# Patient Record
Sex: Male | Born: 2000
Health system: Southern US, Community
[De-identification: ages and names within clinical notes are randomized; demographics above are authoritative.]

## PROBLEM LIST (undated history)

## (undated) DIAGNOSIS — J4550 Severe persistent asthma, uncomplicated: Secondary | ICD-10-CM

## (undated) DIAGNOSIS — F259 Schizoaffective disorder, unspecified: Secondary | ICD-10-CM

## (undated) DIAGNOSIS — E669 Obesity, unspecified: Secondary | ICD-10-CM

## (undated) DIAGNOSIS — J309 Allergic rhinitis, unspecified: Secondary | ICD-10-CM

## (undated) DIAGNOSIS — J4540 Moderate persistent asthma, uncomplicated: Secondary | ICD-10-CM

## (undated) DIAGNOSIS — J351 Hypertrophy of tonsils: Secondary | ICD-10-CM

## (undated) DIAGNOSIS — R030 Elevated blood-pressure reading, without diagnosis of hypertension: Secondary | ICD-10-CM

## (undated) DIAGNOSIS — G4733 Obstructive sleep apnea (adult) (pediatric): Secondary | ICD-10-CM

## (undated) DIAGNOSIS — R7301 Impaired fasting glucose: Secondary | ICD-10-CM

## (undated) DIAGNOSIS — E66812 Obesity, class 2: Secondary | ICD-10-CM

## (undated) HISTORY — DX: Elevated blood-pressure reading, without diagnosis of hypertension: R03.0

## (undated) HISTORY — DX: Moderate persistent asthma, uncomplicated: J45.40

## (undated) HISTORY — DX: Allergic rhinitis, unspecified: J30.9

## (undated) HISTORY — DX: Obesity, class 2: E66.812

## (undated) HISTORY — DX: Impaired fasting glucose: R73.01

## (undated) HISTORY — DX: Obstructive sleep apnea (adult) (pediatric): G47.33

## (undated) HISTORY — DX: Severe persistent asthma, uncomplicated: J45.50

## (undated) HISTORY — DX: Hypertrophy of tonsils: J35.1

## (undated) HISTORY — DX: Obesity, unspecified: E66.9

## (undated) HISTORY — DX: Schizoaffective disorder, unspecified: F25.9

## (undated) NOTE — *Deleted (*Deleted)
Date:  03/17/2020 Time:  0900-1045 Group Topic/Focus: PROGRESSIVE RELAXATION. A group where deep breathing is taught and tensing and relaxation muscle groups is used. Imagery is used as well.  Pts are asked to imagine 3 pillars that hold them up when they are not able to hold themselves up.  Participation Level:  Active  Participation Quality:  Appropriate  Affect:  Appropriate  Cognitive:  Oriented  Insight: Improving  Engagement in Group:  Engaged  Modes of Intervention:  Activity, Discussion, Education, and Support  Additional Comments:  ***  Judge, Christine A 03/17/2020`  

---
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
## 2018-01-11 ENCOUNTER — Ambulatory Visit: Payer: 59 | Admitting: Allergy and Immunology

## 2018-01-11 DIAGNOSIS — J309 Allergic rhinitis, unspecified: Secondary | ICD-10-CM

## 2018-02-01 ENCOUNTER — Ambulatory Visit: Payer: 59 | Admitting: Neurology

## 2018-02-01 ENCOUNTER — Encounter: Payer: Self-pay | Admitting: Neurology

## 2018-02-01 VITALS — BP 142/87 | HR 95 | Ht 72.0 in | Wt 272.0 lb

## 2018-02-01 DIAGNOSIS — R51 Headache: Secondary | ICD-10-CM

## 2018-02-01 DIAGNOSIS — R519 Headache, unspecified: Secondary | ICD-10-CM

## 2018-02-01 DIAGNOSIS — R0683 Snoring: Secondary | ICD-10-CM | POA: Diagnosis not present

## 2018-02-01 DIAGNOSIS — G4733 Obstructive sleep apnea (adult) (pediatric): Secondary | ICD-10-CM

## 2018-02-01 DIAGNOSIS — E669 Obesity, unspecified: Secondary | ICD-10-CM

## 2018-02-01 DIAGNOSIS — J4520 Mild intermittent asthma, uncomplicated: Secondary | ICD-10-CM | POA: Diagnosis not present

## 2018-02-01 HISTORY — DX: Obstructive sleep apnea (adult) (pediatric): G47.33

## 2018-02-01 NOTE — Progress Notes (Addendum)
Subjective:    Patient ID: Nathaniel [name] is a 17 y.o. male.  HPI     Huston Foley, MD, PhD [facility] [address] [address] [place], [place] [address]  Dear Dr. Milinda Cave,   I saw your patient, Nathaniel [name], upon your kind request, in my sleep clinic for initial consultation of his sleep disorder, in particular, concern for underlying obstructive sleep apnea. The patient is unaccompanied today. As you know, [name] is a 17 year old right handed male with an underlying medical history of asthma, allergic rhinitis and obesity, who reports snoring and Difficulty with sleep initiation, some daytime tiredness reported. I reviewed your office note from 12/07/2017. His Epworth sleepiness score is 2 out of 24, fatigue score is 27/63. He is single, lives with his mother, works at [employer] as an Engineer, petroleum and has college classes Mondays, Wednesdays, and Fridays, 9-12 typically. He is a nonsmoker and does not utilize alcohol, drinks caffeine in the form of coffee, one cup per day on average, occasional tea, rare sodas. He has gained weight in the past couple of years. He has had very occasional mild her morning headaches, no history of migraines. He was diagnosed with asthma as a child. He has a family history of snoring but is not aware of any family history of OSA. He has had difficulty falling asleep for the past few years, maybe as long as 5 or 6 years even. Once he's asleep he is typically able to maintain sleep. He denies night to night nocturia or restless leg symptoms. He has tried melatonin and while it helps him go to sleep he has some daytime grogginess from it. He has a TV in his bedroom and typically falls asleep with his TV on. There are no pets in the household. He has no siblings. Bedtime may be around 11 or midnight, rise time varies depending on his work and class schedule. When he has to get up early for an appointment he feels  tired.  His Past Medical History Is Significant For: Past Medical History:  Diagnosis Date  . Allergic rhinitis   . Elevated blood pressure reading without diagnosis of hypertension   . IFG (impaired fasting glucose) 12/2017   Gluc 107, HbA1c 5.5%  . Moderate persistent asthma   . Obesity, Class II, BMI 35-39.9     His Past Surgical History Is Significant For: No past surgical history on file.  His Family History Is Significant For: Family History  Problem Relation Age of Onset  . Heart attack Other   . Prostate cancer Other   . Hypertension Father   . Hypertension Maternal Grandfather   . Cancer Maternal Grandfather   . Asthma Neg Hx     His Social History Is Significant For: Social History   Socioeconomic History  . Marital status: Single    Spouse name: Not on file  . Number of children: Not on file  . Years of education: Not on file  . Highest education level: Not on file  Occupational History  . Not on file  Social Needs  . Financial resource strain: Not on file  . Food insecurity:    Worry: Not on file    Inability: Not on file  . Transportation needs:    Medical: Not on file    Non-medical: Not on file  Tobacco Use  . Smoking status: Never Smoker  . Smokeless tobacco: Never Used  Substance and Sexual Activity  .  Alcohol use: No  . Drug use: No  . Sexual activity: Not on file  Lifestyle  . Physical activity:    Days per week: Not on file    Minutes per session: Not on file  . Stress: Not on file  Relationships  . Social connections:    Talks on phone: Not on file    Gets together: Not on file    Attends religious service: Not on file    Active member of club or organization: Not on file    Attends meetings of clubs or organizations: Not on file    Relationship status: Not on file  Other Topics Concern  . Not on file  Social History Narrative   Single, lives with mom in [place].   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   No T/A/Ds.    His Allergies Are:  Allergies  Allergen Reactions  . Amoxicillin Shortness Of Breath and Swelling  :   His Current Medications Are:  Outpatient Encounter Medications as of 02/01/2018  Medication Sig  . albuterol (PROVENTIL HFA;VENTOLIN HFA) 108 (90 BASE) MCG/ACT inhaler Inhale 2 puffs into the lungs every 6 (six) hours as needed. For wheezing or shortness of breath  . cetirizine (ZYRTEC) 10 MG tablet Take 10 mg by mouth daily.   . Cholecalciferol (VITAMIN D PO) Take 2,000 Units by mouth daily.  . Fluticasone Furoate (VERAMYST NA) Place 2 puffs into the nose daily as needed. For congestion  . Fluticasone-Salmeterol (ADVAIR DISKUS) 250-50 MCG/DOSE AEPB Inhale 1 puff into the lungs 2 (two) times daily.  . [DISCONTINUED] budesonide-formoterol (SYMBICORT) 160-4.5 MCG/ACT inhaler Inhale two puffs twice daily to prevent cough or wheeze. Rinse mouth after use.  . [DISCONTINUED] fluticasone furoate-vilanterol (BREO ELLIPTA) 100-25 MCG/INH AEPB Inhale 1 puff into the lungs daily.  . [DISCONTINUED] mometasone-formoterol (DULERA) 200-5 MCG/ACT AERO Inhale 2 puffs into the lungs 2 (two) times daily.   No facility-administered encounter medications on file as of 02/01/2018.   :  Review of Systems:  Out of a complete 14 point review of systems, all are reviewed and negative with the exception of these symptoms as listed below: Review of Systems  Neurological:       Pt presents today to discuss his sleep. Pt has never had a sleep study but does endorse snoring.  Epworth Sleepiness Scale 0= would never doze 1= slight chance of dozing 2= moderate chance of dozing 3= high chance of dozing  Sitting and reading: 0 Watching TV: 0 Sitting inactive in a public place (ex. Theater or meeting): 0 As a passenger in a car for an hour without a break: 1 Lying down to rest in the afternoon: 1 Sitting and talking to someone: 0 Sitting quietly after lunch (no alcohol): 0 In a  car, while stopped in traffic: 0 Total: 2     Objective:  Neurological Exam  Physical Exam Physical Examination:   Vitals:   02/01/18 1052  BP: (!) 142/87  Pulse: 95    General Examination: The patient is a very pleasant 17 y.o. male in no acute distress. He appears well-developed and well-nourished and well groomed.   HEENT: Normocephalic, atraumatic, pupils are equal, round and reactive to light and accommodation. Extraocular tracking is good without limitation to gaze excursion or nystagmus noted. Normal smooth pursuit is noted. Hearing is grossly intact. Face is symmetric with normal facial animation and normal facial sensation. Speech is clear with no dysarthria noted. There  is no hypophonia, in fact, speaks quite loudly. There is no lip, neck/head, jaw or voice tremor. Neck is supple with full range of passive and active motion. There are no carotid bruits on auscultation. Oropharynx exam reveals: no mouth dryness, adequate dental hygiene and moderate airway crowding, due to longer uvula, tonsillar size of 3+ on the right and 2+ on the left. Mallampati is class II. Neck circumference is 18-1/8 inches. Tongue protrudes centrally and palate elevates symmetrically.   Chest: Clear to auscultation without wheezing, rhonchi or crackles noted.  Heart: S1+S2+0, regular and normal without murmurs, rubs or gallops noted.   Abdomen: Soft, non-tender and non-distended with normal bowel sounds appreciated on auscultation.  Extremities: There is no pitting edema in the distal lower extremities bilaterally. Pedal pulses are intact.  Skin: Warm and dry without trophic changes noted. There are no varicose veins.  Musculoskeletal: exam reveals no obvious joint deformities, tenderness or joint swelling or erythema.   Neurologically:  Mental status: The patient is awake, alert and oriented in all 4 spheres. His immediate and remote memory, attention, language skills and fund of knowledge are  appropriate. There is no evidence of aphasia, agnosia, apraxia or anomia. Speech is clear with normal prosody and enunciation. Thought process is linear. Mood is normal and affect is normal.  Cranial nerves II - XII are as described above under HEENT exam. In addition: shoulder shrug is normal with equal shoulder height noted. Motor exam: Normal bulk, strength and tone is noted. There is no drift, tremor or rebound. Romberg is negative. Reflexes are 1-2+ throughout. Fine motor skills and coordination: intact with normal finger taps, normal hand movements, normal rapid alternating patting, normal foot taps and normal foot agility.  Cerebellar testing: No dysmetria or intention tremor on finger to nose testing. Heel to shin is unremarkable bilaterally. There is no truncal or gait ataxia.  Sensory exam: intact to light touch in the upper and lower extremities.  Gait, station and balance: He stands easily. No veering to one side is noted. No leaning to one side is noted. Posture is age-appropriate and stance is narrow based. Gait shows normal stride length and normal pace. No problems turning are noted. Tandem walk is unremarkable.   Assessment and Plan:  In summary, Jonluke Cobbins is a very pleasant 17 y.o.-year old male with an underlying medical history of asthma, allergic rhinitis and obesity, whose history and physical exam are concerning for obstructive sleep apnea (OSA). I had a long chat with the patient about my findings and the diagnosis of OSA, its prognosis and treatment options. We talked about medical treatments, surgical interventions and non-pharmacological approaches. I explained in particular the risks and ramifications of untreated moderate to severe OSA, especially with respect to developing cardiovascular disease down the Road, including congestive heart failure, difficult to treat hypertension, cardiac arrhythmias, or stroke. Even type 2 diabetes has, in part, been linked to untreated OSA.  Symptoms of untreated OSA include daytime sleepiness, memory problems, mood irritability and mood disorder such as depression and anxiety, lack of energy, as well as recurrent headaches, especially morning headaches. We talked about trying to maintain a healthy lifestyle in general, as well as the importance of weight control. I encouraged the patient to eat healthy, exercise daily and keep well hydrated, to keep a scheduled bedtime and wake time routine, to not skip any meals and eat healthy snacks in between meals. I advised the patient not to drive when feeling sleepy. I recommended the following at  this time: sleep study with potential positive airway pressure titration. (We will score hypopneas at 4%). He would like to think about it and then decide, would like to d/w his mother.  I explained the sleep test procedure to the patient and also outlined possible surgical and non-surgical treatment options of OSA. I answered all his questions today and the patient was in agreement. I plan to see him back after the sleep study is completed and encouraged him to call with any interim questions, concerns, problems or updates.   Thank you very much for allowing me to participate in the care of this nice patient. If I can be of any further assistance to you please do not hesitate to call me at [phone].  Sincerely,   Huston Foley, MD, PhD

## 2018-02-01 NOTE — Patient Instructions (Addendum)
Thank you for choosing [facility] for your sleep related care! It was nice to meet you today! I appreciate that you entrust me with your sleep related healthcare concerns. I hope, I was able to address at least some of your concerns today, and that I can help you feel reassured and also get better.    Here is what we discussed today and what we came up with as our plan for you:    Based on your symptoms and your exam I believe you may be at some risk for obstructive sleep apnea (aka OSA), and I think we should proceed with a sleep study to determine whether you do or do not have OSA and how severe it is. Even, if you have mild OSA, I may want you to consider treatment with CPAP, as treatment of even borderline or mild sleep apnea can result and improvement of symptoms such as sleep disruption, daytime sleepiness, nighttime bathroom breaks, restless leg symptoms, improvement of headache syndromes, even improved mood disorder.   Please remember, the long-term risks and ramifications of untreated moderate to severe obstructive sleep apnea are: increased Cardiovascular disease, including congestive heart failure, stroke, difficult to control hypertension, treatment resistant obesity, arrhythmias, especially irregular heartbeat commonly known as A. Fib. (atrial fibrillation); even type 2 diabetes has been linked to untreated OSA.   Sleep apnea can cause disruption of sleep and sleep deprivation in most cases, which, in turn, can cause recurrent headaches, problems with memory, mood, concentration, focus, and vigilance. Most people with untreated sleep apnea report excessive daytime sleepiness, which can affect their ability to drive. Please do not drive if you feel sleepy. Patients with sleep apnea developed difficulty initiating and maintaining sleep (aka insomnia).   Having sleep apnea may increase your risk for other sleep disorders, including involuntary behaviors sleep such as sleep  terrors, sleep talking, sleepwalking.    Having sleep apnea can also increase your risk for restless leg syndrome and leg movements at night.   Please note that untreated obstructive sleep apnea may carry additional perioperative morbidity. Patients with significant obstructive sleep apnea (typically, in the moderate to severe degree) should receive, if possible, perioperative PAP (positive airway pressure) therapy and the surgeons and particularly the anesthesiologists should be informed of the diagnosis and the severity of the sleep disordered breathing.   I will likely see you back after your sleep study to go over the test results and where to go from there. We will call you after your sleep study to advise about the results (most likely, you will hear from Kristen, my nurse) and to set up an appointment at the time, as necessary.    Our sleep lab administrative assistant will call you to schedule your sleep study and give you further instructions, regarding the check in process for the sleep study, arrival time, what to bring, when you can expect to leave after the study, etc., and to answer any other logistical questions you may have. If you don't hear back from her by about 2 weeks from now, please feel free to call her direct line at [phone] or you can call our general clinic number, or email us through My Chart.   

## 2018-02-02 ENCOUNTER — Telehealth: Payer: Self-pay

## 2018-02-02 DIAGNOSIS — R0683 Snoring: Secondary | ICD-10-CM

## 2018-02-02 NOTE — Telephone Encounter (Signed)
[employer] will deny in lab sleep study, does not meet criteria. Need HST order

## 2018-02-02 NOTE — Telephone Encounter (Signed)
VO for HST from Dr. Athar received. HST order placed.  

## 2018-02-03 ENCOUNTER — Encounter: Payer: Self-pay | Admitting: Family Medicine

## 2018-02-27 ENCOUNTER — Encounter: Payer: Self-pay | Admitting: Family Medicine

## 2018-02-27 DIAGNOSIS — G4733 Obstructive sleep apnea (adult) (pediatric): Secondary | ICD-10-CM

## 2018-02-27 DIAGNOSIS — J351 Hypertrophy of tonsils: Secondary | ICD-10-CM

## 2018-02-27 DIAGNOSIS — R0683 Snoring: Secondary | ICD-10-CM

## 2018-02-28 NOTE — Telephone Encounter (Signed)
Please advise. Thanks.  

## 2018-03-01 ENCOUNTER — Encounter: Payer: Self-pay | Admitting: Family Medicine

## 2018-03-01 NOTE — Telephone Encounter (Signed)
I've ordered a referral to ENT for this problem.-thx

## 2018-03-22 DIAGNOSIS — R0981 Nasal congestion: Secondary | ICD-10-CM | POA: Diagnosis not present

## 2018-03-22 DIAGNOSIS — J343 Hypertrophy of nasal turbinates: Secondary | ICD-10-CM | POA: Insufficient documentation

## 2018-03-22 DIAGNOSIS — G4733 Obstructive sleep apnea (adult) (pediatric): Secondary | ICD-10-CM | POA: Insufficient documentation

## 2018-03-22 DIAGNOSIS — J351 Hypertrophy of tonsils: Secondary | ICD-10-CM | POA: Diagnosis not present

## 2018-04-12 ENCOUNTER — Telehealth: Payer: Self-pay

## 2018-04-12 NOTE — Telephone Encounter (Signed)
We have attempted to call the patient two times to schedule sleep study.  Patient has been unavailable at the phone numbers we have on file and has not returned our calls.  At this point we will send a letter asking patient to please contact the sleep lab to schedule their sleep study.  If patient calls back we will schedule them for their sleep study. 

## 2018-04-14 ENCOUNTER — Other Ambulatory Visit: Payer: Self-pay | Admitting: Allergy and Immunology

## 2018-04-17 ENCOUNTER — Encounter: Payer: Self-pay | Admitting: Family Medicine

## 2018-04-20 ENCOUNTER — Other Ambulatory Visit (HOSPITAL_COMMUNITY)
Admission: RE | Admit: 2018-04-20 | Discharge: 2018-04-20 | Disposition: A | Discharge status: Home / Self Care | Payer: 59 | Source: Ambulatory Visit | Attending: Family Medicine | Admitting: Family Medicine

## 2018-04-20 ENCOUNTER — Encounter: Payer: Self-pay | Admitting: Family Medicine

## 2018-04-20 ENCOUNTER — Ambulatory Visit: Payer: 59 | Admitting: Family Medicine

## 2018-04-20 VITALS — BP 121/76 | HR 101 | Temp 98.3°F | Resp 16 | Ht 72.0 in | Wt 279.0 lb

## 2018-04-20 DIAGNOSIS — G4733 Obstructive sleep apnea (adult) (pediatric): Secondary | ICD-10-CM | POA: Diagnosis not present

## 2018-04-20 DIAGNOSIS — Z7251 High risk heterosexual behavior: Secondary | ICD-10-CM

## 2018-04-20 NOTE — Progress Notes (Signed)
OFFICE VISIT  04/20/2018   CC:  Chief Complaint  Patient presents with  . Testing for STDs    HPI:    Patient is a 17 y.o.  male who presents for STD testing. Has had only one unprotected sexual encounter and wants to be sure. Male partner.  She had no s/s of STD and denied hx of STD. He has had some intermittent pain in tip of penis when urinating but not consistently. No discharge.  No GU lesions or LAD.  Past Medical History:  Diagnosis Date  . Allergic rhinitis   . Chronic tonsillar hypertrophy    Referred to ENT 03/01/18  . Elevated blood pressure reading without diagnosis of hypertension   . IFG (impaired fasting glucose) 12/2017   Gluc 107, HbA1c 5.5%  . Moderate persistent asthma   . Obesity, Class II, BMI 35-39.9   . OSA (obstructive sleep apnea) 02/2018   Sleep study recommended by Dr. Ivory BroadAthar;pt considering it.    History reviewed. No pertinent surgical history.  Outpatient Medications Prior to Visit  Medication Sig Dispense Refill  . ADVAIR DISKUS 250-50 MCG/DOSE AEPB TAKE 1 PUFF BY MOUTH TWICE A DAY 60 each 0  . albuterol (PROVENTIL HFA;VENTOLIN HFA) 108 (90 BASE) MCG/ACT inhaler Inhale 2 puffs into the lungs every 6 (six) hours as needed. For wheezing or shortness of breath    . cetirizine (ZYRTEC) 10 MG tablet Take 10 mg by mouth daily.     . Cholecalciferol (VITAMIN D PO) Take 2,000 Units by mouth daily.    . mometasone (NASONEX) 50 MCG/ACT nasal spray Place 1 spray into the nose daily.    . Fluticasone Furoate (VERAMYST NA) Place 2 puffs into the nose daily as needed. For congestion     No facility-administered medications prior to visit.     Allergies  Allergen Reactions  . Amoxicillin Shortness Of Breath and Swelling    ROS As per HPI  PE: Blood pressure 121/76, pulse (!) 101, temperature 98.3 F (36.8 C), temperature source Oral, resp. rate 16, height 6' (1.829 m), weight 279 lb (126.6 kg), SpO2 97 %. Gen: Alert, well appearing.  Patient  is oriented to person, place, time, and situation. AFFECT: pleasant, lucid thought and speech. No further exam today.  LABS:    Chemistry      Component Value Date/Time   NA 142 12/07/2017 1030   K 4.4 12/07/2017 1030   CL 109 12/07/2017 1030   CO2 26 12/07/2017 1030   BUN 14 12/07/2017 1030   CREATININE 0.80 12/07/2017 1030   CREATININE 0.71 08/27/2014 1624      Component Value Date/Time   CALCIUM 9.9 12/07/2017 1030   ALKPHOS 76 12/07/2017 1030   AST 21 12/07/2017 1030   ALT 39 12/07/2017 1030   BILITOT 0.4 12/07/2017 1030     Lab Results  Component Value Date   HGBA1C 5.5 12/08/2017   Lab Results  Component Value Date   WBC 11.6 12/07/2017   HGB 14.5 12/07/2017   HCT 42.5 12/07/2017   MCV 88.4 12/07/2017   PLT 276.0 12/07/2017    IMPRESSION AND PLAN:  Unprotected sex. Pt states he was a virgin prior to this incident. He desires STD testing so I ordered urine GC/Chlamydia, RPR, and HIV. Recommended he use condom for protection at every sexual encounter.  An After Visit Summary was printed and given to the patient.  FOLLOW UP: Return for as needed.  Signed:  Santiago BumpersPhil Abbi Mancini, MD  04/20/2018     

## 2018-04-21 LAB — URINE CYTOLOGY ANCILLARY ONLY
Chlamydia: NEGATIVE
Neisseria Gonorrhea: NEGATIVE

## 2018-04-21 LAB — HIV ANTIBODY (ROUTINE TESTING W REFLEX): HIV 1&2 Ab, 4th Generation: NONREACTIVE

## 2018-04-21 LAB — RPR: RPR Ser Ql: NONREACTIVE

## 2018-04-22 ENCOUNTER — Encounter: Payer: Self-pay | Admitting: *Deleted

## 2018-05-12 ENCOUNTER — Encounter: Payer: Self-pay | Admitting: Family Medicine

## 2018-05-12 ENCOUNTER — Encounter: Payer: Self-pay | Admitting: *Deleted

## 2018-05-12 ENCOUNTER — Ambulatory Visit: Payer: 59 | Admitting: Family Medicine

## 2018-05-12 VITALS — BP 131/85 | HR 105 | Temp 99.7°F | Resp 16 | Ht 72.0 in | Wt 284.0 lb

## 2018-05-12 DIAGNOSIS — J209 Acute bronchitis, unspecified: Secondary | ICD-10-CM | POA: Diagnosis not present

## 2018-05-12 DIAGNOSIS — J111 Influenza due to unidentified influenza virus with other respiratory manifestations: Secondary | ICD-10-CM

## 2018-05-12 DIAGNOSIS — R6889 Other general symptoms and signs: Secondary | ICD-10-CM

## 2018-05-12 DIAGNOSIS — R69 Illness, unspecified: Principal | ICD-10-CM

## 2018-05-12 LAB — POC INFLUENZA A&B (BINAX/QUICKVUE)
INFLUENZA A, POC: NEGATIVE
INFLUENZA B, POC: NEGATIVE

## 2018-05-12 LAB — POCT RAPID STREP A (OFFICE): Rapid Strep A Screen: NEGATIVE

## 2018-05-12 MED ORDER — PREDNISONE 20 MG PO TABS: ORAL_TABLET | ORAL | 0 refills | Status: DC

## 2018-05-12 NOTE — Patient Instructions (Signed)
Get otc generic robitussin DM OR Mucinex DM and use as directed on the packaging for cough and congestion. Use otc generic saline nasal spray 2-3 times per day to irrigate/moisturize your nasal passages.  Drink lots of clear fluids!

## 2018-05-12 NOTE — Progress Notes (Signed)
OFFICE VISIT  05/12/2018   CC:  Chief Complaint  Patient presents with  . URI    cough, sore throat, congestion, fever, chills   HPI:    Patient is a 18 y.o.  male with moderate persistent asthma who presents for respiratory complaints. Onset f/c/malaise/fatigue 3 d/a.  This all subsided the next day, but he started getting nasal congestion, ST, runny nose, HA.  He then began getting a lot of coughing.  Some nausea but no vomiting.  No diarrhea.  No rash. Drinking fluids well.  No known sick contacts. Has taken "cold and flu" med otc, + ibup.  He has a tonsillectomy scheduled for tomorrow that he'll have to cancel.   Past Medical History:  Diagnosis Date  . Allergic rhinitis   . Chronic tonsillar hypertrophy    Referred to ENT 03/01/18  . Elevated blood pressure reading without diagnosis of hypertension   . IFG (impaired fasting glucose) 12/2017   Gluc 107, HbA1c 5.5%  . Moderate persistent asthma   . Obesity, Class II, BMI 35-39.9   . OSA (obstructive sleep apnea) 02/2018   Sleep study recommended by Dr. Ivory Broad considering it.    History reviewed. No pertinent surgical history.  Outpatient Medications Prior to Visit  Medication Sig Dispense Refill  . ADVAIR DISKUS 250-50 MCG/DOSE AEPB TAKE 1 PUFF BY MOUTH TWICE A DAY 60 each 0  . albuterol (PROVENTIL HFA;VENTOLIN HFA) 108 (90 BASE) MCG/ACT inhaler Inhale 2 puffs into the lungs every 6 (six) hours as needed. For wheezing or shortness of breath    . cetirizine (ZYRTEC) 10 MG tablet Take 10 mg by mouth daily.     . Cholecalciferol (VITAMIN D PO) Take 2,000 Units by mouth daily.    . mometasone (NASONEX) 50 MCG/ACT nasal spray Place 1 spray into the nose daily.     No facility-administered medications prior to visit.     Allergies  Allergen Reactions  . Amoxicillin Shortness Of Breath and Swelling    ROS As per HPI  PE: Blood pressure 131/85, pulse (!) 105, temperature 99.7 F (37.6 C), temperature source Oral,  resp. rate 16, height 6' (1.829 m), weight 284 lb (128.8 kg), SpO2 98 %. Body mass index is 38.52 kg/m.  VS: noted--normal. Gen: alert, NAD, NONTOXIC APPEARING. HEENT: eyes without injection, drainage, or swelling.  Ears: EACs clear, TMs with normal light reflex and landmarks.  Nose: Clear rhinorrhea, with some dried, crusty exudate adherent to mildly injected mucosa.  No purulent d/c.  No paranasal sinus TTP.  No facial swelling.  Throat and mouth without focal lesion.  No pharyngial swelling, erythema, or exudate.   Neck: supple, no LAD.   LUNGS: CTA bilat, nonlabored resps.  With forced exp maneuver he has coarse rhonchi.  Aeration is good. CV: RRR, no m/r/g. EXT: no c/c/e SKIN: no rash   LABS:    Chemistry      Component Value Date/Time   NA 142 12/07/2017 1030   K 4.4 12/07/2017 1030   CL 109 12/07/2017 1030   CO2 26 12/07/2017 1030   BUN 14 12/07/2017 1030   CREATININE 0.80 12/07/2017 1030   CREATININE 0.71 08/27/2014 1624      Component Value Date/Time   CALCIUM 9.9 12/07/2017 1030   ALKPHOS 76 12/07/2017 1030   AST 21 12/07/2017 1030   ALT 39 12/07/2017 1030   BILITOT 0.4 12/07/2017 1030     Strep today: neg Flu A/B today: neg  IMPRESSION AND PLAN:  Influenza-like  illness, flu and strep neg. He has some RAD-->prednisone 40mg  qd x 5d rx'd today. Albut hfa 2p q4h. Get otc generic robitussin DM OR Mucinex DM and use as directed on the packaging for cough and congestion. Use otc generic saline nasal spray 2-3 times per day to irrigate/moisturize your nasal passages. Push fluids, rest. Signs/symptoms to call or return for were reviewed and pt expressed understanding.  An After Visit Summary was printed and given to the patient.  FOLLOW UP: Return if symptoms worsen or fail to improve.  Signed:  Santiago Bumpers, MD           05/12/2018

## 2018-06-03 ENCOUNTER — Other Ambulatory Visit: Payer: Self-pay | Admitting: Otolaryngology

## 2018-06-03 DIAGNOSIS — G4733 Obstructive sleep apnea (adult) (pediatric): Secondary | ICD-10-CM | POA: Diagnosis not present

## 2018-06-03 DIAGNOSIS — J3501 Chronic tonsillitis: Secondary | ICD-10-CM | POA: Diagnosis not present

## 2018-06-03 HISTORY — PX: TONSILLECTOMY: SUR1361

## 2018-06-23 DIAGNOSIS — J3089 Other allergic rhinitis: Secondary | ICD-10-CM | POA: Diagnosis not present

## 2018-07-12 ENCOUNTER — Telehealth: Payer: Self-pay

## 2018-07-12 ENCOUNTER — Other Ambulatory Visit: Payer: Self-pay | Admitting: *Deleted

## 2018-07-12 MED ORDER — FLUTICASONE-SALMETEROL 250-50 MCG/DOSE IN AEPB: INHALATION_SPRAY | RESPIRATORY_TRACT | 0 refills | Status: DC

## 2018-07-12 NOTE — Telephone Encounter (Signed)
A courtesy refill has been sent in for Advair to requested pharmacy. Called and informed mother, mother verbalized understanding and will ensure that Kenet makes it to his appointment.

## 2018-07-12 NOTE — Telephone Encounter (Signed)
Patients mom is calling to see if she can get a refill on her sons Advair. She made a follow up for 08/02/18 with Dr Lucie Leather.   [facility] [address]

## 2018-07-19 ENCOUNTER — Other Ambulatory Visit: Payer: Self-pay

## 2018-07-19 ENCOUNTER — Encounter: Payer: Self-pay | Admitting: Family Medicine

## 2018-07-19 ENCOUNTER — Ambulatory Visit: Payer: 59 | Admitting: Family Medicine

## 2018-07-19 VITALS — BP 143/64 | HR 99 | Temp 98.3°F | Resp 19 | Ht 72.0 in | Wt 289.2 lb

## 2018-07-19 DIAGNOSIS — J4531 Mild persistent asthma with (acute) exacerbation: Secondary | ICD-10-CM | POA: Diagnosis not present

## 2018-07-19 DIAGNOSIS — R062 Wheezing: Secondary | ICD-10-CM

## 2018-07-19 MED ORDER — ALBUTEROL SULFATE HFA 108 (90 BASE) MCG/ACT IN AERS: 2.0000 | INHALATION_SPRAY | Freq: Four times a day (QID) | RESPIRATORY_TRACT | 0 refills | Status: DC | PRN

## 2018-07-19 MED ORDER — AZITHROMYCIN 250 MG PO TABS: ORAL_TABLET | ORAL | 0 refills | Status: DC

## 2018-07-19 MED ORDER — IPRATROPIUM-ALBUTEROL 0.5-2.5 (3) MG/3ML IN SOLN
3.0000 mL | Freq: Once | RESPIRATORY_TRACT | Status: AC
  Administered 2018-07-19: 3 mL via RESPIRATORY_TRACT

## 2018-07-19 MED ORDER — PREDNISONE 20 MG PO TABS: ORAL_TABLET | ORAL | 0 refills | Status: DC

## 2018-07-19 NOTE — Patient Instructions (Signed)
Rest, hydrate.  + flonase, mucinex DM if cough Z-pack and prednisone  prescribed, take until completed.  Continue advair- refilled albuterol (use every 6 hr 1-2 puffs as needed during illness) If cough present it can last up to 6-8 weeks.  F/U 2 weeks of not improved.    Asthma, Adult  Asthma is a long-term (chronic) condition in which the airways get tight and narrow. The airways are the breathing passages that lead from the nose and mouth down into the lungs. A person with asthma will have times when symptoms get worse. These are called asthma attacks. They can cause coughing, whistling sounds when you breathe (wheezing), shortness of breath, and chest pain. They can make it hard to breathe. There is no cure for asthma, but medicines and lifestyle changes can help control it. There are many things that can bring on an asthma attack or make asthma symptoms worse (triggers). Common triggers include:  Mold.  Dust.  Cigarette smoke.  Cockroaches.  Things that can cause allergy symptoms (allergens). These include animal skin flakes (dander) and pollen from trees or grass.  Things that pollute the air. These may include household cleaners, wood smoke, smog, or chemical odors.  Cold air, weather changes, and wind.  Crying or laughing hard.  Stress.  Certain medicines or drugs.  Certain foods such as dried fruit, potato chips, and grape juice.  Infections, such as a cold or the flu.  Certain medical conditions or diseases.  Exercise or tiring activities. Asthma may be treated with medicines and by staying away from the things that cause asthma attacks. Types of medicines may include:  Controller medicines. These help prevent asthma symptoms. They are usually taken every day.  Fast-acting reliever or rescue medicines. These quickly relieve asthma symptoms. They are used as needed and provide short-term relief.  Allergy medicines if your attacks are brought on by allergens.   Medicines to help control the body's defense (immune) system. Follow these instructions at home: Avoiding triggers in your home  Change your heating and air conditioning filter often.  Limit your use of fireplaces and wood stoves.  Get rid of pests (such as roaches and mice) and their droppings.  Throw away plants if you see mold on them.  Clean your floors. Dust regularly. Use cleaning products that do not smell.  Have someone vacuum when you are not home. Use a vacuum cleaner with a HEPA filter if possible.  Replace carpet with wood, tile, or vinyl flooring. Carpet can trap animal skin flakes and dust.  Use allergy-proof pillows, mattress covers, and box spring covers.  Wash bed sheets and blankets every week in hot water. Dry them in a dryer.  Keep your bedroom free of any triggers.  Avoid pets and keep windows closed when things that cause allergy symptoms are in the air.  Use blankets that are made of polyester or cotton.  Clean bathrooms and kitchens with bleach. If possible, have someone repaint the walls in these rooms with mold-resistant paint. Keep out of the rooms that are being cleaned and painted.  Wash your hands often with soap and water. If soap and water are not available, use hand sanitizer.  Do not allow anyone to smoke in your home. General instructions  Take over-the-counter and prescription medicines only as told by your doctor. ? Talk with your doctor if you have questions about how or when to take your medicines. ? Make note if you need to use your medicines more often than  usual.  Do not use any products that contain nicotine or tobacco, such as cigarettes and e-cigarettes. If you need help quitting, ask your doctor.  Stay away from secondhand smoke.  Avoid doing things outdoors when allergen counts are high and when air quality is low.  Wear a ski mask when doing outdoor activities in the winter. The mask should cover your nose and mouth.  Exercise indoors on cold days if you can.  Warm up before you exercise. Take time to cool down after exercise.  Use a peak flow meter as told by your doctor. A peak flow meter is a tool that measures how well the lungs are working.  Keep track of the peak flow meter's readings. Write them down.  Follow your asthma action plan. This is a written plan for taking care of your asthma and treating your attacks.  Make sure you get all the shots (vaccines) that your doctor recommends. Ask your doctor about a flu shot and a pneumonia shot.  Keep all follow-up visits as told by your doctor. This is important. Contact a doctor if:  You have wheezing, shortness of breath, or a cough even while taking medicine to prevent attacks.  The mucus you cough up (sputum) is thicker than usual.  The mucus you cough up changes from clear or white to yellow, green, gray, or bloody.  You have problems from the medicine you are taking, such as: ? A rash. ? Itching. ? Swelling. ? Trouble breathing.  You need reliever medicines more than 2-3 times a week.  Your peak flow reading is still at 50-79% of your personal best after following the action plan for 1 hour.  You have a fever. Get help right away if:  You seem to be worse and are not responding to medicine during an asthma attack.  You are short of breath even at rest.  You get short of breath when doing very little activity.  You have trouble eating, drinking, or talking.  You have chest pain or tightness.  You have a fast heartbeat.  Your lips or fingernails start to turn blue.  You are light-headed or dizzy, or you faint.  Your peak flow is less than 50% of your personal best.  You feel too tired to breathe normally. Summary  Asthma is a long-term (chronic) condition in which the airways get tight and narrow. An asthma attack can make it hard to breathe.  Asthma cannot be cured, but medicines and lifestyle changes can help control  it.  Make sure you understand how to avoid triggers and how and when to use your medicines. This information is not intended to replace advice given to you by your health care provider. Make sure you discuss any questions you have with your health care provider. Document Released: 10/07/2007 Document Revised: 05/25/2016 Document Reviewed: 05/25/2016 Elsevier Interactive Patient Education  2019 [employer].

## 2018-07-19 NOTE — Progress Notes (Signed)
Nathaniel [name] , 06/21/2000, 18 y.o., male MRN: [id number] Patient Care Team    Relationship Specialty Notifications Start End  McGowen, Maryjean Morn, MD PCP - General Family Medicine  09/06/17   Jessica Priest, MD Consulting Physician Allergy and Immunology  09/06/17   Alfonse Spruce, MD Consulting Physician Allergy and Immunology  09/30/17   Huston Foley, MD Consulting Physician Neurology  02/03/18     Chief Complaint  Patient presents with  . Cough    x2 days, Denies fever, SOB, travel   . Sore Throat     Subjective: Pt presents for an OV with complaints of sore throat and cough of 2 days duration.  Associated symptoms include runny nose, cough is dry. He has a h/o asthma- he has not used his inhaler. He endorses headache, ear pressure.  Flu shot UTD 12/2017. He denies any travel or exposure to COVID19.  He denies fever, chills, nausea or vomit.  Pt has tried nothing to ease their symptoms.   Depression screen [facility] 2/9 12/07/2017 07/13/2017  Decreased Interest 0 0  Down, Depressed, Hopeless 0 0  PHQ - 2 Score 0 0    Allergies  Allergen Reactions  . Amoxicillin Shortness Of Breath and Swelling   Social History   Social History Narrative   Single, lives with mom in [place].   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   No T/A/Ds.   Past Medical History:  Diagnosis Date  . Allergic rhinitis   . Chronic tonsillar hypertrophy    Referred to ENT 03/01/18  . Elevated blood pressure reading without diagnosis of hypertension   . IFG (impaired fasting glucose) 12/2017   Gluc 107, HbA1c 5.5%  . Moderate persistent asthma   . Obesity, Class II, BMI 35-39.9   . OSA (obstructive sleep apnea) 02/2018   Sleep study recommended by Dr. Ivory Broad considering it.   History reviewed. No pertinent surgical history. Family History  Problem Relation Age of Onset  . Heart attack Other   . Prostate cancer Other   . Hypertension Father   . Hypertension Maternal  Grandfather   . Cancer Maternal Grandfather   . Asthma Neg Hx    Allergies as of 07/19/2018      Reactions   Amoxicillin Shortness Of Breath, Swelling      Medication List       Accurate as of July 19, 2018  5:17 PM. Always use your most recent med list.        albuterol 108 (90 Base) MCG/ACT inhaler Commonly known as:  PROVENTIL HFA;VENTOLIN HFA Inhale 2 puffs into the lungs every 6 (six) hours as needed. For wheezing or shortness of breath   azithromycin 250 MG tablet Commonly known as:  ZITHROMAX 500 mg day 1, then 250 mg QD   cetirizine 10 MG tablet Commonly known as:  ZYRTEC Take 10 mg by mouth daily.   Fluticasone-Salmeterol 250-50 MCG/DOSE Aepb Commonly known as:  Advair Diskus TAKE 1 PUFF BY MOUTH TWICE A DAY   predniSONE 20 MG tablet Commonly known as:  DELTASONE 2 tabs po qd x 5d   VITAMIN D PO Take 2,000 Units by mouth daily.       All past medical history, surgical history, allergies, family history, immunizations andmedications were updated in the EMR today and reviewed under the history and medication portions of their EMR.     ROS: Negative, with the exception of above mentioned in HPI  Objective:  BP (!) 143/64 (BP Location: Right Arm, Patient Position: Sitting, Cuff Size: Large)   Pulse 99   Temp 98.3 F (36.8 C) (Oral)   Resp 19   Ht 6' (1.829 m)   Wt 289 lb 4 oz (131.2 kg)   SpO2 99%   BMI 39.23 kg/m  Body mass index is 39.23 kg/m. Gen: Afebrile. No acute distress. Nontoxic in appearance, well developed, well nourished.  HENT: AT. [place]. Bilateral TM visualized without erythema or fullness. MMM, no oral lesions. Bilateral nares mild erythema, no swelling or drainage. Throat without erythema or exudates.  Cough present. Eyes:Pupils Equal Round Reactive to light, Extraocular movements intact,  Conjunctiva without redness, discharge or icterus. Neck/lymp/endocrine: Supple, no lymphadenopathy CV: RRR  Chest: mild wheeze bilaterally, no  crackles. Good air movement, normal resp effort.  Skin: no rashes, purpura or petechiae.  Neuro:  Normal gait. PERLA. EOMi. Alert. Oriented x3  No exam data present No results found. No results found for this or any previous visit (from the past 24 hour(s)).  Assessment/Plan: Torrin Kalicki is a 18 y.o. male present for OV for  Mild persistent asthma with exacerbation/Wheezing Rest, hydrate.  duoneb tx in office was helpful.  + flonase, mucinex DM if cough Z-pack and prednisone  prescribed, take until completed.  Continue advair- refilled albuterol (use every 6 hr 1-2 puffs as needed during illness) If cough present it can last up to 6-8 weeks.  F/U 2 weeks of not improved.    Reviewed expectations re: course of current medical issues.  Discussed self-management of symptoms.  Outlined signs and symptoms indicating need for more acute intervention.  Patient verbalized understanding and all questions were answered.  Patient received an After-Visit Summary.    No orders of the defined types were placed in this encounter.   > 25 minutes spent with patient, >50% of time spent face to face    Note is dictated utilizing voice recognition software. Although note has been proof read prior to signing, occasional typographical errors still can be missed. If any questions arise, please do not hesitate to call for verification.   electronically signed by:  Felix Pacini, DO   Primary Care - [place]

## 2018-08-02 ENCOUNTER — Encounter: Payer: Self-pay | Admitting: Allergy and Immunology

## 2018-08-02 ENCOUNTER — Other Ambulatory Visit: Payer: Self-pay

## 2018-08-02 ENCOUNTER — Ambulatory Visit: Payer: 59 | Admitting: Allergy and Immunology

## 2018-08-02 VITALS — BP 138/80 | HR 112 | Temp 98.2°F | Resp 18

## 2018-08-02 DIAGNOSIS — J4550 Severe persistent asthma, uncomplicated: Secondary | ICD-10-CM | POA: Diagnosis not present

## 2018-08-02 DIAGNOSIS — B349 Viral infection, unspecified: Secondary | ICD-10-CM | POA: Diagnosis not present

## 2018-08-02 DIAGNOSIS — J3089 Other allergic rhinitis: Secondary | ICD-10-CM

## 2018-08-02 NOTE — Patient Instructions (Addendum)
  1. Continue Advair 250- 1 (not two) - 2 times per day    2. Continue ProAir HFA 2 puffs every 4-6 hours if needed  3. Continue nasal fluticasone 1 spray each nostril 1-2 times a day during periods of upper airway symptoms  4. Continue Zyrtec 10 mg once a day if needed  5.  For this most recent episode utilize the following:   A.  OTC Mucinex DM 1-2 tablets 1-2 times per day (MAX = 4 tabs / day)  B.  Add Sample Spiriva 1.25 Respimat -2 inhalations 1 time per day  C. Add SAMPLE Qvar 80 redihaler - 2 inhalations 2 times per day  5. Return to clinic in 6 months or earlier if problem

## 2018-08-02 NOTE — Progress Notes (Signed)
[place] [facility] [place]   Follow-up Note  Referring Provider: Jeoffrey Massed, MD Primary Provider: Jeoffrey Massed, MD Date of Office Visit: 08/02/2018  Subjective:   Nathaniel [name] (DOB: 2000-11-07) is a 18 y.o. male who [facility] in re-evaluation of the following:  HPI: Nathaniel [name] presents to this clinic in evaluation of his asthma and allergic rhinitis.  I have not seen him in this clinic since 20 July 2017.  According to Nathaniel [name] he believes that his asthma has been under very good control.  Apparently he did develop a flulike illness back in January 2020 but otherwise felt as though his asthma rarely cause him any problem and did not require systemic steroid or an antibiotic to treat an exacerbation and rarely used a short acting bronchodilator.  Likewise, he had very little issues with his upper airways.  However, about 2 weeks ago he developed a sore throat followed by a cough for which he visited his doctor who told him that he had a virus and gave him a Z-Pak and prednisone.  He never completely resolved this issue and in fact he had bad body aches about 3 to 4 days ago which fortunately appears to have resolved today.  His coughing is actually better today.  He still has a little bit of shortness of breath.  He does not have any chest pain or sputum production.  He has very little upper airway symptoms at this point in time.  He does not have a sore throat at this point in time.  He did use a short acting bronchodilator the past several days which has helped him.  He has not really been around people who have been ill.  He uses Advair but there is some confusion about exactly how much he is using.  Apparently he is using a discus but he will increase his dose to 2 inhalations twice a day at times.  He has been on [place] and various other inhalers but because of insurance issues it is been very difficult  to have him use a consistent controller agent.  Allergies as of 08/02/2018      Reactions   Amoxicillin Shortness Of Breath, Swelling      Medication List      albuterol 108 (90 Base) MCG/ACT inhaler Commonly known as:  PROVENTIL HFA;VENTOLIN HFA Inhale 2 puffs into the lungs every 6 (six) hours as needed. For wheezing or shortness of breath   cetirizine 10 MG tablet Commonly known as:  ZYRTEC Take 10 mg by mouth daily.   Fluticasone-Salmeterol 250-50 MCG/DOSE Aepb Commonly known as:  Advair Diskus TAKE 1 PUFF BY MOUTH TWICE A DAY   VITAMIN D PO Take 2,000 Units by mouth daily.       Past Medical History:  Diagnosis Date  . Allergic rhinitis   . Chronic tonsillar hypertrophy    Referred to ENT 03/01/18  . Elevated blood pressure reading without diagnosis of hypertension   . IFG (impaired fasting glucose) 12/2017   Gluc 107, HbA1c 5.5%  . Moderate persistent asthma   . Obesity, Class II, BMI 35-39.9   . OSA (obstructive sleep apnea) 02/2018   Sleep study recommended by Dr. Ivory Broad considering it.    History reviewed. No pertinent surgical history.  Review of systems negative except as noted in HPI / PMHx or noted below:  Review of Systems  Constitutional: Negative.  HENT: Negative.   Eyes: Negative.   Respiratory: Negative.   Cardiovascular: Negative.   Gastrointestinal: Negative.   Genitourinary: Negative.   Musculoskeletal: Negative.   Skin: Negative.   Neurological: Negative.   Endo/Heme/Allergies: Negative.   Psychiatric/Behavioral: Negative.      Objective:   Vitals:   08/02/18 0939  BP: 138/80  Pulse: (!) 112  Resp: 18  Temp: 98.2 F (36.8 C)  SpO2: 96%          Physical Exam Constitutional:      Appearance: He is not diaphoretic.  HENT:     Head: Normocephalic.     Right Ear: Tympanic membrane, ear canal and external ear normal.     Left Ear: Tympanic membrane, ear canal and external ear normal.     Nose: Nose normal. No  mucosal edema or rhinorrhea.     Mouth/Throat:     Pharynx: Uvula midline. No oropharyngeal exudate.  Eyes:     Conjunctiva/sclera: Conjunctivae normal.  Neck:     Thyroid: No thyromegaly.     Trachea: Trachea normal. No tracheal tenderness or tracheal deviation.  Cardiovascular:     Rate and Rhythm: Normal rate and regular rhythm.     Heart sounds: Normal heart sounds, S1 normal and S2 normal. No murmur.  Pulmonary:     Effort: No respiratory distress.     Breath sounds: Normal breath sounds. No stridor. No wheezing or rales.  Lymphadenopathy:     Head:     Right side of head: No tonsillar adenopathy.     Left side of head: No tonsillar adenopathy.     Cervical: No cervical adenopathy.  Skin:    Findings: No erythema or rash.     Nails: There is no clubbing.   Neurological:     Mental Status: He is alert.     Diagnostics:    Spirometry was not performed  Assessment and Plan:   1. Not well controlled severe persistent asthma   2. Other allergic rhinitis   3. Viral infection     1. Continue Advair 250- 1 (not two) - 2 times per day    2. Continue ProAir HFA 2 puffs every 4-6 hours if needed  3. Continue nasal fluticasone 1 spray each nostril 1-2 times a day during periods of upper airway symptoms  4. Continue Zyrtec 10 mg once a day if needed  5.  For this most recent episode utilize the following:   A.  OTC Mucinex DM 1-2 tablets 1-2 times per day (MAX = 4 tabs / day)  B.  Add Sample Spiriva 1.25 Respimat -2 inhalations 1 time per day  C. Add SAMPLE Qvar 80 redihaler - 2 inhalations 2 times per day  5. Return to clinic in 6 months or earlier if problem  Nathaniel [name] appears to have inflammation of his airway most likely from a viral infection but fortunately he is not febrile at this point in time and he has improved significantly over the course of the past 24 to 48 hours.  I will give him additional anti-inflammatory agents for his airway as noted above assuming that  he will resolve this issue over the course of the next several weeks and continue to do well while just using Advair on a consistent basis.  I did emphasize that he should only use Advair at the dosage of 1 inhalation 2 times per day to avert the development of significant side effects by using more medications than indicated.  Assuming  he does well I will see him back in this clinic in 6 months or earlier if there is a problem.  Laurette Schimke, MD Allergy / Immunology [facility]

## 2018-08-03 ENCOUNTER — Encounter: Payer: Self-pay | Admitting: Allergy and Immunology

## 2018-08-04 ENCOUNTER — Other Ambulatory Visit: Payer: Self-pay | Admitting: Allergy & Immunology

## 2018-09-12 ENCOUNTER — Other Ambulatory Visit: Payer: Self-pay | Admitting: Family Medicine

## 2018-09-22 ENCOUNTER — Encounter: Payer: Self-pay | Admitting: Family Medicine

## 2019-01-03 ENCOUNTER — Ambulatory Visit: Payer: 59

## 2019-01-16 ENCOUNTER — Ambulatory Visit (INDEPENDENT_AMBULATORY_CARE_PROVIDER_SITE_OTHER): Payer: 59 | Admitting: Family Medicine

## 2019-01-16 ENCOUNTER — Other Ambulatory Visit: Payer: Self-pay

## 2019-01-16 ENCOUNTER — Encounter: Payer: Self-pay | Admitting: Family Medicine

## 2019-01-16 VITALS — Temp 99.4°F

## 2019-01-16 DIAGNOSIS — Z7189 Other specified counseling: Secondary | ICD-10-CM

## 2019-01-16 DIAGNOSIS — R509 Fever, unspecified: Secondary | ICD-10-CM | POA: Diagnosis not present

## 2019-01-16 DIAGNOSIS — R05 Cough: Secondary | ICD-10-CM | POA: Diagnosis not present

## 2019-01-16 DIAGNOSIS — R059 Cough, unspecified: Secondary | ICD-10-CM

## 2019-01-16 MED ORDER — BENZONATATE 100 MG PO CAPS: 200.0000 mg | ORAL_CAPSULE | Freq: Two times a day (BID) | ORAL | 0 refills | Status: DC | PRN

## 2019-01-16 MED ORDER — ONDANSETRON HCL 4 MG PO TABS: 4.0000 mg | ORAL_TABLET | Freq: Three times a day (TID) | ORAL | 0 refills | Status: DC | PRN

## 2019-01-16 NOTE — Patient Instructions (Signed)

## 2019-01-16 NOTE — Progress Notes (Signed)
VIRTUAL VISIT VIA VIDEO  I connected with Nathaniel [name] on 01/16/19 at  4:20 PM EDT by a video enabled telemedicine application and verified that I am speaking with the correct person using two identifiers. Location patient: Home Location provider: [facility], Office Persons participating in the virtual visit: Patient, Dr. Raoul Pitch and R.Baker, LPN  I discussed the limitations of evaluation and management by telemedicine and the availability of in person appointments. The patient expressed understanding and agreed to proceed.   SUBJECTIVE Chief Complaint  Patient presents with  . Fatigue    feels feverish and has chills. SOB. coughing, dry mostly. vomiting. hasn't eaten much and has some sinus pressure. diarrhea. mother currently has COVID. she tested positive this morning. patient has been sick for 1 week    HPI: Nathaniel [name] is a 18 y.o. male present for acute illness.  Patient reports he has had fevers, chills, mild short of breath, dry cough since last Friday.  Today he also had one episode of vomiting.  He had a fever of 99.4 F today.  He has extremely decreased appetite secondary to nausea.  He denies nasal congestion.  He admits to loose stools over this time period as well.  His mother tested positive for COVID-19, received her results today.  ROS: See pertinent positives and negatives per HPI.  Patient Active Problem List   Diagnosis Date Noted  . Sore throat 07/13/2017  . Hypogonadism male 04/02/2015  . Acquired acanthosis nigricans 11/27/2014  . Pre-diabetes 07/29/2013  . Morbid obesity ([place]) 07/29/2013  . Goiter 07/29/2013  . Dyspepsia 07/29/2013  . Essential hypertension, benign 07/29/2013  . Gynecomastia, male 07/29/2013  . PHARYNGITIS 04/16/2008  . Acute upper respiratory infection 04/16/2008  . INFLUENZA WITH OTHER RESPIRATORY MANIFESTATIONS 02/06/2008  . HERPETIC WHITLOW 12/12/2007  . COLD SORE 12/12/2007  . MIXED RECEPTIVE-EXPRESSIVE LANGUAGE DISORDER  12/12/2007  . CONSTIPATION 12/12/2007  . URTICARIA 12/12/2007  . UNSPECIFIED FETAL AND NEONATAL JAUNDICE 12/12/2007  . CARDIAC FLOW MURMUR 12/12/2007  . ALLERGIC RHINITIS 12/09/2007  . ASTHMA, MILD 12/09/2007    Social History   Tobacco Use  . Smoking status: Never Smoker  . Smokeless tobacco: Never Used  Substance Use Topics  . Alcohol use: No    Current Outpatient Medications:  .  cetirizine (ZYRTEC) 10 MG tablet, Take 10 mg by mouth daily. , Disp: , Rfl:  .  Cholecalciferol (VITAMIN D PO), Take 2,000 Units by mouth daily., Disp: , Rfl:  .  Fluticasone-Salmeterol (ADVAIR DISKUS) 250-50 MCG/DOSE AEPB, INHALE 1 PUFF BY MOUTH TWICE A DAY, Disp: 60 each, Rfl: 5 .  VENTOLIN HFA 108 (90 Base) MCG/ACT inhaler, INHALE 2 PUFFS INTO THE LUNGS EVERY 6 (SIX) HOURS AS NEEDED. FOR WHEEZING OR SHORTNESS OF BREATH, Disp: 18 Inhaler, Rfl: 0 .  azithromycin (ZITHROMAX) 250 MG tablet, 500 mg day 1, then 250 mg QD (Patient not taking: Reported on 08/02/2018), Disp: 6 tablet, Rfl: 0 .  predniSONE (DELTASONE) 20 MG tablet, 2 tabs po qd x 5d (Patient not taking: Reported on 08/02/2018), Disp: 10 tablet, Rfl: 0  Allergies  Allergen Reactions  . Amoxicillin Shortness Of Breath and Swelling    OBJECTIVE: Temp 99.4 F (37.4 C) (Oral)  Gen: No acute distress. Nontoxic in appearance.  Appears pale HENT: AT. [place].  MMM.  Eyes:Pupils Equal Round Reactive to light, Extraocular movements intact,  Conjunctiva without redness, discharge or icterus. CV: No edema Chest: Cough present, no shortness of breath present Skin: No rashes, purpura or  petechiae.  Neuro:  Alert. Oriented x3  Psych: Normal affect, dress and demeanor. Normal speech. Normal thought content and judgment.  ASSESSMENT AND PLAN: Nathaniel [name] is a 18 y.o. male present for  Cough/fever/COVID-19 education Rest, hydrate.  mucinex (DM if cough), nettie pot or nasal saline.  Zofran for nausea and Tessalon Perles for cough prescribed -COVID-19  education was provided along with self-isolation guidelines. - Novel Coronavirus, NAA ([facility]) -Follow-up dependent upon COVID results.  Patient was encouraged to self isolate.  If positive patient will need to remain in isolation for 10 days from onset of symptoms and he must be symptom-free for at least 72 hours without fever or cough before ending isolation. - He reports he does not need an excuse today because he is taking online classes only.  He understands instructions. -If symptoms are worsening with shortness of breath he is to report to emergency room.  Patient reports understanding.  > 15 minutes spent with patient, > 50% of that time face to face   Felix PaciniRenee Humbert Morozov, DO 01/16/2019

## 2019-01-17 ENCOUNTER — Encounter: Payer: 59 | Admitting: Family Medicine

## 2019-01-17 ENCOUNTER — Other Ambulatory Visit: Payer: Self-pay

## 2019-01-17 DIAGNOSIS — Z20822 Contact with and (suspected) exposure to covid-19: Secondary | ICD-10-CM

## 2019-01-19 ENCOUNTER — Telehealth: Payer: Self-pay | Admitting: Family Medicine

## 2019-01-19 LAB — NOVEL CORONAVIRUS, NAA: SARS-CoV-2, NAA: DETECTED — AB

## 2019-01-19 NOTE — Telephone Encounter (Signed)
Pt was called and given all information. He was advised if he experiences any SOB he needs to seek emergency help at hospital. Pt verbalized understanding

## 2019-01-19 NOTE — Telephone Encounter (Signed)
Please inform patient the following information: Patient's coronavirus test came back positive.   -He is to remain isolated for 10 days total from the time of his appointment which would be 9/24.  -  If all of his symptoms are resolved for at least 72 hours on 9/24 he can discontinue his isolation.  If he is having any symptoms remaining Monday or Tuesday of next week he should make an appointment to follow-up with his PCP for further instructions.

## 2019-01-23 ENCOUNTER — Telehealth: Payer: Self-pay | Admitting: Family Medicine

## 2019-01-23 ENCOUNTER — Telehealth: Payer: Self-pay | Admitting: *Deleted

## 2019-01-23 NOTE — Telephone Encounter (Signed)
Pt was advised flu shot can be given during appt next month. Pt needs work note stating clear to resume normal activity. His employer is requiring this.  Okay for note?

## 2019-01-23 NOTE — Telephone Encounter (Signed)
Patient feels better after testing positive for COVID. Patient has been more than 10 days symptom free other than his sense of taste is not as strong. Patient would like to know if he is okay to resume his normal life. Patient would also like to know when it is okay to come in for his flu shot.

## 2019-01-23 NOTE — Telephone Encounter (Signed)
Pt given result; his symptoms have resolved, and he states that his only symptom is lost of smell; instructions given: [name] Instruct the patient to remain in self-quarantine until they meet the "Non-Test Criteria for Ending Self-Isolation". Non-Test Criteria for Ending Self-Isolation All persons with fever and respiratory symptoms should isolate themselves until ALL conditions listed below are met: - at least 10 days since symptoms onset - AND 3 consecutive days fever free without antipyretics (acetaminophen [Tylenol] or ibuprofen [Advil]) - AND improvement in respiratory symptoms . If the patient develops respiratory issues/distress, seek medical care in the Emergency Department, call 911, reports symptoms and report COVID-19 positive test. Patient Instructions . ontinue to utilize over-the-counter medications for fever (ibuprofen and/or Tylenol) and cough (cough medicine and/or sore throat lozenges). .  wear a mask around people and follow good infection prevention techniques. [name]  only leave home to seek medical care. . Instruct patient to send family for food, prescriptions or medicines; or use delivery service.  .  they must wear a mask in public. . Instruct patient to limit contact with immediate family members or caregivers in the home, and use mask, social distancing, and handwashing to decrease risk to patients. o Please continue good preventive care measures, including frequent hand washing, avoid touching your face, cover coughs/sneezes with tissue or into elbow, stay out of crowds and keep a 6-foot distance from others.   . Instruct patient and family to clean hard surfaces touched by patient frequently with household cleaning products.; .  . pt also instructed to notified his PCP; he also was informed the [facility] will be notified, and may contact him with additional recommendations' the pt verbalized understanding.

## 2019-01-23 NOTE — Telephone Encounter (Signed)
Yes, note for return to work is fine.-thx

## 2019-01-24 ENCOUNTER — Telehealth: Payer: Self-pay | Admitting: Allergy and Immunology

## 2019-01-24 NOTE — Telephone Encounter (Signed)
Work note was done and sent via email to patient.

## 2019-01-24 NOTE — Telephone Encounter (Signed)
Patient just tested positive to COVID on the 15th. Nathaniel [name] states we need to push his appoint out at [facility] another week and since he is under the care of his PCP then he should check with them about getting his Flu shot. I attempted to call patient to reschedule his appt but got no answer and was unable to leave a voicemail due to it being full.   I will cancel the appointment for now, can someone call to reschedule? Thanks!

## 2019-01-24 NOTE — Telephone Encounter (Signed)
Pt has a appointment on Tuesday and would like to get his flu shots. [phone].

## 2019-01-25 NOTE — Telephone Encounter (Signed)
I called the patient, per Lonn Georgia, to move appointment out 2 weeks due to the diagnosis of COVID. Patient said to cancel the appointment and he would call back when he got his new work schedule.

## 2019-01-25 NOTE — Telephone Encounter (Signed)
Please note where you spoke with patient and thank you for all that you do.

## 2019-01-27 ENCOUNTER — Other Ambulatory Visit: Payer: Self-pay

## 2019-01-27 ENCOUNTER — Ambulatory Visit (INDEPENDENT_AMBULATORY_CARE_PROVIDER_SITE_OTHER): Payer: 59

## 2019-01-27 DIAGNOSIS — Z23 Encounter for immunization: Secondary | ICD-10-CM

## 2019-01-31 ENCOUNTER — Ambulatory Visit: Payer: 59 | Admitting: Allergy and Immunology

## 2019-02-21 ENCOUNTER — Other Ambulatory Visit: Payer: Self-pay | Admitting: Allergy and Immunology

## 2019-02-28 ENCOUNTER — Ambulatory Visit (INDEPENDENT_AMBULATORY_CARE_PROVIDER_SITE_OTHER): Payer: 59 | Admitting: Family Medicine

## 2019-02-28 ENCOUNTER — Encounter: Payer: Self-pay | Admitting: Family Medicine

## 2019-02-28 ENCOUNTER — Other Ambulatory Visit: Payer: Self-pay

## 2019-02-28 VITALS — BP 136/79 | HR 95 | Temp 97.8°F | Resp 16 | Ht 72.0 in | Wt 305.8 lb

## 2019-02-28 DIAGNOSIS — E669 Obesity, unspecified: Secondary | ICD-10-CM | POA: Diagnosis not present

## 2019-02-28 DIAGNOSIS — R030 Elevated blood-pressure reading, without diagnosis of hypertension: Secondary | ICD-10-CM | POA: Diagnosis not present

## 2019-02-28 DIAGNOSIS — Z0000 Encounter for general adult medical examination without abnormal findings: Secondary | ICD-10-CM

## 2019-02-28 DIAGNOSIS — M549 Dorsalgia, unspecified: Secondary | ICD-10-CM

## 2019-02-28 DIAGNOSIS — G8929 Other chronic pain: Secondary | ICD-10-CM

## 2019-02-28 DIAGNOSIS — G4733 Obstructive sleep apnea (adult) (pediatric): Secondary | ICD-10-CM | POA: Diagnosis not present

## 2019-02-28 DIAGNOSIS — M419 Scoliosis, unspecified: Secondary | ICD-10-CM

## 2019-02-28 DIAGNOSIS — R7301 Impaired fasting glucose: Secondary | ICD-10-CM | POA: Diagnosis not present

## 2019-02-28 LAB — CBC WITH DIFFERENTIAL/PLATELET
Basophils Absolute: 0.2 10*3/uL — ABNORMAL HIGH (ref 0.0–0.1)
Basophils Relative: 1.6 % (ref 0.0–3.0)
Eosinophils Absolute: 0.4 10*3/uL (ref 0.0–0.7)
Eosinophils Relative: 3.8 % (ref 0.0–5.0)
HCT: 43.7 % (ref 39.0–52.0)
Hemoglobin: 14.8 g/dL (ref 13.0–17.0)
Lymphocytes Relative: 44.8 % (ref 12.0–46.0)
Lymphs Abs: 4.2 10*3/uL — ABNORMAL HIGH (ref 0.7–4.0)
MCHC: 33.9 g/dL (ref 30.0–36.0)
MCV: 89 fl (ref 78.0–100.0)
Monocytes Absolute: 0.6 10*3/uL (ref 0.1–1.0)
Monocytes Relative: 6.2 % (ref 3.0–12.0)
Neutro Abs: 4.1 10*3/uL (ref 1.4–7.7)
Neutrophils Relative %: 43.6 % (ref 43.0–77.0)
Platelets: 304 10*3/uL (ref 150.0–400.0)
RBC: 4.91 Mil/uL (ref 4.22–5.81)
RDW: 14 % (ref 11.5–14.6)
WBC: 9.3 10*3/uL (ref 4.5–10.5)

## 2019-02-28 LAB — COMPREHENSIVE METABOLIC PANEL
ALT: 47 U/L (ref 0–53)
AST: 27 U/L (ref 0–37)
Albumin: 4.8 g/dL (ref 3.5–5.2)
Alkaline Phosphatase: 74 U/L (ref 39–117)
BUN: 11 mg/dL (ref 6–23)
CO2: 29 mEq/L (ref 19–32)
Calcium: 10.3 mg/dL (ref 8.4–10.5)
Chloride: 106 mEq/L (ref 96–112)
Creatinine, Ser: 0.66 mg/dL (ref 0.40–1.50)
GFR: 153.14 mL/min (ref >60.00)
Glucose, Bld: 101 mg/dL — ABNORMAL HIGH (ref 70–99)
Potassium: 5.1 mEq/L (ref 3.5–5.1)
Sodium: 141 mEq/L (ref 135–145)
Total Bilirubin: 0.5 mg/dL (ref 0.2–1.2)
Total Protein: 7.2 g/dL (ref 6.0–8.3)

## 2019-02-28 LAB — LIPID PANEL
Cholesterol: 154 mg/dL (ref 0–200)
HDL: 39.9 mg/dL (ref >39.00)
LDL Cholesterol: 92 mg/dL (ref 0–99)
NonHDL: 114.46
Total CHOL/HDL Ratio: 4
Triglycerides: 110 mg/dL (ref 0.0–149.0)
VLDL: 22 mg/dL (ref 0.0–40.0)

## 2019-02-28 LAB — HEMOGLOBIN A1C: Hgb A1c MFr Bld: 5.7 % (ref 4.6–6.5)

## 2019-02-28 NOTE — Progress Notes (Signed)
Office Note 02/28/2019  CC:  Chief Complaint  Patient presents with  . Annual Exam    pt is fasting    HPI:  Nathaniel [name] is a 18 y.o. male who is here for annual health maintenance exam.  Sleeping better since tonsillectomy.  Feels better after this regarding daytime energy level. Last couple months sleeping shorter amount of time, some waking up in mid sleep. He did not pursue the sleep study recommended by Dr. Frances Furbish.  He does no bp monitoring at home or elsewhere. He has gained a lot of wt since I last saw him, BMI over 40 now. He has ongoing mild chronic mid back pain, x-ray has shown mild scoliosis.  He is s/p a course of PT and still does home stretching but finds that the only relief from the pain is getting it "popped" by his mom. He asks if chiropractor would be ok.  He had covid 19 illness about 6 wks ago, +covid test 01/17/19, had uncomplicated illness and no asthma exacerbation/no steroids.  He has been back to feeling well for at least 1 mo.  Past Medical History:  Diagnosis Date  . Allergic rhinitis   . Chronic tonsillar hypertrophy    ENT did tonsillectomy 2020  . Elevated blood pressure reading without diagnosis of hypertension   . IFG (impaired fasting glucose) 12/2017   Gluc 107, HbA1c 5.5%  . Obesity, Class II, BMI 35-39.9   . OSA (obstructive sleep apnea) 02/2018   Sleep study recommended by Dr. Ivory Broad considering it.  . Severe persistent asthma    Dr. Sharyn Lull    Past Surgical History:  Procedure Laterality Date  . TONSILLECTOMY  06/03/2018    Family History  Problem Relation Age of Onset  . Heart attack Other   . Prostate cancer Other   . Hypertension Father   . Hypertension Maternal Grandfather   . Cancer Maternal Grandfather   . Asthma Neg Hx     Social History   Socioeconomic History  . Marital status: Single    Spouse name: Not on file  . Number of children: Not on file  . Years of education: Not on file  . Highest education  level: Not on file  Occupational History  . Not on file  Social Needs  . Financial resource strain: Not on file  . Food insecurity    Worry: Not on file    Inability: Not on file  . Transportation needs    Medical: Not on file    Non-medical: Not on file  Tobacco Use  . Smoking status: Never Smoker  . Smokeless tobacco: Never Used  Substance and Sexual Activity  . Alcohol use: No  . Drug use: No  . Sexual activity: Not on file  Lifestyle  . Physical activity    Days per week: Not on file    Minutes per session: Not on file  . Stress: Not on file  Relationships  . Social Musician on phone: Not on file    Gets together: Not on file    Attends religious service: Not on file    Active member of club or organization: Not on file    Attends meetings of clubs or organizations: Not on file    Relationship status: Not on file  . Intimate partner violence    Fear of current or ex partner: Not on file    Emotionally abused: Not on file    Physically abused: Not on file  Forced sexual activity: Not on file  Other Topics Concern  . Not on file  Social History Narrative   Single, lives with mom in [place].   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   No T/A/Ds.    Outpatient Medications Prior to Visit  Medication Sig Dispense Refill  . cetirizine (ZYRTEC) 10 MG tablet Take 10 mg by mouth daily.     . Cholecalciferol (VITAMIN D PO) Take 2,000 Units by mouth daily.    . Fluticasone-Salmeterol (ADVAIR DISKUS) 250-50 MCG/DOSE AEPB TAKE 1 PUFF BY MOUTH TWICE A DAY 60 each 0  . VENTOLIN HFA 108 (90 Base) MCG/ACT inhaler INHALE 2 PUFFS INTO THE LUNGS EVERY 6 (SIX) HOURS AS NEEDED. FOR WHEEZING OR SHORTNESS OF BREATH 18 Inhaler 0  . benzonatate (TESSALON) 100 MG capsule Take 2 capsules (200 mg total) by mouth 2 (two) times daily as needed for cough. (Patient not taking: Reported on 02/28/2019) 20 capsule 0  . ondansetron (ZOFRAN) 4 MG tablet  Take 1 tablet (4 mg total) by mouth every 8 (eight) hours as needed for nausea or vomiting. (Patient not taking: Reported on 02/28/2019) 20 tablet 0   No facility-administered medications prior to visit.     Allergies  Allergen Reactions  . Amoxicillin Shortness Of Breath and Swelling    ROS Review of Systems  Constitutional: Negative for appetite change, chills, fatigue and fever.  HENT: Negative for congestion, dental problem, ear pain and sore throat.   Eyes: Negative for discharge, redness and visual disturbance.  Respiratory: Negative for cough, chest tightness, shortness of breath and wheezing.   Cardiovascular: Negative for chest pain, palpitations and leg swelling.  Gastrointestinal: Negative for abdominal pain, blood in stool, diarrhea, nausea and vomiting.  Genitourinary: Negative for difficulty urinating, dysuria, flank pain, frequency, hematuria and urgency.  Musculoskeletal: Positive for back pain (see hpi). Negative for arthralgias, joint swelling, myalgias and neck stiffness.  Skin: Negative for pallor and rash.  Neurological: Negative for dizziness, speech difficulty, weakness and headaches.  Hematological: Negative for adenopathy. Does not bruise/bleed easily.  Psychiatric/Behavioral: Negative for confusion and sleep disturbance. The patient is not nervous/anxious.     PE; Blood pressure 136/79, pulse 95, temperature 97.8 F (36.6 C), temperature source Temporal, resp. rate 16, height 6' (1.829 m), weight (!) 305 lb 12.8 oz (138.7 kg), SpO2 98 %. Body mass index is 41.47 kg/m. Repeat bp today was 130/80 (initial was 136/79)  Gen: Alert, well appearing, obese-appearing.  Patient is oriented to person, place, time, and situation. AFFECT: pleasant, lucid thought and speech. ENT: Ears: EACs clear, normal epithelium.  TMs with good light reflex and landmarks bilaterally.  Eyes: no injection, icteris, swelling, or exudate.  EOMI, PERRLA. Nose: no drainage or turbinate  edema/swelling.  No injection or focal lesion.  Mouth: lips without lesion/swelling.  Oral mucosa pink and moist.  Dentition intact and without obvious caries or gingival swelling.  Oropharynx without erythema, exudate, or swelling.  Neck: supple/nontender.  No LAD, mass, or TM.  Carotid pulses 2+ bilaterally, without bruits. CV: RRR, no m/r/g.   LUNGS: CTA bilat, nonlabored resps, good aeration in all lung fields. ABD: soft, NT, ND, BS normal.  No hepatospenomegaly or mass.  No bruits. EXT: no clubbing, cyanosis, or edema.  Musculoskeletal: no joint swelling, erythema, warmth, or tenderness.  ROM of all joints intact. Skin - no sores or suspicious lesions or rashes or color changes   Pertinent labs:  Lab Results  Component Value Date   TSH 3.16 12/07/2017   Lab Results  Component Value Date   WBC 11.6 12/07/2017   HGB 14.5 12/07/2017   HCT 42.5 12/07/2017   MCV 88.4 12/07/2017   PLT 276.0 12/07/2017   Lab Results  Component Value Date   CREATININE 0.80 12/07/2017   BUN 14 12/07/2017   NA 142 12/07/2017   K 4.4 12/07/2017   CL 109 12/07/2017   CO2 26 12/07/2017   Lab Results  Component Value Date   ALT 39 12/07/2017   AST 21 12/07/2017   ALKPHOS 76 12/07/2017   BILITOT 0.4 12/07/2017   Lab Results  Component Value Date   CHOL 153 12/07/2017   Lab Results  Component Value Date   HDL 36.30 (L) 12/07/2017   Lab Results  Component Value Date   LDLCALC 94 12/07/2017   Lab Results  Component Value Date   TRIG 114.0 12/07/2017   Lab Results  Component Value Date   CHOLHDL 4 12/07/2017   Lab Results  Component Value Date   HGBA1C 5.5 12/08/2017   ASSESSMENT AND PLAN:   1) Morbid obesity: discussed need for TLC to lose wt: needs to get more aggressive with diet and START cardio exercise.  He is too busy as full time student and working full time so I'll hold off on non-surg bariatric clinic referral at this time.  2) Chronic mid back pain, hx of mild  scoliosis: PT and home stretching not very helpful. Manipulation of spine at home has seemed to help. REfer to chiropractor today. He had plain radiographs of thoracic and lumbar spine 09/07/2017.  3) OSA: question of.  Sleep study recommended by Dr. Frances FurbishAthar. He did get tonsillectomy since that time and says he feels better, but given his RFs of elev bp w/out dx of HTN and worsening obesity I recommended he contact Dr. Teofilo PodAthar's office to arrange the sleep study.  4) Covid 19 illness: 6 weeks ago.  No complications. Back to feeling well for 1 mo now.  5) Health maintenance exam: Reviewed age and gender appropriate health maintenance issues (prudent diet, regular exercise, health risks of tobacco and excessive alcohol, use of seatbelts, fire alarms in home, use of sunscreen).  Also reviewed age and gender appropriate health screening as well as vaccine recommendations. Vaccines: all UTD, including seasonal flu. Labs: fasting HP + A1c (IFG).  An After Visit Summary was printed and given to the patient.  FOLLOW UP:  Return in about 6 months (around 08/29/2019) for f/u obesity, bp, prediabetes.  Signed:  Santiago BumpersPhil Dabney Dever, MD           02/28/2019

## 2019-03-02 LAB — TSH: TSH: 2.74 u[IU]/mL (ref 0.35–5.50)

## 2019-05-22 ENCOUNTER — Encounter: Payer: Self-pay | Admitting: Family Medicine

## 2019-05-22 ENCOUNTER — Ambulatory Visit (INDEPENDENT_AMBULATORY_CARE_PROVIDER_SITE_OTHER): Payer: 59 | Admitting: Family Medicine

## 2019-05-22 ENCOUNTER — Other Ambulatory Visit: Payer: Self-pay

## 2019-05-22 DIAGNOSIS — B349 Viral infection, unspecified: Secondary | ICD-10-CM

## 2019-05-22 DIAGNOSIS — J4551 Severe persistent asthma with (acute) exacerbation: Secondary | ICD-10-CM | POA: Diagnosis not present

## 2019-05-22 DIAGNOSIS — J019 Acute sinusitis, unspecified: Secondary | ICD-10-CM

## 2019-05-22 MED ORDER — AZITHROMYCIN 250 MG PO TABS: ORAL_TABLET | ORAL | 0 refills | Status: DC

## 2019-05-22 MED ORDER — PREDNISONE 20 MG PO TABS: ORAL_TABLET | ORAL | 0 refills | Status: DC

## 2019-05-22 NOTE — Progress Notes (Signed)
Virtual Visit via Video Note  I connected with pt on 05/22/19 at 11:00 AM EST by a video enabled telemedicine application and verified that I am speaking with the correct person using two identifiers.  Location patient: home Location provider:work or home office Persons participating in the virtual visit: patient, provider  I discussed the limitations of evaluation and management by telemedicine and the availability of in person appointments. The patient expressed understanding and agreed to proceed.  Telemedicine visit is a necessity given the COVID-19 restrictions in place at the current time.  HPI: 19 y/o male being seen today for multiple symptoms->"sinus problems and generalized body aches". Onset about 6-7 days ago, ST initially.  Fatigued and more winded, some SOB would come and go. Albut helped sometimes but not others.  Felt hot and winded with moving around at work. No fevers.  HA in frontal sinus regions bilt, says some swelling in this region.  +cough, +nasal congestion, some mucous when he blows his nose-"greenish".  Taste and smell intact. 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 He has continued to go to work during this. Has not gotten covid test[facility]". Reports hx of covid illness 01/2019, + test 01/17/19. Has tried otc fluticasone qd lately and this has helped some.  ROS: See pertinent positives and negatives per HPI.  Past Medical History:  Diagnosis Date  . Allergic rhinitis   . Chronic tonsillar hypertrophy    ENT did tonsillectomy 2020  . Elevated blood pressure reading without diagnosis of hypertension   . IFG (impaired fasting glucose) 12/2017   Gluc 107, HbA1c 5.5%  . Obesity, Class II, BMI 35-39.9   . OSA (obstructive sleep apnea) 02/2018   Sleep study recommended by Dr. Ivory Broad considering it.  . Severe persistent asthma    Dr. Sharyn Lull    Past Surgical History:  Procedure Laterality  Date  . TONSILLECTOMY  06/03/2018    Family History  Problem Relation Age of Onset  . Heart attack Other   . Prostate cancer Other   . Hypertension Father   . Hypertension Maternal Grandfather   . Cancer Maternal Grandfather   . Asthma Neg Hx     SOCIAL HX:  Social History   Socioeconomic History  . Marital status: Single    Spouse name: Not on file  . Number of children: Not on file  . Years of education: Not on file  . Highest education level: Not on file  Occupational History  . Not on file  Tobacco Use  . Smoking status: Never Smoker  . Smokeless tobacco: Never Used  Substance and Sexual Activity  . Alcohol use: No  . Drug use: No  . Sexual activity: Not on file  Other Topics Concern  . Not on file  Social History Narrative   Single, lives with mom in [place].   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   No T/A/Ds.   Social Determinants of Health   Financial Resource Strain:   . Difficulty of Paying Living Expenses: Not on file  Food Insecurity:   . Worried About Programme researcher, broadcasting/film/video in the Last Year: Not on file  . Ran Out of Food in the Last Year: Not on file  Transportation Needs:   . Lack of Transportation (Medical): Not on file  . Lack of Transportation (Non-Medical): Not on file  Physical Activity:   .  Days of Exercise per Week: Not on file  . Minutes of Exercise per Session: Not on file  Stress:   . Feeling of Stress : Not on file  Social Connections:   . Frequency of Communication with Friends and Family: Not on file  . Frequency of Social Gatherings with Friends and Family: Not on file  . Attends Religious Services: Not on file  . Active Member of Clubs or Organizations: Not on file  . Attends Archivist Meetings: Not on file  . Marital Status: Not on file    Current Outpatient Medications:  .  cetirizine (ZYRTEC) 10 MG tablet, Take 10 mg by mouth daily. , Disp: , Rfl:  .  Cholecalciferol (VITAMIN D  PO), Take 2,000 Units by mouth daily., Disp: , Rfl:  .  Fluticasone-Salmeterol (ADVAIR DISKUS) 250-50 MCG/DOSE AEPB, TAKE 1 PUFF BY MOUTH TWICE A DAY, Disp: 60 each, Rfl: 0 .  VENTOLIN HFA 108 (90 Base) MCG/ACT inhaler, INHALE 2 PUFFS INTO THE LUNGS EVERY 6 (SIX) HOURS AS NEEDED. FOR WHEEZING OR SHORTNESS OF BREATH, Disp: 18 Inhaler, Rfl: 0  EXAM:  VITALS per patient if applicable: There were no vitals taken for this visit.   GENERAL: alert, oriented, appears well and in no acute distress  HEENT: atraumatic, conjunttiva clear, no obvious abnormalities on inspection of external nose and ears  NECK: normal movements of the head and neck  LUNGS: on inspection no signs of respiratory distress, breathing rate appears normal, no obvious gross SOB, gasping or wheezing  CV: no obvious cyanosis  MS: moves all visible extremities without noticeable abnormality  PSYCH/NEURO: pleasant and cooperative, no obvious depression or anxiety, speech and thought processing grossly intact  LABS: none today    Chemistry      Component Value Date/Time   NA 141 02/28/2019 0954   K 5.1 02/28/2019 0954   CL 106 02/28/2019 0954   CO2 29 02/28/2019 0954   BUN 11 02/28/2019 0954   CREATININE 0.66 02/28/2019 0954   CREATININE 0.71 08/27/2014 1624      Component Value Date/Time   CALCIUM 10.3 02/28/2019 0954   ALKPHOS 74 02/28/2019 0954   AST 27 02/28/2019 0954   ALT 47 02/28/2019 0954   BILITOT 0.5 02/28/2019 0954     Lab Results  Component Value Date   WBC 9.3 02/28/2019   HGB 14.8 02/28/2019   HCT 43.7 02/28/2019   MCV 89.0 02/28/2019   PLT 304.0 02/28/2019   Lab Results  Component Value Date   TSH 2.74 02/28/2019   Lab Results  Component Value Date   HGBA1C 5.7 02/28/2019    ASSESSMENT AND PLAN:  Discussed the following assessment and plan:  Viral resp syndrome; possible covid 19 illness vs influenza, other typical cold viruses possible as well. Not improving much at this  juncture and has some sinusitis sx's + high risk for complications due to having asthma-->will treat for acute asthma flare with prednisone 40mg  qd x 5d, then 20mg  qd x 5d.  Also Z pack for possible sinusitis and/or atypical pneumonia. Mucinex DM q12h prn. Note stating no work (14 from onset of sx's + signif improving), may return 05/30/19 unless he calls and states he is not improving.  -we discussed possible serious and likely etiologies, options for evaluation and workup, limitations of telemedicine visit vs in person visit, treatment, treatment risks and precautions. Pt prefers to treat via telemedicine empirically rather then risking or undertaking an in person visit at this moment. Patient  agrees to seek prompt in person care if worsening, new symptoms arise, or if is not improving with treatment.   I discussed the assessment and treatment plan with the patient. The patient was provided an opportunity to ask questions and all were answered. The patient agreed with the plan and demonstrated an understanding of the instructions.   The patient was advised to call back or seek an in-person evaluation if the symptoms worsen or if the condition fails to improve as anticipated.  F/u: if not improving appropriately  Signed:  Santiago Bumpers, MD           05/22/2019

## 2019-05-29 ENCOUNTER — Encounter: Payer: Self-pay | Admitting: Family Medicine

## 2019-05-30 NOTE — Telephone Encounter (Signed)
Patient was advised to f/u if no improvement appropriately. Please advise, thanks. Work note stated he could return today 1/26 if symptoms resolved.

## 2019-05-30 NOTE — Telephone Encounter (Signed)
Telemed visit pls

## 2019-05-30 NOTE — Telephone Encounter (Signed)
Please call patient [phone]. I have offered virtual appt tomorrow at 10AM.  Only opening with Dr. Milinda Cave. He has declined. 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 He stated that "Dr.McGowen is aware of how critical and vital this appt is because of my illness and not getting any better, and weather or not I should be working"  He asked if Dr. Claiborne Billings had any openings today. I told him she had no availability today or tomorrow.

## 2019-05-30 NOTE — Telephone Encounter (Signed)
Patient has declined virtual appt offer for tomorrow morning.  Please advise, thanks.

## 2019-05-31 ENCOUNTER — Other Ambulatory Visit: Payer: Self-pay

## 2019-05-31 ENCOUNTER — Ambulatory Visit (INDEPENDENT_AMBULATORY_CARE_PROVIDER_SITE_OTHER): Payer: 59 | Admitting: Family Medicine

## 2019-05-31 DIAGNOSIS — R3589 Other polyuria: Secondary | ICD-10-CM

## 2019-05-31 DIAGNOSIS — R1084 Generalized abdominal pain: Secondary | ICD-10-CM | POA: Diagnosis not present

## 2019-05-31 DIAGNOSIS — R358 Other polyuria: Secondary | ICD-10-CM

## 2019-05-31 DIAGNOSIS — R197 Diarrhea, unspecified: Secondary | ICD-10-CM | POA: Diagnosis not present

## 2019-05-31 DIAGNOSIS — J069 Acute upper respiratory infection, unspecified: Secondary | ICD-10-CM

## 2019-05-31 DIAGNOSIS — R140 Abdominal distension (gaseous): Secondary | ICD-10-CM | POA: Diagnosis not present

## 2019-05-31 DIAGNOSIS — R7301 Impaired fasting glucose: Secondary | ICD-10-CM

## 2019-05-31 DIAGNOSIS — R632 Polyphagia: Secondary | ICD-10-CM

## 2019-05-31 DIAGNOSIS — J45901 Unspecified asthma with (acute) exacerbation: Secondary | ICD-10-CM

## 2019-05-31 NOTE — Progress Notes (Signed)
Virtual Visit via Video Note  I connected with pt on 05/31/19 at  4:00 PM EST by a video enabled telemedicine application and verified that I am speaking with the correct person using two identifiers.  Location patient: home Location provider:work or home office Persons participating in the virtual visit: patient, provider  I discussed the limitations of evaluation and management by telemedicine and the availability of in person appointments. The patient expressed understanding and agreed to proceed.  Telemedicine visit is a necessity given the COVID-19 restrictions in place at the current time.  HPI: 19 y/o male being seen today for ongoing respiratory complaints. A/P as of last visit 9 days ago: "Viral resp syndrome; possible covid 19 illness vs influenza, other typical cold viruses possible as well. Not improving much at this juncture and has some sinusitis sx's + high risk for complications due to having asthma-->will treat for acute asthma flare with prednisone 40mg  qd x 5d, then 20mg  qd x 5d.  Also Z pack for possible sinusitis and/or atypical pneumonia. Mucinex DM q12h prn. Note stating no work (14 from onset of sx's + signif improving), may return 05/30/19 unless he calls and states he is not improving."  Interim hx: Improved overall but linger sx's of fatigue, some intermittent chest tightness and wheezing, and some prominent GI sx's are causing him troubles. Having multiple watery stools per day, urgent and mostly postprandial and with some significant abd pain at times.  Occ nausea but no vomiting.  No nocturnal diarrhea episodes.  No blood or pus noted in stool. No fevers. Appetite is WAY WAY up, denies polydipsia but endorses polyuria. He d/c'd prednisone after 5d due to side effects, otherwise compliant with meds. Using flonase, having some dried blood in mucous from nose.   ROS: See pertinent positives and negatives per HPI.  Past Medical History:  Diagnosis Date  .  Allergic rhinitis   . Chronic tonsillar hypertrophy    ENT did tonsillectomy 2020  . Elevated blood pressure reading without diagnosis of hypertension   . IFG (impaired fasting glucose) 12/2017   Gluc 107, HbA1c 5.5%  . Obesity, Class II, BMI 35-39.9   . OSA (obstructive sleep apnea) 02/2018   Sleep study recommended by Dr. 01/2018 considering it.  . Severe persistent asthma    Dr. 03/2018    Past Surgical History:  Procedure Laterality Date  . TONSILLECTOMY  06/03/2018    Family History  Problem Relation Age of Onset  . Heart attack Other   . Prostate cancer Other   . Hypertension Father   . Hypertension Maternal Grandfather   . Cancer Maternal Grandfather   . Asthma Neg Hx     SOCIAL HX:  Social History   Socioeconomic History  . Marital status: Single    Spouse name: Not on file  . Number of children: Not on file  . Years of education: Not on file  . Highest education level: Not on file  Occupational History  . Not on file  Tobacco Use  . Smoking status: Never Smoker  . Smokeless tobacco: Never Used  Substance and Sexual Activity  . Alcohol use: No  . Drug use: No  . Sexual activity: Not on file  Other Topics Concern  . Not on file  Social History Narrative   Single, lives with mom in [place].   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   No T/A/Ds.   Social Determinants of Health   Financial  Resource Strain:   . Difficulty of Paying Living Expenses: Not on file  Food Insecurity:   . Worried About Programme researcher, broadcasting/film/video in the Last Year: Not on file  . Ran Out of Food in the Last Year: Not on file  Transportation Needs:   . Lack of Transportation (Medical): Not on file  . Lack of Transportation (Non-Medical): Not on file  Physical Activity:   . Days of Exercise per Week: Not on file  . Minutes of Exercise per Session: Not on file  Stress:   . Feeling of Stress : Not on file  Social Connections:   . Frequency of  Communication with Friends and Family: Not on file  . Frequency of Social Gatherings with Friends and Family: Not on file  . Attends Religious Services: Not on file  . Active Member of Clubs or Organizations: Not on file  . Attends Banker Meetings: Not on file  . Marital Status: Not on file     Current Outpatient Medications:  .  cetirizine (ZYRTEC) 10 MG tablet, Take 10 mg by mouth daily. , Disp: , Rfl:  .  Cholecalciferol (VITAMIN D PO), Take 2,000 Units by mouth daily., Disp: , Rfl:  .  Fluticasone-Salmeterol (ADVAIR DISKUS) 250-50 MCG/DOSE AEPB, TAKE 1 PUFF BY MOUTH TWICE A DAY, Disp: 60 each, Rfl: 0 .  VENTOLIN HFA 108 (90 Base) MCG/ACT inhaler, INHALE 2 PUFFS INTO THE LUNGS EVERY 6 (SIX) HOURS AS NEEDED. FOR WHEEZING OR SHORTNESS OF BREATH (Patient not taking: Reported on 05/31/2019), Disp: 18 Inhaler, Rfl: 0  EXAM:  VITALS per patient if applicable: There were no vitals taken for this visit.   GENERAL: alert, oriented, appears well and in no acute distress  HEENT: atraumatic, conjunttiva clear, no obvious abnormalities on inspection of external nose and ears  NECK: normal movements of the head and neck  LUNGS: on inspection no signs of respiratory distress, breathing rate appears normal, no obvious gross SOB, gasping or wheezing  CV: no obvious cyanosis  MS: moves all visible extremities without noticeable abnormality  PSYCH/NEURO: pleasant and cooperative, no obvious depression or anxiety, speech and thought processing grossly intact  LABS: none today    Chemistry      Component Value Date/Time   NA 141 02/28/2019 0954   K 5.1 02/28/2019 0954   CL 106 02/28/2019 0954   CO2 29 02/28/2019 0954   BUN 11 02/28/2019 0954   CREATININE 0.66 02/28/2019 0954   CREATININE 0.71 08/27/2014 1624      Component Value Date/Time   CALCIUM 10.3 02/28/2019 0954   ALKPHOS 74 02/28/2019 0954   AST 27 02/28/2019 0954   ALT 47 02/28/2019 0954   BILITOT 0.5  02/28/2019 0954     Lab Results  Component Value Date   WBC 9.3 02/28/2019   HGB 14.8 02/28/2019   HCT 43.7 02/28/2019   MCV 89.0 02/28/2019   PLT 304.0 02/28/2019   Lab Results  Component Value Date   TSH 2.74 02/28/2019   Lab Results  Component Value Date   HGBA1C 5.7 02/28/2019   ASSESSMENT AND PLAN:  Discussed the following assessment and plan:  1) URI with acute exacerbation of persistent asthma. Improving but some lagging fatigue, intermittent chest tightness. He did not take full taper of prednisone b/c of side effects (insomnia, jittery, big appetite. Will keep him out of work tomorrow and the next day, may return 06/05/19.  2) Acute diarrhea, gassy/bloated; sounds functional/IBS-type sx's. Will get stool  sample for testing. Suggested he start daily fiber supplement and try to eat bland diet for now. CBC and CMET.  3) Polyphagia and polyuria, hx of IFG; I'll check fasting glucose and HbA1c to make sure these aren't a cause but I think this is likely related to recent prednisone. He states he has not lost any weight during this illness.  -we discussed possible serious and likely etiologies, options for evaluation and workup, limitations of telemedicine visit vs in person visit, treatment, treatment risks and precautions. Pt prefers to treat via telemedicine empirically rather then risking or undertaking an in person visit at this moment. Patient agrees to seek prompt in person care if worsening, new symptoms arise, or if is not improving with treatment.   I discussed the assessment and treatment plan with the patient. The patient was provided an opportunity to ask questions and all were answered. The patient agreed with the plan and demonstrated an understanding of the instructions.   The patient was advised to call back or seek an in-person evaluation if the symptoms worsen or if the condition fails to improve as anticipated.  F/u: if not improving gradually over the  next 3-4 days.  Signed:  Crissie Sickles, MD           05/31/2019

## 2019-05-31 NOTE — Telephone Encounter (Signed)
I must see him before I do anything for him. 4 oclock/4:15 telemed this afternoon is fine. Sorry, this is just appropriate medical practice.

## 2019-06-06 ENCOUNTER — Other Ambulatory Visit: Payer: Self-pay

## 2019-06-06 ENCOUNTER — Ambulatory Visit (INDEPENDENT_AMBULATORY_CARE_PROVIDER_SITE_OTHER): Payer: 59 | Admitting: Family Medicine

## 2019-06-06 DIAGNOSIS — J45901 Unspecified asthma with (acute) exacerbation: Secondary | ICD-10-CM | POA: Diagnosis not present

## 2019-06-06 DIAGNOSIS — R7301 Impaired fasting glucose: Secondary | ICD-10-CM

## 2019-06-06 DIAGNOSIS — R632 Polyphagia: Secondary | ICD-10-CM | POA: Diagnosis not present

## 2019-06-06 DIAGNOSIS — J069 Acute upper respiratory infection, unspecified: Secondary | ICD-10-CM | POA: Diagnosis not present

## 2019-06-06 DIAGNOSIS — R358 Other polyuria: Secondary | ICD-10-CM | POA: Diagnosis not present

## 2019-06-06 DIAGNOSIS — R3589 Other polyuria: Secondary | ICD-10-CM

## 2019-06-06 DIAGNOSIS — R1084 Generalized abdominal pain: Secondary | ICD-10-CM

## 2019-06-06 DIAGNOSIS — R197 Diarrhea, unspecified: Secondary | ICD-10-CM

## 2019-06-06 LAB — CBC WITH DIFFERENTIAL/PLATELET
Basophils Absolute: 0.1 10*3/uL (ref 0.0–0.1)
Basophils Relative: 0.9 % (ref 0.0–3.0)
Eosinophils Absolute: 0.2 10*3/uL (ref 0.0–0.7)
Eosinophils Relative: 1.6 % (ref 0.0–5.0)
HCT: 43.8 % (ref 39.0–52.0)
Hemoglobin: 14.4 g/dL (ref 13.0–17.0)
Lymphocytes Relative: 40 % (ref 12.0–46.0)
Lymphs Abs: 4.4 10*3/uL — ABNORMAL HIGH (ref 0.7–4.0)
MCHC: 32.9 g/dL (ref 30.0–36.0)
MCV: 88.4 fl (ref 78.0–100.0)
Monocytes Absolute: 0.8 10*3/uL (ref 0.1–1.0)
Monocytes Relative: 7.1 % (ref 3.0–12.0)
Neutro Abs: 5.6 10*3/uL (ref 1.4–7.7)
Neutrophils Relative %: 50.4 % (ref 43.0–77.0)
Platelets: 315 10*3/uL (ref 150.0–400.0)
RBC: 4.96 Mil/uL (ref 4.22–5.81)
RDW: 13 % (ref 11.5–14.6)
WBC: 11.1 10*3/uL — ABNORMAL HIGH (ref 4.5–10.5)

## 2019-06-06 LAB — COMPREHENSIVE METABOLIC PANEL
ALT: 41 U/L (ref 0–53)
AST: 23 U/L (ref 0–37)
Albumin: 4.7 g/dL (ref 3.5–5.2)
Alkaline Phosphatase: 60 U/L (ref 39–117)
BUN: 13 mg/dL (ref 6–23)
CO2: 27 mEq/L (ref 19–32)
Calcium: 10.3 mg/dL (ref 8.4–10.5)
Chloride: 105 mEq/L (ref 96–112)
Creatinine, Ser: 0.79 mg/dL (ref 0.40–1.50)
GFR: 124.12 mL/min (ref >60.00)
Glucose, Bld: 95 mg/dL (ref 70–99)
Potassium: 5 mEq/L (ref 3.5–5.1)
Sodium: 141 mEq/L (ref 135–145)
Total Bilirubin: 0.5 mg/dL (ref 0.2–1.2)
Total Protein: 7.2 g/dL (ref 6.0–8.3)

## 2019-06-06 LAB — HEMOGLOBIN A1C: Hgb A1c MFr Bld: 5.8 % (ref 4.6–6.5)

## 2019-08-30 ENCOUNTER — Ambulatory Visit: Payer: 59 | Admitting: Family Medicine

## 2019-09-12 ENCOUNTER — Encounter: Payer: Self-pay | Admitting: Allergy and Immunology

## 2019-09-12 ENCOUNTER — Ambulatory Visit: Payer: 59 | Admitting: Allergy and Immunology

## 2019-09-12 ENCOUNTER — Other Ambulatory Visit: Payer: Self-pay

## 2019-09-12 VITALS — BP 130/82 | HR 95 | Temp 98.3°F | Resp 16 | Ht 71.0 in | Wt 314.0 lb

## 2019-09-12 DIAGNOSIS — J3089 Other allergic rhinitis: Secondary | ICD-10-CM

## 2019-09-12 DIAGNOSIS — J4550 Severe persistent asthma, uncomplicated: Secondary | ICD-10-CM | POA: Diagnosis not present

## 2019-09-12 MED ORDER — BREZTRI AEROSPHERE 160-9-4.8 MCG/ACT IN AERO: INHALATION_SPRAY | RESPIRATORY_TRACT | 5 refills | Status: DC

## 2019-09-12 MED ORDER — ALBUTEROL SULFATE HFA 108 (90 BASE) MCG/ACT IN AERS: INHALATION_SPRAY | RESPIRATORY_TRACT | 1 refills | Status: DC

## 2019-09-12 NOTE — Patient Instructions (Addendum)
  1. Start Breztri - 2 inhalations 2 times per day (Advair replacement)  2. Start OTC Nasacort - 1 spray each nostril 2 time per day  3. Continue ProAir HFA 2 puffs every 4-6 hours if needed  4. Continue Zyrtec 10 mg once a day if needed  5. Return to clinic in 4 weeks or earlier if problem

## 2019-09-12 NOTE — Progress Notes (Signed)
[name] - [place] [facility] [place]   Follow-up Note  Referring Provider: Jeoffrey Massed, MD Primary Provider: Jeoffrey Massed, MD Date of Office Visit: 09/12/2019  Subjective:   Nathaniel [name] (DOB: 12/08/2000) is a 19 y.o. male who [facility] in re-evaluation of the following:  HPI: Nathaniel [name] returns to this clinic in evaluation of asthma and allergic rhinitis.  I have not seen him in this clinic since 02 August 2018.  In September 2020 he contracted Covid with involvement of his upper and lower airway and constitutional symptoms and prolonged fever and anosmia.  It took approximately 3 months for all of his symptoms to resolve including his anosmia.  He has since received 2 Covid vaccinations without any difficulty.  Over the course of the past several months he has had problems with his asthma.  He has noticed a lot more shortness of breath and this appears to respond to a short acting bronchodilator.  He has been using his Advair twice a day on a regular basis.  Likewise, his nose has been stuffy especially since the spring season.  This occurs even though he uses nasal fluticasone a few times per week.  He can use nasal fluticasone every day because he develops epistaxis.  Allergies as of 09/12/2019      Reactions   Amoxicillin Shortness Of Breath, Swelling      Medication List      cetirizine 10 MG tablet Commonly known as: ZYRTEC Take 10 mg by mouth daily.   Fluticasone-Salmeterol 250-50 MCG/DOSE Aepb Commonly known as: Advair Diskus TAKE 1 PUFF BY MOUTH TWICE A DAY   Ventolin HFA 108 (90 Base) MCG/ACT inhaler Generic drug: albuterol INHALE 2 PUFFS INTO THE LUNGS EVERY 6 (SIX) HOURS AS NEEDED. FOR WHEEZING OR SHORTNESS OF BREATH   VITAMIN D PO Take 2,000 Units by mouth daily.       Past Medical History:  Diagnosis Date  . Allergic rhinitis   . Chronic tonsillar hypertrophy    ENT did  tonsillectomy 2020  . Elevated blood pressure reading without diagnosis of hypertension   . IFG (impaired fasting glucose) 12/2017   Gluc 107, HbA1c 5.5%  . Obesity, Class II, BMI 35-39.9   . OSA (obstructive sleep apnea) 02/2018   Sleep study recommended by Dr. Ivory Broad considering it.  . Severe persistent asthma    Dr. Sharyn Lull    Past Surgical History:  Procedure Laterality Date  . TONSILLECTOMY  06/03/2018    Review of systems negative except as noted in HPI / PMHx or noted below:  Review of Systems  Constitutional: Negative.   HENT: Negative.   Eyes: Negative.   Respiratory: Negative.   Cardiovascular: Negative.   Gastrointestinal: Negative.   Genitourinary: Negative.   Musculoskeletal: Negative.   Skin: Negative.   Neurological: Negative.   Endo/Heme/Allergies: Negative.   Psychiatric/Behavioral: Negative.      Objective:   Vitals:   09/12/19 1559  BP: 130/82  Pulse: 95  Resp: 16  Temp: 98.3 F (36.8 C)  SpO2: 97%   Height: 5\' 11"  (180.3 cm)  Weight: (!) 314 lb (142.4 kg)   Physical Exam Constitutional:      Appearance: He is not diaphoretic.  HENT:     Head: Normocephalic.     Right Ear: Tympanic membrane, ear canal and external ear normal.     Left Ear: Tympanic membrane, ear canal and external ear normal.  Nose: Nose normal. No mucosal edema or rhinorrhea.     Mouth/Throat:     Pharynx: Uvula midline. No oropharyngeal exudate.  Eyes:     Conjunctiva/sclera: Conjunctivae normal.  Neck:     Thyroid: No thyromegaly.     Trachea: Trachea normal. No tracheal tenderness or tracheal deviation.  Cardiovascular:     Rate and Rhythm: Normal rate and regular rhythm.     Heart sounds: Normal heart sounds, S1 normal and S2 normal. No murmur.  Pulmonary:     Effort: No respiratory distress.     Breath sounds: No stridor. Wheezing (Expiratory wheezing posterior lung fields bilaterally) present. No rales.  Lymphadenopathy:     Head:     Right side of  head: No tonsillar adenopathy.     Left side of head: No tonsillar adenopathy.     Cervical: No cervical adenopathy.  Skin:    Findings: No erythema or rash.     Nails: There is no clubbing.  Neurological:     Mental Status: He is alert.     Diagnostics:    Spirometry was performed and demonstrated an FEV1 of 3.78 at 78 % of predicted.  The patient had an Asthma Control Test with the following results: ACT Total Score: 24.    Assessment and Plan:   1. Not well controlled severe persistent asthma   2. Other allergic rhinitis     1. Start Breztri - 2 inhalations 2 times per day (Advair replacement)  2. Start OTC Nasacort - 1 spray each nostril 2 time per day  3. Continue ProAir HFA 2 puffs every 4-6 hours if needed  4. Continue Zyrtec 10 mg once a day if needed  5. Return to clinic in 4 weeks or earlier if problem  Nathaniel [name] will use a triple inhaler and consistent use of a nasal steroid over the course of the next 4 weeks and we will have him return to the clinic at that point in time and make a decision about further evaluation and treatment based upon his response.  Allena Katz, MD Allergy / Immunology [place]

## 2019-09-13 ENCOUNTER — Encounter: Payer: Self-pay | Admitting: Allergy and Immunology

## 2019-10-10 ENCOUNTER — Ambulatory Visit: Payer: 59 | Admitting: Allergy and Immunology

## 2019-10-10 ENCOUNTER — Encounter: Payer: Self-pay | Admitting: Allergy and Immunology

## 2019-10-10 ENCOUNTER — Other Ambulatory Visit: Payer: Self-pay

## 2019-10-10 VITALS — BP 118/72 | HR 102 | Temp 98.0°F | Resp 16

## 2019-10-10 DIAGNOSIS — J4550 Severe persistent asthma, uncomplicated: Secondary | ICD-10-CM

## 2019-10-10 DIAGNOSIS — J3089 Other allergic rhinitis: Secondary | ICD-10-CM

## 2019-10-10 NOTE — Progress Notes (Signed)
[place] - [place] - [place]   Follow-up Note  Referring Provider: Tammi Sou, MD Primary Provider: Tammi Sou, MD Date of Office Visit: 10/10/2019  Subjective:   Nathaniel [name] (DOB: Jul 17, 2000) is a 19 y.o. male who returns to [facility] on 10/10/2019 in re-evaluation of the following:  HPI: Nathaniel [name] returns to this clinic in evaluation of asthma and allergic rhinitis.  His last visit to this clinic was 12 Sep 2019 at which point in time we addressed his increased asthma activity by having him use a triple inhaler and addressed his increased nasal issues by having him use a nasal steroid.  He has really done very well and believes that he has had the best breathing he has had in a prolonged period in time.  He is now able to workout without any difficulty.  He has not had to use a short acting bronchodilator.  His nose is doing very well and he has very little nasal congestion.  Allergies as of 10/10/2019      Reactions   Amoxicillin Shortness Of Breath, Swelling      Medication List      albuterol 108 (90 Base) MCG/ACT inhaler Commonly known as: Ventolin HFA INHALE 2 PUFFS INTO THE LUNGS EVERY 6 (SIX) HOURS AS NEEDED. FOR WHEEZING OR SHORTNESS OF BREATH   Breztri Aerosphere 160-9-4.8 MCG/ACT Aero Generic drug: Budeson-Glycopyrrol-Formoterol 2 inhalations 2 times per day   cetirizine 10 MG tablet Commonly known as: ZYRTEC Take 10 mg by mouth daily.   VITAMIN D PO Take 2,000 Units by mouth daily.       Past Medical History:  Diagnosis Date  . Allergic rhinitis   . Chronic tonsillar hypertrophy    ENT did tonsillectomy 2020  . Elevated blood pressure reading without diagnosis of hypertension   . IFG (impaired fasting glucose) 12/2017   Gluc 107, HbA1c 5.5%  . Obesity, Class II, BMI 35-39.9   . OSA (obstructive sleep apnea) 02/2018   Sleep study recommended by Dr. Keane Police considering it.  . Severe  persistent asthma    Dr. Carmelina Peal    Past Surgical History:  Procedure Laterality Date  . TONSILLECTOMY  06/03/2018    Review of systems negative except as noted in HPI / PMHx or noted below:  Review of Systems  Constitutional: Negative.   HENT: Negative.   Eyes: Negative.   Respiratory: Negative.   Cardiovascular: Negative.   Gastrointestinal: Negative.   Genitourinary: Negative.   Musculoskeletal: Negative.   Skin: Negative.   Neurological: Negative.   Endo/Heme/Allergies: Negative.   Psychiatric/Behavioral: Negative.      Objective:   Vitals:   10/10/19 1648  BP: 118/72  Pulse: (!) 102  Resp: 16  Temp: 98 F (36.7 C)  SpO2: 98%          Physical Exam Constitutional:      Appearance: He is not diaphoretic.  HENT:     Head: Normocephalic.     Right Ear: Tympanic membrane, ear canal and external ear normal.     Left Ear: Tympanic membrane, ear canal and external ear normal.     Nose: Nose normal. No mucosal edema or rhinorrhea.     Mouth/Throat:     Pharynx: Uvula midline. No oropharyngeal exudate.  Eyes:     Conjunctiva/sclera: Conjunctivae normal.  Neck:     Thyroid: No thyromegaly.     Trachea: Trachea normal. No tracheal tenderness or tracheal deviation.  Cardiovascular:     Rate and Rhythm: Normal rate and regular rhythm.     Heart sounds: Normal heart sounds, S1 normal and S2 normal. No murmur.  Pulmonary:     Effort: No respiratory distress.     Breath sounds: Normal breath sounds. No stridor. No wheezing or rales.  Lymphadenopathy:     Head:     Right side of head: No tonsillar adenopathy.     Left side of head: No tonsillar adenopathy.     Cervical: No cervical adenopathy.  Skin:    Findings: No erythema or rash.     Nails: There is no clubbing.  Neurological:     Mental Status: He is alert.     Diagnostics:    Spirometry was performed and demonstrated an FEV1 of 4.12 at 85 % of predicted.  The patient had an Asthma Control Test  with the following results: ACT Total Score: 24.    Assessment and Plan:   1. Asthma, severe persistent, well-controlled   2. Other allergic rhinitis     1. Continue Breztri - 2 inhalations 1-2 times per day depending on disease activity  2. Continue OTC Nasacort - 1 spray each nostril 1-2 times per day depending on disease activity  3. Continue ProAir HFA 2 puffs every 4-6 hours if needed  4. Continue Zyrtec 10 mg once a day if needed  5. Return to clinic in 12 weeks or earlier if problem  Nathaniel [name] is really doing quite well at this point in time.  I think that over the course of the next several months he needs to determine the dose of anti-inflammatory agents required to control his respiratory tract inflammation while aiming for the least amount of medication required to do so.  Hopefully by using his medications 1 time per day he will maintain good control of his respiratory tract inflammation but if not he can always go up to twice a day use on both his combination inhaler and nasal steroid.  I will see him back in his clinic in 12 weeks or earlier if there is a problem.  Laurette Schimke, MD Allergy / Immunology [facility]

## 2019-10-10 NOTE — Patient Instructions (Signed)
  1. Continue Breztri - 2 inhalations 1-2 times per day depending on disease activity  2. Continue OTC Nasacort - 1 spray each nostril 1-2 times per day depending on disease activity  3. Continue ProAir HFA 2 puffs every 4-6 hours if needed  4. Continue Zyrtec 10 mg once a day if needed  5. Return to clinic in 12 weeks or earlier if problem

## 2019-10-11 ENCOUNTER — Encounter: Payer: Self-pay | Admitting: Allergy and Immunology

## 2020-01-02 ENCOUNTER — Other Ambulatory Visit: Payer: Self-pay

## 2020-01-02 ENCOUNTER — Encounter: Payer: Self-pay | Admitting: Allergy and Immunology

## 2020-01-02 ENCOUNTER — Ambulatory Visit (INDEPENDENT_AMBULATORY_CARE_PROVIDER_SITE_OTHER): Payer: No Typology Code available for payment source | Admitting: Allergy and Immunology

## 2020-01-02 VITALS — BP 140/68 | HR 133 | Resp 18

## 2020-01-02 DIAGNOSIS — J3089 Other allergic rhinitis: Secondary | ICD-10-CM

## 2020-01-02 DIAGNOSIS — J4550 Severe persistent asthma, uncomplicated: Secondary | ICD-10-CM

## 2020-01-02 MED ORDER — BREZTRI AEROSPHERE 160-9-4.8 MCG/ACT IN AERO: INHALATION_SPRAY | RESPIRATORY_TRACT | 5 refills | Status: DC

## 2020-01-02 MED ORDER — ALBUTEROL SULFATE HFA 108 (90 BASE) MCG/ACT IN AERS: INHALATION_SPRAY | RESPIRATORY_TRACT | 1 refills | Status: DC

## 2020-01-02 NOTE — Progress Notes (Signed)
[place] - [place]   Follow-up Note  Referring Provider: Jeoffrey Massed, MD Primary Provider: Jeoffrey Massed, MD Date of Office Visit: 01/02/2020  Subjective:   Nathaniel [name] (DOB: December 08, 2000) is a 19 y.o. male who [facility] 01[facility]02/2020 in re-evaluation of the following:  HPI: Nathaniel [name] returns to this clinic in reevaluation of asthma and allergic rhinitis.  His last visit to this clinic was 10 October 2019.  He has really done well with his asthma while using his triple inhaler mostly 1 time per day.  Rarely does use a short acting bronchodilator and he can exert himself without any problem and has not required a systemic steroid to treat an exacerbation.  Likewise, his nose has really been doing quite well and he rarely uses any nasal steroid at this point.  He has received 2 Pfizer Covid vaccinations.  Allergies as of 01/02/2020      Reactions   Amoxicillin Shortness Of Breath, Swelling      Medication List      albuterol 108 (90 Base) MCG/ACT inhaler Commonly known as: Ventolin HFA INHALE 2 PUFFS INTO THE LUNGS EVERY 6 (SIX) HOURS AS NEEDED. FOR WHEEZING OR SHORTNESS OF BREATH   Breztri Aerosphere 160-9-4.8 MCG/ACT Aero Generic drug: Budeson-Glycopyrrol-Formoterol 2 inhalations 2 times per day   cetirizine 10 MG tablet Commonly known as: ZYRTEC Take 10 mg by mouth daily.   VITAMIN D PO Take 2,000 Units by mouth daily.       Past Medical History:  Diagnosis Date  . Allergic rhinitis   . Chronic tonsillar hypertrophy    ENT did tonsillectomy 2020  . Elevated blood pressure reading without diagnosis of hypertension   . IFG (impaired fasting glucose) 12/2017   Gluc 107, HbA1c 5.5%  . Obesity, Class II, BMI 35-39.9   . OSA (obstructive sleep apnea) 02/2018   Sleep study recommended by Dr. Ivory Broad considering it.  . Severe persistent asthma    Dr. Sharyn Lull    Past Surgical History:   Procedure Laterality Date  . TONSILLECTOMY  06/03/2018    Review of systems negative except as noted in HPI / PMHx or noted below:  Review of Systems  Constitutional: Negative.   HENT: Negative.   Eyes: Negative.   Respiratory: Negative.   Cardiovascular: Negative.   Gastrointestinal: Negative.   Genitourinary: Negative.   Musculoskeletal: Negative.   Skin: Negative.   Neurological: Negative.   Endo/Heme/Allergies: Negative.   Psychiatric/Behavioral: Negative.      Objective:   Vitals:   01/02/20 1658  BP: 140/68  Pulse: (!) 133  Resp: 18  SpO2: 98%          Physical Exam Constitutional:      Appearance: He is not diaphoretic.  HENT:     Head: Normocephalic.     Right Ear: Tympanic membrane, ear canal and external ear normal.     Left Ear: Tympanic membrane, ear canal and external ear normal.     Nose: Nose normal. No mucosal edema or rhinorrhea.     Mouth/Throat:     Pharynx: Uvula midline. No oropharyngeal exudate.  Eyes:     Conjunctiva/sclera: Conjunctivae normal.  Neck:     Thyroid: No thyromegaly.     Trachea: Trachea normal. No tracheal tenderness or tracheal deviation.  Cardiovascular:     Rate and Rhythm: Normal rate and regular rhythm.     Heart sounds: Normal heart sounds, S1  normal and S2 normal. No murmur heard.   Pulmonary:     Effort: No respiratory distress.     Breath sounds: Normal breath sounds. No stridor. No wheezing or rales.  Lymphadenopathy:     Head:     Right side of head: No tonsillar adenopathy.     Left side of head: No tonsillar adenopathy.     Cervical: No cervical adenopathy.  Skin:    Findings: No erythema or rash.     Nails: There is no clubbing.  Neurological:     Mental Status: He is alert.     Diagnostics:    Spirometry was performed and demonstrated an FEV1 of 4.14 at 87 % of predicted.   Assessment and Plan:   1. Asthma, severe persistent, well-controlled   2. Other allergic rhinitis     1.  Continue Breztri - 2 inhalations 1-2 times per day depending on disease activity  2. Continue OTC Nasacort - 1 spray each nostril 1-2 times per day depending on disease activity  3. Continue ProAir HFA 2 puffs every 4-6 hours if needed  4. Continue Zyrtec 10 mg once a day if needed  5. Obtain fall flu vaccine and Covid booster when available  6. Return to clinic in 6 months or earlier if problem  Nathaniel [name] really appears to be doing quite well on his current therapy and he has a very good understanding of his medications and how they work and appropriate dosing of his medications depending on his disease activity.  We will keep him on the plan noted above and see him back in this clinic in 6 months or earlier if there is a problem.  Laurette Schimke, MD Allergy / Immunology [facility]

## 2020-01-02 NOTE — Patient Instructions (Addendum)
  1. Continue Breztri - 2 inhalations 1-2 times per day depending on disease activity  2. Continue OTC Nasacort - 1 spray each nostril 1-2 times per day depending on disease activity  3. Continue ProAir HFA 2 puffs every 4-6 hours if needed  4. Continue Zyrtec 10 mg once a day if needed  5. Obtain fall flu vaccine and Covid booster when available  6. Return to clinic in 6 months or earlier if problem

## 2020-01-03 ENCOUNTER — Encounter: Payer: Self-pay | Admitting: Allergy and Immunology

## 2020-03-13 ENCOUNTER — Other Ambulatory Visit: Payer: Self-pay

## 2020-03-13 ENCOUNTER — Encounter (HOSPITAL_COMMUNITY): Payer: Self-pay | Admitting: Psychiatry

## 2020-03-13 ENCOUNTER — Inpatient Hospital Stay (HOSPITAL_COMMUNITY)
Admission: RE | Admit: 2020-03-13 | Discharge: 2020-03-18 | DRG: 897 | Disposition: A | Discharge status: Home / Self Care | Payer: No Typology Code available for payment source | Attending: Psychiatry | Admitting: Psychiatry

## 2020-03-13 DIAGNOSIS — Z79899 Other long term (current) drug therapy: Secondary | ICD-10-CM | POA: Diagnosis not present

## 2020-03-13 DIAGNOSIS — Z8249 Family history of ischemic heart disease and other diseases of the circulatory system: Secondary | ICD-10-CM

## 2020-03-13 DIAGNOSIS — F19959 Other psychoactive substance use, unspecified with psychoactive substance-induced psychotic disorder, unspecified: Secondary | ICD-10-CM | POA: Diagnosis present

## 2020-03-13 DIAGNOSIS — F23 Brief psychotic disorder: Secondary | ICD-10-CM

## 2020-03-13 DIAGNOSIS — F411 Generalized anxiety disorder: Secondary | ICD-10-CM | POA: Diagnosis present

## 2020-03-13 DIAGNOSIS — Z8042 Family history of malignant neoplasm of prostate: Secondary | ICD-10-CM | POA: Diagnosis not present

## 2020-03-13 DIAGNOSIS — Z20822 Contact with and (suspected) exposure to covid-19: Secondary | ICD-10-CM | POA: Diagnosis present

## 2020-03-13 DIAGNOSIS — I10 Essential (primary) hypertension: Secondary | ICD-10-CM | POA: Diagnosis present

## 2020-03-13 DIAGNOSIS — J4550 Severe persistent asthma, uncomplicated: Secondary | ICD-10-CM | POA: Diagnosis present

## 2020-03-13 DIAGNOSIS — F32A Depression, unspecified: Secondary | ICD-10-CM | POA: Diagnosis present

## 2020-03-13 DIAGNOSIS — F1290 Cannabis use, unspecified, uncomplicated: Secondary | ICD-10-CM | POA: Diagnosis present

## 2020-03-13 LAB — RESPIRATORY PANEL BY RT PCR (FLU A&B, COVID)
Influenza A by PCR: NEGATIVE
Influenza B by PCR: NEGATIVE
SARS Coronavirus 2 by RT PCR: NEGATIVE

## 2020-03-13 MED ORDER — HALOPERIDOL LACTATE 5 MG/ML IJ SOLN
10.0000 mg | Freq: Two times a day (BID) | INTRAMUSCULAR | Status: DC | PRN
  Filled 2020-03-13: qty 2

## 2020-03-13 MED ORDER — MAGNESIUM HYDROXIDE 400 MG/5ML PO SUSP
30.0000 mL | Freq: Every day | ORAL | Status: DC | PRN
  Filled 2020-03-13: qty 30

## 2020-03-13 MED ORDER — ALUM & MAG HYDROXIDE-SIMETH 200-200-20 MG/5ML PO SUSP
30.0000 mL | ORAL | Status: DC | PRN
  Filled 2020-03-13: qty 30

## 2020-03-13 MED ORDER — FLUTICASONE FUROATE-VILANTEROL 200-25 MCG/INH IN AEPB
1.0000 | INHALATION_SPRAY | Freq: Every day | RESPIRATORY_TRACT | Status: DC
  Administered 2020-03-15 – 2020-03-18 (×4): 1 via RESPIRATORY_TRACT
  Filled 2020-03-13: qty 28

## 2020-03-13 MED ORDER — LORAZEPAM 2 MG/ML IJ SOLN
1.0000 mg | Freq: Two times a day (BID) | INTRAMUSCULAR | Status: DC | PRN
  Filled 2020-03-13: qty 0.5

## 2020-03-13 MED ORDER — MINOCYCLINE HCL 100 MG PO CAPS
100.0000 mg | ORAL_CAPSULE | Freq: Two times a day (BID) | ORAL | Status: DC
  Administered 2020-03-14 – 2020-03-18 (×9): 100 mg via ORAL
  Filled 2020-03-13 (×14): qty 1

## 2020-03-13 MED ORDER — ALBUTEROL SULFATE HFA 108 (90 BASE) MCG/ACT IN AERS
1.0000 | INHALATION_SPRAY | Freq: Four times a day (QID) | RESPIRATORY_TRACT | Status: DC | PRN
  Administered 2020-03-14 – 2020-03-17 (×2): 2 via RESPIRATORY_TRACT
  Filled 2020-03-13 (×3): qty 6.7

## 2020-03-13 MED ORDER — TRIAMCINOLONE ACETONIDE 55 MCG/ACT NA AERO
2.0000 | INHALATION_SPRAY | Freq: Every day | NASAL | Status: DC
  Filled 2020-03-13: qty 21.6

## 2020-03-13 MED ORDER — HYDROXYZINE HCL 25 MG PO TABS
25.0000 mg | ORAL_TABLET | Freq: Three times a day (TID) | ORAL | Status: DC | PRN
  Administered 2020-03-14 – 2020-03-16 (×5): 25 mg via ORAL
  Filled 2020-03-13 (×7): qty 1

## 2020-03-13 MED ORDER — FLUTICASONE PROPIONATE 50 MCG/ACT NA SUSP
2.0000 | Freq: Every day | NASAL | Status: DC
  Administered 2020-03-15 – 2020-03-18 (×4): 2 via NASAL
  Filled 2020-03-13: qty 16

## 2020-03-13 MED ORDER — OLANZAPINE 10 MG PO TBDP
10.0000 mg | ORAL_TABLET | Freq: Two times a day (BID) | ORAL | Status: DC | PRN
  Administered 2020-03-16: 10 mg via ORAL
  Filled 2020-03-13 (×3): qty 1

## 2020-03-13 MED ORDER — LORAZEPAM 1 MG PO TABS
1.0000 mg | ORAL_TABLET | Freq: Two times a day (BID) | ORAL | Status: DC | PRN
  Administered 2020-03-14 – 2020-03-16 (×3): 1 mg via ORAL
  Filled 2020-03-13 (×4): qty 1

## 2020-03-13 MED ORDER — TRAZODONE HCL 50 MG PO TABS
50.0000 mg | ORAL_TABLET | Freq: Every evening | ORAL | Status: DC | PRN
  Administered 2020-03-14 – 2020-03-17 (×4): 50 mg via ORAL
  Filled 2020-03-13 (×6): qty 1

## 2020-03-13 MED ORDER — OLANZAPINE 10 MG PO TBDP
10.0000 mg | ORAL_TABLET | Freq: Once | ORAL | Status: AC
  Administered 2020-03-13: 10 mg via ORAL
  Filled 2020-03-13: qty 1

## 2020-03-13 MED ORDER — LORAZEPAM 1 MG PO TABS
1.0000 mg | ORAL_TABLET | Freq: Once | ORAL | Status: AC
  Administered 2020-03-13: 1 mg via ORAL
  Filled 2020-03-13: qty 1

## 2020-03-13 MED ORDER — CHOLECALCIFEROL 10 MCG (400 UNIT) PO TABS
400.0000 [IU] | ORAL_TABLET | Freq: Every day | ORAL | Status: DC
  Administered 2020-03-14 – 2020-03-18 (×5): 400 [IU] via ORAL
  Filled 2020-03-13 (×7): qty 1

## 2020-03-13 MED ORDER — ACETAMINOPHEN 325 MG PO TABS
650.0000 mg | ORAL_TABLET | Freq: Four times a day (QID) | ORAL | Status: DC | PRN
  Administered 2020-03-14: 650 mg via ORAL
  Filled 2020-03-13 (×2): qty 2

## 2020-03-13 MED ORDER — LORATADINE 10 MG PO TABS
10.0000 mg | ORAL_TABLET | Freq: Every day | ORAL | Status: DC
  Administered 2020-03-15 – 2020-03-18 (×4): 10 mg via ORAL
  Filled 2020-03-13 (×7): qty 1

## 2020-03-13 NOTE — H&P (Signed)
Behavioral Health Medical Screening Exam  Nathaniel [name] is an 19 y.o. male with no psychiatric history. He was referred here by PCP, who saw him today (see note below). Patient and mother report onset of mood lability with delusions and hallucinations this past Friday. Patient apparently has a history of one prior brief episode of psychosis about one year ago while using marijuana, and stopped marijuana for some time afterwards. He has resumed marijuana use this semester and reports onset of symptoms after smoking on Friday. He has not used any marijuana since Friday.   Per PCP's note, patient believed himself to be 25 or 26. When asked his age on this assessment, patient states he is 23. He admits that he has been feeling like he is [age] the last few days because his mother has always called him an "old soul." He also admits to paranoia regarding his father, remembering an old photo of himself as a baby in which he was crying, and feeling this was because his father was against him. Patient admits that he has been having "weird thoughts" that feel real but able to verbalize these thoughts are not based in reality. He reports symptoms have improved since Friday night. Denies AVH. He reports poor sleep. He admits to thoughts of self-harm yesterday "but that was because I got too emotional" and denies ever having a plan. Denies current SI. Denies HI. Denies other drug use. He is anxious and notably tangential with some thought blocking.  We discussed possibility that recent marijuana was laced, and also discussed marijuana's association with psychosis in some individuals. I have recommended patient abstain from marijuana use, and patient does express desire to stop using marijuana. He is agreeable to overnight observation and starting medication  Per PCP's note: Patient here with mother. Cooperative. Mother brought him here because "he is not acting like himself". Patient reports that he has "no logical  content" and believes his life is being controlled externally. He has recently been hearing voices but does not describe the content of these voices. He additionally is not sure of his personal identity meaning that he cannot verify his name. He believes himself to actually be 46 or 19 years old rather than the reported age of 35. He believes that all documentation in his life has been fraudulent. He believes that he has "had a break" 2 to [employer] shock". He reports thoughts of harming himself without a specific plan. He will not elaborate further on those thoughts but reports he will not harm himself or others. He believes his father is plotting against him and "trying to write the letters of my life". He reports a similar episode once last year. He denies headache, chest pain, shortness of breath, fever chills or sweats, abnormal eating. He reports frequent cannabis use. He reports a desire to get help.   Total Time spent with patient: 30 minutes  Psychiatric Specialty Exam: Physical Exam Vitals and nursing note reviewed.  Constitutional:      Appearance: He is well-developed.  Pulmonary:     Effort: Pulmonary effort is normal.  Musculoskeletal:        General: Normal range of motion.  Neurological:     Mental Status: He is alert and oriented to person, place, and time.    Review of Systems  Constitutional: Negative.   Respiratory: Negative for cough and shortness of breath.   Gastrointestinal: Negative for diarrhea, nausea and vomiting.  Neurological: Negative for headaches.  Psychiatric/Behavioral: Positive for sleep disturbance. Negative  for agitation, behavioral problems, confusion, decreased concentration, dysphoric mood, hallucinations, self-injury and suicidal ideas. The patient is nervous/anxious. The patient is not hyperactive.    Blood pressure (!) 152/100, pulse (!) 104, temperature 98.9 F (37.2 C), temperature source Oral, resp. rate 18, SpO2 100 %.There is no height or  weight on file to calculate BMI. General Appearance: Casual Eye Contact:  Fair Speech:  Slow Volume:  Normal Mood:  Anxious Affect:  Congruent Thought Process:  Coherent Orientation:  Full (Time, Place, and Person) Thought Content:  Delusions, Paranoid Ideation and Tangential Suicidal Thoughts:  No Homicidal Thoughts:  No Memory:  Immediate;   Fair Recent;   Fair Remote;   Fair Judgement:  Intact Insight:  Fair Psychomotor Activity:  Normal Concentration: Concentration: Fair and Attention Span: Fair Recall:  [employer] of Knowledge:Fair Language: Good Akathisia:  No Handed:  Right AIMS (if indicated):    Assets:  Communication Skills Desire for Improvement Financial Resources/Insurance Housing Vocational/Educational Sleep:     Musculoskeletal: Strength & Muscle Tone: within normal limits Gait & Station: normal Patient leans: N/A  Blood pressure (!) 152/100, pulse (!) 104, temperature 98.9 F (37.2 C), temperature source Oral, resp. rate 18, SpO2 100 %.  Recommendations: Based on my evaluation the patient does not appear to have an emergency medical condition.  Overnight observation.  Aldean Baker, NP 03/13/2020, 4:11 PM
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
## 2020-03-13 NOTE — Progress Notes (Signed)
While waiting for transportation, patient suddenly became agitated, paranoid, and verbally aggressive with his mother in the assessment room, expressing delusion that his birthday is not real. Patient agrees to inpatient hospitalization.

## 2020-03-13 NOTE — Tx Team (Signed)
Initial Treatment Plan 03/13/2020 11:55 PM Nathaniel [name] [id number]    PATIENT STRESSORS: Marital or family conflict Other: School Work   PATIENT STRENGTHS: General fund of knowledge Supportive family/friends   PATIENT IDENTIFIED PROBLEMS: Poor coping skills (Increasing Anxiety and poor concentration)  Risk for suicide (Passive SI)  Per Pt. "Nothing, I don't think I need to be in here"                 DISCHARGE CRITERIA:  Ability to meet basic life and health needs Improved stabilization in mood, thinking, and/or behavior Motivation to continue treatment in a less acute level of care Need for constant or close observation no longer present Verbal commitment to aftercare and medication compliance  PRELIMINARY DISCHARGE PLAN: Attend aftercare/continuing care group Attend PHP/IOP Participate in family therapy Return to previous living arrangement Return to previous work or school arrangements  PATIENT/FAMILY INVOLVEMENT: This treatment plan has been presented to and reviewed with the patient, Nathaniel [name]  The patient and family have been given the opportunity to ask questions and make suggestions.  Tressia Miners Alline Pio, RN 03/13/2020, 11:55 PM
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
## 2020-03-14 DIAGNOSIS — F411 Generalized anxiety disorder: Secondary | ICD-10-CM

## 2020-03-14 DIAGNOSIS — F19959 Other psychoactive substance use, unspecified with psychoactive substance-induced psychotic disorder, unspecified: Principal | ICD-10-CM

## 2020-03-14 LAB — HEMOGLOBIN A1C
Hgb A1c MFr Bld: 5.5 % (ref 4.8–5.6)
Mean Plasma Glucose: 111.15 mg/dL

## 2020-03-14 LAB — COMPREHENSIVE METABOLIC PANEL
ALT: 83 U/L — ABNORMAL HIGH (ref 0–44)
AST: 47 U/L — ABNORMAL HIGH (ref 15–41)
Albumin: 4.8 g/dL (ref 3.5–5.0)
Alkaline Phosphatase: 69 U/L (ref 38–126)
Anion gap: 10 (ref 5–15)
BUN: 13 mg/dL (ref 6–20)
CO2: 26 mmol/L (ref 22–32)
Calcium: 9.9 mg/dL (ref 8.9–10.3)
Chloride: 107 mmol/L (ref 98–111)
Creatinine, Ser: 0.84 mg/dL (ref 0.61–1.24)
GFR, Estimated: >60 mL/min (ref >60)
Glucose, Bld: 96 mg/dL (ref 70–99)
Potassium: 3.8 mmol/L (ref 3.5–5.1)
Sodium: 143 mmol/L (ref 135–145)
Total Bilirubin: 0.8 mg/dL (ref 0.3–1.2)
Total Protein: 8.4 g/dL — ABNORMAL HIGH (ref 6.5–8.1)

## 2020-03-14 LAB — CBC
HCT: 46.3 % (ref 39.0–52.0)
Hemoglobin: 15.6 g/dL (ref 13.0–17.0)
MCH: 30.2 pg (ref 26.0–34.0)
MCHC: 33.7 g/dL (ref 30.0–36.0)
MCV: 89.7 fL (ref 80.0–100.0)
Platelets: 337 10*3/uL (ref 150–400)
RBC: 5.16 MIL/uL (ref 4.22–5.81)
RDW: 13 % (ref 11.5–15.5)
WBC: 12.1 10*3/uL — ABNORMAL HIGH (ref 4.0–10.5)
nRBC: 0 % (ref 0.0–0.2)

## 2020-03-14 LAB — URINALYSIS, ROUTINE W REFLEX MICROSCOPIC
Bilirubin Urine: NEGATIVE
Glucose, UA: NEGATIVE mg/dL
Hgb urine dipstick: NEGATIVE
Ketones, ur: NEGATIVE mg/dL
Leukocytes,Ua: NEGATIVE
Nitrite: NEGATIVE
Protein, ur: 30 mg/dL — AB
Specific Gravity, Urine: 1.027 (ref 1.005–1.030)
pH: 5 (ref 5.0–8.0)

## 2020-03-14 LAB — RAPID URINE DRUG SCREEN, HOSP PERFORMED
Amphetamines: NOT DETECTED
Barbiturates: NOT DETECTED
Benzodiazepines: NOT DETECTED
Cocaine: NOT DETECTED
Opiates: NOT DETECTED
Tetrahydrocannabinol: POSITIVE — AB

## 2020-03-14 LAB — LIPID PANEL
Cholesterol: 180 mg/dL (ref 0–200)
HDL: 33 mg/dL — ABNORMAL LOW (ref >40)
LDL Cholesterol: 120 mg/dL — ABNORMAL HIGH (ref 0–99)
Total CHOL/HDL Ratio: 5.5 RATIO
Triglycerides: 136 mg/dL (ref <150)
VLDL: 27 mg/dL (ref 0–40)

## 2020-03-14 LAB — TSH: TSH: 5.616 u[IU]/mL — ABNORMAL HIGH (ref 0.350–4.500)

## 2020-03-14 LAB — T4, FREE: Free T4: 1.3 ng/dL — ABNORMAL HIGH (ref 0.61–1.12)

## 2020-03-14 LAB — ETHANOL: Alcohol, Ethyl (B): <10 mg/dL (ref <10)

## 2020-03-14 MED ORDER — OLANZAPINE 5 MG PO TABS
5.0000 mg | ORAL_TABLET | Freq: Two times a day (BID) | ORAL | Status: DC
  Administered 2020-03-14 – 2020-03-15 (×3): 5 mg via ORAL
  Filled 2020-03-14 (×5): qty 1
  Filled 2020-03-14: qty 2

## 2020-03-14 MED ORDER — ESCITALOPRAM OXALATE 10 MG PO TABS
10.0000 mg | ORAL_TABLET | Freq: Every day | ORAL | Status: DC
  Administered 2020-03-14 – 2020-03-18 (×5): 10 mg via ORAL
  Filled 2020-03-14 (×7): qty 1
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
## 2020-03-14 NOTE — [facility] Group Notes (Signed)
Occupational Therapy Group Note Date: 03/14/2020 Group Topic/Focus: Goal-Setting and Socialization/Social Skills  Group Description: Group encouraged increased engagement and participation through discussion focused on goal-setting and age appropriate socialization. Patients were prompted to fill out a worksheet and respond to three prompts: one thing I want you to know about me is, the hardest thing I am dealing with today, and my goal for today. Discussion then focused on patients sharing their responses and engaging socially as they offered support to their peers.  Participation Level: Active   Participation Quality: Independent   Behavior: Calm, Cooperative and Interactive   Speech/Thought Process: Focused   Affect/Mood: Euthymic   Insight: Fair   Judgement: Fair   Individualization: Nathaniel [name] was active in his participation of discussion and activity. Pt identified "recognizing the difference between my reality and the real reality" as the hardest thing he is dealing with today, however shared that he is managing through "medication" and "putting myself first." He identified his goal for the day "focus on my continued self growth." Pt supportive of peers and offered additional strategies throughout discussion.   Modes of Intervention: Activity, Discussion and Socialization  Patient Response to Interventions:  Attentive and Engaged   Plan: Continue to engage patient in OT groups 2 - 3x/week.  03/14/2020  Donne Hazel, MOT, OTR/L

## 2020-03-14 NOTE — [facility] Group Notes (Signed)
The focus of this group is to help patients establish daily goals to achieve during treatment and discuss how the patient can incorporate goal setting into their daily lives to aide in recovery.  Pt did not attend group 

## 2020-03-14 NOTE — [facility] Suicide Risk Assessment (Signed)
[facility] Admission Suicide Risk Assessment   Nursing information obtained from:  Patient Demographic factors:  Male, Adolescent or young adult Current Mental Status:  NA Loss Factors:  NA Historical Factors:  Prior suicide attempts ("Thought about it once in middle school but didn't do it") Risk Reduction Factors:  Living with another person, especially a relative (No current SI or firearms)  Total Time spent with patient: 30 minutes Principal Problem: Psychoactive substance-induced psychosis (HCC) Diagnosis:  Principal Problem:   Psychoactive substance-induced psychosis (HCC) Active Problems:   Generalized anxiety disorder  Subjective Data:   [name] is a 19 yr old male who presents with substance induced psychosis. He has no significant PPHx.   Patient presented to the [facility] yesterday with his mother after being referred by his PCP for acting weird. Patient reporteedly started acting weird on Friday after smoking THC. Has one previous episode of similar acting after smoking THC one year ago. While at the PCP's office patient reported his age as 3-28 (age is 4). Had paranoia that his father was plotting against him. Reported having weird thoughts that felt weird but he could distinguish as not real. Reported thoughts of self harm.  On presentation today patient states that he had the best nights sleep he has had in years after receiving medication last night- received 10 mg zyprexa and 1 mg ativan for agitation after he became agitated/paranoid/verballly aggressive towards his mother . He reported eating dinner and breakfast well. He reports no SI/HI and no AVH today.   Continued Clinical Symptoms:  Alcohol Use Disorder Identification Test Final Score (AUDIT): 1 The "Alcohol Use Disorders Identification Test", Guidelines for Use in Primary Care, Second Edition.  World Science writer [facility]). Score between 0-7:  no or low risk or alcohol related problems. Score between 8-15:   moderate risk of alcohol related problems. Score between 16-19:  high risk of alcohol related problems. Score 20 or above:  warrants further diagnostic evaluation for alcohol dependence and treatment.   CLINICAL FACTORS:   Severe Anxiety and/or Agitation Alcohol/Substance Abuse/Dependencies   Musculoskeletal: Strength & Muscle Tone: within normal limits Gait & Station: normal Patient leans: N/A  Psychiatric Specialty Exam: Physical Exam Constitutional:      Appearance: Normal appearance. He is obese.  HENT:     Head: Normocephalic and atraumatic.  Eyes:     Extraocular Movements: Extraocular movements intact.  Pulmonary:     Effort: Pulmonary effort is normal.  Neurological:     Mental Status: He is alert.     Review of Systems  Constitutional: Negative for appetite change.  Eyes: Negative for redness.  Respiratory: Negative for shortness of breath.   Cardiovascular: Negative for chest pain.  Gastrointestinal: Negative for abdominal pain.  Psychiatric/Behavioral: Positive for dysphoric mood. Negative for agitation, hallucinations and suicidal ideas. The patient is nervous/anxious.     Blood pressure (!) 163/106, pulse (!) 116, temperature 97.6 F (36.4 C), temperature source Oral, resp. rate 18, height 6' (1.829 m), weight (!) 147 kg, SpO2 100 %.Body mass index is 43.94 kg/m.  General Appearance: Casual and Disheveled  Eye Contact:  intermittent, fair  Speech:  Clear and Coherent and rapid  Volume:  Normal  Mood:  "better"  Affect:  Constricted and anxious,  Thought Process:  Generally linear,  circumstantial/tangential/disorganized at times  Orientation:  Full (Time, Place, and Person)  Thought Content:  Logical and Rumination. Rumination on his parents and h/o child hood trauma that he states he is unable to remember  Suicidal Thoughts:  No  Homicidal Thoughts:  No  Memory:  Immediate;   Good Recent;   Fair Remote;   Fair  Judgement:  Fair  Insight:  fair   Psychomotor Activity:  fidgety, increased  Concentration:  Concentration: Fair and Attention Span: Fair  Recall:  [employer] of Knowledge:  Good  Language:  Good  Akathisia:  No  Handed:  Right  AIMS (if indicated):     Assets:  Communication Skills Desire for Improvement Housing Vocational/Educational  ADL's:  Intact  Cognition:  WNL  Sleep:  Number of Hours: 4.75      COGNITIVE FEATURES THAT CONTRIBUTE TO RISK:  Thought constriction (tunnel vision)    SUICIDE RISK:   Minimal: No identifiable suicidal ideation.  Patients presenting with no risk factors but with morbid ruminations; may be classified as minimal risk based on the severity of the depressive symptoms  PLAN OF CARE:  19 yo male with h/o SIPD, cannabis use, anxiety, depression who presented to [facility] with his mother at the behest of his PCP after displaying ssx c/w psychosis (delusions and paranoia). Pt initially reported that he was being externally controlled and that he believed documentation had been falsified about his DOB and that he was not actually 19 yo. Patient was initially to be transferred to the [facility] for observation, but became aggressive, paranoid and threatening towards his mother while awaiting transportation. Patient was then admitted to the unoit for safety and stabilization. He received zyprexa 5 mg and ativan 1 mg yesterday. This AM, patient's delusional TC has improved, is aware that he is 19 yo although still somewhat bizarre, evasive, and extremely anxious. Pt focused on his "traumatic childhood" that he is unable to recall. He admits to regular marijuana use and acknodges that it worsens paranoia, anxiety and mood and would like to cease use. Pt reports high baseline anxiety and depression and cites these as the reasons for his marijuana use and is interested in treatment. After discussing r/b/se/ae of SSRIs, amenable to begin trial of lexapro, will also adjust zyprexa to 5 mg BID as pt with high anxiety  this AM. Suspect that psychosis is at least partially d/t substances use (UDS+THC) as it has improved considerably with metabolization/washout; however, still with lingering psychotic sx.   I certify that inpatient services furnished can reasonably be expected to improve the patient's condition.   Estella Husk, MD 03/14/2020, 1:36 PM

## 2020-03-14 NOTE — H&P (Signed)
Psychiatric Admission Assessment Adult  Patient Identification: Nathaniel [name] MRN:  [id number] Date of Evaluation:  03/14/2020 Chief Complaint:  Acute psychosis (HCC) [F23] Principal Diagnosis: Psychoactive substance-induced psychosis (HCC) Diagnosis:  Principal Problem:   Psychoactive substance-induced psychosis (HCC) Active Problems:   Generalized anxiety disorder  History of Present Illness:  Nathaniel [name] is a 19 yr old male who presents with substance induced psychosis. He has no significant PPHx.   Patient presented to the [facility] yesterday with his mother after being referred by his PCP for acting weird. Patient reporteedly started acting weird on Friday after smoking THC. Has one previous episode of similar acting after smoking THC one year ago. While at the PCP's office patient reported his age as [age] (age is 83). Had paranoia that his father was plotting against him. Reported having weird thoughts that felt weird but he could distinguish as not real. Reported thoughts of self harm.  On presentation today patient states that he had the best nights sleep he has had in years after receiving medication last night. He reported eating dinner and breakfast well. He reports no SI/HI and no AVH today. He reports that he has been using a health and wellness app from his [employer] and been scheduled with a counselor and plans to meet with them for the long term. Patient also reports wanting to start medications if they will help him.  During interview patient would stare out of the window. He stated he was feeling anxious and that the leaves were calming. He would have occasional face twitches. He would constantly shift back and forth while talking. He stated that he knew he was 19. He reports he intends to stop smoking THC. Appeared to be intellectualizing his current situation.  Associated Signs/Symptoms: Depression Symptoms:  psychomotor agitation, Duration of Depression Symptoms: No data  recorded (Hypo) Manic Symptoms:  NA Anxiety Symptoms:  Excessive Worry, Panic Symptoms, Psychotic Symptoms:  Delusions, Paranoia, Duration of Psychotic Symptoms: No data recorded PTSD Symptoms: NA Total Time spent with patient: 20 minutes  Past Psychiatric History: None  Is the patient at risk to self? No.  Has the patient been a risk to self in the past 6 months? No.  Has the patient been a risk to self within the distant past? No.  Is the patient a risk to others? No.  Has the patient been a risk to others in the past 6 months? No.  Has the patient been a risk to others within the distant past? No.   Prior Inpatient Therapy: Prior Inpatient Therapy: No Prior Outpatient Therapy: Prior Outpatient Therapy: No Does patient have an ACCT team?: No Does patient have Intensive In-House Services?  : No Does patient have [facility] services? : No Does patient have P4CC services?: No  Alcohol Screening: 1. How often do you have a drink containing alcohol?: Monthly or less 2. How many drinks containing alcohol do you have on a typical day when you are drinking?: 1 or 2 3. How often do you have six or more drinks on one occasion?: Never AUDIT-C Score: 1 4. How often during the last year have you found that you were not able to stop drinking once you had started?: Never 5. How often during the last year have you failed to do what was normally expected from you because of drinking?: Never 6. How often during the last year have you needed a first drink in the morning to get yourself going after a heavy drinking session?: Never 7. How  often during the last year have you had a feeling of guilt of remorse after drinking?: Never 8. How often during the last year have you been unable to remember what happened the night before because you had been drinking?: Never 9. Have you or someone else been injured as a result of your drinking?: No 10. Has a relative or friend or a doctor or another health worker  been concerned about your drinking or suggested you cut down?: No Alcohol Use Disorder Identification Test Final Score (AUDIT): 1 Alcohol Brief Interventions/Follow-up: Patient Refused Substance Abuse History in the last 12 months:  Yes.   Consequences of Substance Abuse: Medical Consequences:  Previous episode of Psychotic Symptoms Previous Psychotropic Medications: No  Psychological Evaluations: No  Past Medical History:  Past Medical History:  Diagnosis Date  . Allergic rhinitis   . Chronic tonsillar hypertrophy    ENT did tonsillectomy 2020  . Elevated blood pressure reading without diagnosis of hypertension   . IFG (impaired fasting glucose) 12/2017   Gluc 107, HbA1c 5.5%  . Obesity, Class II, BMI 35-39.9   . OSA (obstructive sleep apnea) 02/2018   Sleep study recommended by Dr. Ivory Broad considering it.  . Severe persistent asthma    Dr. Sharyn Lull    Past Surgical History:  Procedure Laterality Date  . TONSILLECTOMY  06/03/2018   Family History:  Family History  Problem Relation Age of Onset  . Heart attack Other   . Prostate cancer Other   . Hypertension Father   . Hypertension Maternal Grandfather   . Cancer Maternal Grandfather   . Asthma Neg Hx    Family Psychiatric  History: None Tobacco Screening: Have you used any form of tobacco in the last 30 days? (Cigarettes, Smokeless Tobacco, Cigars, and/or Pipes): No Social History:  Social History   Substance and Sexual Activity  Alcohol Use No     Social History   Substance and Sexual Activity  Drug Use No    Additional Social History: Marital status: Single    Pain Medications: denies Prescriptions: albuterol, Breztri aerosphere, zyrtec, vitamin D, Nasacort, minocycline History of alcohol / drug use?: Yes Name of Substance 1: thc 1 - Frequency: multiple times daily 1 - Duration: years, with break from use about a year ago when pt was having mild sx of psychosis. Mother reports pt cleared with discontinued  use of thc 1 - Last Use / Amount: friday, 03/08/20 Name of Substance 2: alcohol 2 - Amount (size/oz): binge 2 - Frequency: 1-2 x monthly 2 - Last Use / Amount: 2-3 days ago                Allergies:   Allergies  Allergen Reactions  . Amoxicillin Shortness Of Breath and Swelling   Lab Results:  Results for orders placed or performed during the hospital encounter of 03/13/20 (from the past 48 hour(s))  Respiratory Panel by RT PCR (Flu A&B, Covid) - Nasopharyngeal Swab     Status: None   Collection Time: 03/13/20  5:41 PM   Specimen: Nasopharyngeal Swab  Result Value Ref Range   SARS Coronavirus 2 by RT PCR NEGATIVE NEGATIVE    Comment: (NOTE) SARS-CoV-2 target nucleic acids are NOT DETECTED.  The SARS-CoV-2 RNA is generally detectable in upper respiratoy specimens during the acute phase of infection. The lowest concentration of SARS-CoV-2 viral copies this assay can detect is 131 copies/mL. A negative result does not preclude SARS-Cov-2 infection and should not be used as the sole basis for  treatment or other patient management decisions. A negative result may occur with  improper specimen collection/handling, submission of specimen other than nasopharyngeal swab, presence of viral mutation(s) within the areas targeted by this assay, and inadequate number of viral copies (<131 copies/mL). A negative result must be combined with clinical observations, patient history, and epidemiological information. The expected result is Negative.  Fact Sheet for Patients:  https://www.moore.com/  Fact Sheet for Healthcare Providers:  https://www.young.biz/  This test is no t yet approved or cleared by the [place] FDA and  has been authorized for detection and/or diagnosis of SARS-CoV-2 by FDA under an Emergency Use Authorization (EUA). This EUA will remain  in effect (meaning this test can be used) for the duration of the COVID-19  declaration under Section 564(b)(1) of the Act, 21 U.S.C. section 360bbb-3(b)(1), unless the authorization is terminated or revoked sooner.     Influenza A by PCR NEGATIVE NEGATIVE   Influenza B by PCR NEGATIVE NEGATIVE    Comment: (NOTE) The Xpert Xpress SARS-CoV-2/FLU/RSV assay is intended as an aid in  the diagnosis of influenza from Nasopharyngeal swab specimens and  should not be used as a sole basis for treatment. Nasal washings and  aspirates are unacceptable for Xpert Xpress SARS-CoV-2/FLU/RSV  testing.  Fact Sheet for Patients: https://www.moore.com/  Fact Sheet for Healthcare Providers: https://www.young.biz/  This test is not yet approved or cleared by the [place] FDA and  has been authorized for detection and/or diagnosis of SARS-CoV-2 by  FDA under an Emergency Use Authorization (EUA). This EUA will remain  in effect (meaning this test can be used) for the duration of the  Covid-19 declaration under Section 564(b)(1) of the Act, 21  U.S.C. section 360bbb-3(b)(1), unless the authorization is  terminated or revoked. Performed at [facility], [address], [place], [place] [address]   CBC     Status: Abnormal   Collection Time: 03/14/20  6:30 AM  Result Value Ref Range   WBC 12.1 (H) 4.0 - 10.5 K/uL   RBC 5.16 4.22 - 5.81 MIL/uL   Hemoglobin 15.6 13.0 - 17.0 g/dL   HCT 60.4 39 - 52 %   MCV 89.7 80.0 - 100.0 fL   MCH 30.2 26.0 - 34.0 pg   MCHC 33.7 30.0 - 36.0 g/dL   RDW 54.0 98.1 - 19.1 %   Platelets 337 150 - 400 K/uL   nRBC 0.0 0.0 - 0.2 %    Comment: Performed at [facility], [address] [place], [place] [address]  Comprehensive metabolic panel     Status: Abnormal   Collection Time: 03/14/20  6:30 AM  Result Value Ref Range   Sodium 143 135 - 145 mmol/L   Potassium 3.8 3.5 - 5.1 mmol/L   Chloride 107 98 - 111 mmol/L   CO2 26 22 - 32 mmol/L   Glucose, Bld 96 70 -  99 mg/dL    Comment: Glucose reference range applies only to samples taken after fasting for at least 8 hours.   BUN 13 6 - 20 mg/dL   Creatinine, Ser 5.62 0.61 - 1.24 mg/dL   Calcium 9.9 8.9 - 13.0 mg/dL   Total Protein 8.4 (H) 6.5 - 8.1 g/dL   Albumin 4.8 3.5 - 5.0 g/dL   AST 47 (H) 15 - 41 U/L   ALT 83 (H) 0 - 44 U/L   Alkaline Phosphatase 69 38 - 126 U/L   Total Bilirubin 0.8 0.3 - 1.2 mg/dL  GFR, Estimated >60 >60 mL/min    Comment: (NOTE) Calculated using the CKD-EPI Creatinine Equation (2021)    Anion gap 10 5 - 15    Comment: Performed at [facility], [address], [place], [place] [address]  Hemoglobin A1c     Status: None   Collection Time: 03/14/20  6:30 AM  Result Value Ref Range   Hgb A1c MFr Bld 5.5 4.8 - 5.6 %    Comment: (NOTE) Pre diabetes:          5.7%-6.4%  Diabetes:              >6.4%  Glycemic control for   <7.0% adults with diabetes    Mean Plasma Glucose 111.15 mg/dL    Comment: Performed at [facility], [address] [place], [place] [address]  Ethanol     Status: None   Collection Time: 03/14/20  6:30 AM  Result Value Ref Range   Alcohol, Ethyl (B) <10 <10 mg/dL    Comment: (NOTE) Lowest detectable limit for serum alcohol is 10 mg/dL.  For medical purposes only. Performed at [facility], [address], [place], [place] [address]   Lipid panel     Status: Abnormal   Collection Time: 03/14/20  6:30 AM  Result Value Ref Range   Cholesterol 180 0 - 200 mg/dL   Triglycerides 130 <865 mg/dL   HDL 33 (L) >78 mg/dL   Total CHOL/HDL Ratio 5.5 RATIO   VLDL 27 0 - 40 mg/dL   LDL Cholesterol 469 (H) 0 - 99 mg/dL    Comment:        Total Cholesterol/HDL:CHD Risk Coronary Heart Disease Risk Table                     Men   Women  1/2 Average Risk   3.4   3.3  Average Risk       5.0   4.4  2 X Average Risk   9.6   7.1  3 X Average Risk  23.4   11.0        Use the calculated Patient Ratio above  and the CHD Risk Table to determine the patient's CHD Risk.        ATP III CLASSIFICATION (LDL):  <100     mg/dL   Optimal  629-528  mg/dL   Near or Above                    Optimal  130-159  mg/dL   Borderline  413-244  mg/dL   High  >010     mg/dL   Very High Performed at [facility], [address], [place], [place] [address]   TSH     Status: Abnormal   Collection Time: 03/14/20  6:30 AM  Result Value Ref Range   TSH 5.616 (H) 0.350 - 4.500 uIU/mL    Comment: Performed by a 3rd Generation assay with a functional sensitivity of <=0.01 uIU/mL. Performed at [facility], [address] [place], [place] [address]   Urinalysis, Routine w reflex microscopic     Status: Abnormal   Collection Time: 03/14/20  6:50 AM  Result Value Ref Range   Color, Urine YELLOW YELLOW   APPearance CLEAR CLEAR   Specific Gravity, Urine 1.027 1.005 - 1.030   pH 5.0 5.0 - 8.0   Glucose, UA NEGATIVE NEGATIVE mg/dL   Hgb urine dipstick  NEGATIVE NEGATIVE   Bilirubin Urine NEGATIVE NEGATIVE   Ketones, ur NEGATIVE NEGATIVE mg/dL   Protein, ur 30 (A) NEGATIVE mg/dL   Nitrite NEGATIVE NEGATIVE   Leukocytes,Ua NEGATIVE NEGATIVE   RBC / HPF 0-5 0 - 5 RBC/hpf   WBC, UA 0-5 0 - 5 WBC/hpf   Bacteria, UA FEW (A) NONE SEEN   Squamous Epithelial / LPF 0-5 0 - 5   Mucus PRESENT     Comment: Performed at [facility], [address], [place], [place] [address]  Urine rapid drug screen (hosp performed)not at [facility]     Status: Abnormal   Collection Time: 03/14/20  6:50 AM  Result Value Ref Range   Opiates NONE DETECTED NONE DETECTED   Cocaine NONE DETECTED NONE DETECTED   Benzodiazepines NONE DETECTED NONE DETECTED   Amphetamines NONE DETECTED NONE DETECTED   Tetrahydrocannabinol POSITIVE (A) NONE DETECTED   Barbiturates NONE DETECTED NONE DETECTED    Comment: (NOTE) DRUG SCREEN FOR MEDICAL PURPOSES ONLY.  IF CONFIRMATION IS NEEDED FOR ANY PURPOSE,  NOTIFY LAB WITHIN 5 DAYS.  LOWEST DETECTABLE LIMITS FOR URINE DRUG SCREEN Drug Class                     Cutoff (ng/mL) Amphetamine and metabolites    1000 Barbiturate and metabolites    200 Benzodiazepine                 200 Tricyclics and metabolites     300 Opiates and metabolites        300 Cocaine and metabolites        300 THC                            50 Performed at [facility], [address], [place], [place] [address]     Blood Alcohol level:  Lab Results  Component Value Date   ETH <10 03/14/2020    Metabolic Disorder Labs:  Lab Results  Component Value Date   HGBA1C 5.5 03/14/2020   MPG 111.15 03/14/2020   MPG 114 08/27/2014   No results found for: PROLACTIN Lab Results  Component Value Date   CHOL 180 03/14/2020   TRIG 136 03/14/2020   HDL 33 (L) 03/14/2020   CHOLHDL 5.5 03/14/2020   VLDL 27 03/14/2020   LDLCALC 120 (H) 03/14/2020   LDLCALC 92 02/28/2019    Current Medications: Current Facility-Administered Medications  Medication Dose Route Frequency Provider Last Rate Last Admin  . acetaminophen (TYLENOL) tablet 650 mg  650 mg Oral Q6H PRN Aldean Baker, NP      . albuterol (VENTOLIN HFA) 108 (90 Base) MCG/ACT inhaler 1-2 puff  1-2 puff Inhalation Q6H PRN Aldean Baker, NP      . alum & mag hydroxide-simeth (MAALOX/MYLANTA) 200-200-20 MG/5ML suspension 30 mL  30 mL Oral Q4H PRN Aldean Baker, NP      . cholecalciferol (VITAMIN D3) tablet 400 Units  400 Units Oral Daily Aldean Baker, NP   400 Units at 03/14/20 0957  . escitalopram (LEXAPRO) tablet 10 mg  10 mg Oral Daily Jakiah Goree, Mardelle Matte, MD      . fluticasone [facility]) 50 MCG/ACT nasal spray 2 spray  2 spray Each Nare Daily Herby Abraham, RPH      . fluticasone furoate-vilanterol (BREO ELLIPTA) 200-25 MCG/INH 1 puff  1 puff Inhalation Daily Aldean Baker, NP      .  haloperidol lactate (HALDOL) injection 10 mg  10 mg Intramuscular BID PRN Aldean BakerSykes, Janet E, NP      .  hydrOXYzine (ATARAX/VISTARIL) tablet 25 mg  25 mg Oral TID PRN Aldean BakerSykes, Janet E, NP   25 mg at 03/14/20 0832  . loratadine (CLARITIN) tablet 10 mg  10 mg Oral Daily Aldean BakerSykes, Janet E, NP      . LORazepam (ATIVAN) tablet 1 mg  1 mg Oral BID PRN Aldean BakerSykes, Janet E, NP       Or  . LORazepam (ATIVAN) injection 1 mg  1 mg Intramuscular BID PRN Aldean BakerSykes, Janet E, NP      . magnesium hydroxide (MILK OF MAGNESIA) suspension 30 mL  30 mL Oral Daily PRN Aldean BakerSykes, Janet E, NP      . minocycline (MINOCIN) capsule 100 mg  100 mg Oral BID Aldean BakerSykes, Janet E, NP   100 mg at 03/14/20 1000  . OLANZapine (ZYPREXA) tablet 5 mg  5 mg Oral BID Lauro FranklinPashayan, Marah Park S, MD   5 mg at 03/14/20 1143  . OLANZapine zydis (ZYPREXA) disintegrating tablet 10 mg  10 mg Oral BID PRN Aldean BakerSykes, Janet E, NP      . traZODone (DESYREL) tablet 50 mg  50 mg Oral QHS PRN Aldean BakerSykes, Janet E, NP       PTA Medications: Medications Prior to Admission  Medication Sig Dispense Refill Last Dose  . albuterol (VENTOLIN HFA) 108 (90 Base) MCG/ACT inhaler INHALE 2 PUFFS INTO THE LUNGS EVERY 6 (SIX) HOURS AS NEEDED. FOR WHEEZING OR SHORTNESS OF BREATH (Patient taking differently: Inhale 2 puffs into the lungs every 6 (six) hours as needed for wheezing or shortness of breath. ) 18 g 1   . Budeson-Glycopyrrol-Formoterol (BREZTRI AEROSPHERE) 160-9-4.8 MCG/ACT AERO 2 inhalations 2 times per day (Patient taking differently: Inhale 2 puffs into the lungs 2 (two) times daily. ) 10.7 g 5   . cetirizine (ZYRTEC) 10 MG tablet Take 10 mg by mouth daily.      . Cholecalciferol (VITAMIN D PO) Take 2,000 Units by mouth daily.     . minocycline (MINOCIN) 100 MG capsule Take 100 mg by mouth 2 (two) times daily.       Musculoskeletal: Strength & Muscle Tone: within normal limits Gait & Station: normal Patient leans: N/A  Psychiatric Specialty Exam: Physical Exam Vitals and nursing note reviewed.  Constitutional:      General: He is not in acute distress.    Appearance: Normal  appearance. He is obese. He is not ill-appearing, toxic-appearing or diaphoretic.  HENT:     Head: Normocephalic and atraumatic.  Cardiovascular:     Rate and Rhythm: Tachycardia present.  Pulmonary:     Effort: Pulmonary effort is normal.  Abdominal:     Tenderness: There is no abdominal tenderness.  Musculoskeletal:        General: Normal range of motion.  Neurological:     General: No focal deficit present.     Mental Status: He is alert.     Review of Systems  Constitutional: Negative for fatigue and fever.  Respiratory: Negative for chest tightness and shortness of breath.   Cardiovascular: Negative for chest pain and palpitations.  Gastrointestinal: Negative for abdominal pain, constipation, diarrhea, nausea and vomiting.  Neurological: Negative for dizziness, light-headedness and headaches.  Psychiatric/Behavioral: Negative for agitation, hallucinations, sleep disturbance and suicidal ideas.    Blood pressure (!) 163/106, pulse (!) 116, temperature 97.6 F (36.4 C), temperature source Oral, resp. rate 18, height 6' (  1.829 m), weight (!) 147 kg, SpO2 100 %.Body mass index is 43.94 kg/m.  General Appearance: Disheveled  Eye Contact:  Poor  Speech:  Clear and Coherent and Pressured  Volume:  Normal  Mood:  Anxious  Affect:  Anxious  Thought Process:  Disorganized  Orientation:  Full (Time, Place, and Person)  Thought Content:  Mostly logical but occasionally delusional and rambling  Suicidal Thoughts:  No  Homicidal Thoughts:  No  Memory:  Immediate;   Fair Recent;   Fair  Judgement:  Fair  Insight:  Fair  Psychomotor Activity:  Increased and Restlessness  Concentration:  Concentration: Fair  Recall:  [employer] of Knowledge:  Fair  Language:  Good  Akathisia:  No  Handed:  Right  AIMS (if indicated):     Assets:  Desire for Improvement  ADL's:  Intact  Cognition:  WNL  Sleep:  Number of Hours: 4.75    Treatment Plan Summary: Daily contact with patient to  assess and evaluate symptoms and progress in treatment  Is more grounded in reality today. Reports high levels of anxiety. Is will to start medication and already has therapy scheduled with a counselor at his college. Encouraged keeping this appointment and continuing with therapy. Encouraged attending group therapy as able to develop and refine coping skills.  -Start Lexapro 10 mg daily -Start Zyprexa 5 mg BID   Observation Level/Precautions:  15 minute checks  Laboratory:  A1C: 5.5  TSH: 5.6 (high) CMP: AST/ALT 47/83  UDS: THC Lipid Panel: LDL 120 (high) HDL 33 (low)  Psychotherapy:    Medications:    Consultations:    Discharge Concerns:    Estimated LOS: 2-4 Days  Other:     Physician Treatment Plan for Primary Diagnosis: Psychoactive substance-induced psychosis (HCC) Long Term Goal(s): Improvement in symptoms so as ready for discharge  Short Term Goals: Ability to identify changes in lifestyle to reduce recurrence of condition will improve, Ability to identify and develop effective coping behaviors will improve, Compliance with prescribed medications will improve and Ability to identify triggers associated with substance abuse/mental health issues will improve  Physician Treatment Plan for Secondary Diagnosis: Principal Problem:   Psychoactive substance-induced psychosis (HCC) Active Problems:   Generalized anxiety disorder  Long Term Goal(s): Improvement in symptoms so as ready for discharge  Short Term Goals: Ability to identify changes in lifestyle to reduce recurrence of condition will improve, Ability to verbalize feelings will improve, Ability to demonstrate self-control will improve, Ability to identify and develop effective coping behaviors will improve, Compliance with prescribed medications will improve and Ability to identify triggers associated with substance abuse/mental health issues will improve  I certify that inpatient services furnished can reasonably be expected  to improve the patient's condition.    Lauro Franklin, MD 11/11/20211:00 PM
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
## 2020-03-14 NOTE — Progress Notes (Signed)
Recreation Therapy Notes  Date: 11.11.21 Time: 1000 Location: 500 Hall Dayroom  Group Topic: Coping Skills  Goal Area(s) Addresses:  Patient will identify positive coping skills. Patient will identify benefits of using positive coping skills.  Behavioral Response:  Engaged  Intervention: Worksheet  Activity: Orthoptist.  Patients were to identify the things they felt have kept them from progressing and write them inside the web.  Patients were to then identify positive coping skills that could help them break free from the things holding them back.   Education: Pharmacologist, Building control surveyor.   Education Outcome: Acknowledges understanding/In group clarification offered/Needs additional education.   Clinical Observations/Feedback: Pt was engaged in group session.  In discussion, pt made mention of dealing with mild depression.  Pt was able to list a few coping skills he has used like going to the gym, better time management, cutting out wrong choices, have better organization, tackle stress and anxiety better and acknowledge his mild depression.  Pt also agreed with peer on dealing with the victim mentality.  Pt expressed that was something he needed to work on as well.  Pt also stated he generally deals with the forgive and forget way of dealing with things.    Caroll Rancher, LRT/CTRS         Lillia Abed, Rondell Frick A 03/14/2020 11:04 AM

## 2020-03-14 NOTE — Progress Notes (Signed)
   03/14/20 0635  Vital Signs  Temp 97.6 F (36.4 C)  Temp Source Oral  Pulse Rate (!) 109  Pulse Rate Source Monitor  BP (!) 158/100  BP Location Right Arm  BP Method Automatic  Patient Position (if appropriate) Sitting  Oxygen Therapy  SpO2 100 %   D: Patient denies SI/HI. Patient admitted hearing voices and seeing "subliminal messages" on the tv. Pt. Reported hearing banging and voices were bothering him. Pt. Did not report what the voices were saying. Pt. Reported feeling paranoid and that we doesn't trust what he sees is real. Pt. Wanted to know if his name was outside the box on the the big white board chart in the hall. Patients name was outside of the box. Patient asked about words around the names, if they were really there. Patient rated anxiety 9/10 and depression 3/10. Pt out in open areas, went to group and was social with staff and peers. A:  Patient took scheduled medicine. Pt. Was given 25 mg of vistaril x2.for anxiety. Support and encouragement provided Routine safety checks conducted every 15 minutes. Patient  Informed to notify staff with any concerns.   R: Safety maintained.

## 2020-03-15 LAB — TSH: TSH: 2.519 u[IU]/mL (ref 0.350–4.500)

## 2020-03-15 MED ORDER — OLANZAPINE 5 MG PO TABS
5.0000 mg | ORAL_TABLET | Freq: Every day | ORAL | Status: DC
  Administered 2020-03-16 – 2020-03-17 (×2): 5 mg via ORAL
  Filled 2020-03-15 (×3): qty 1

## 2020-03-15 MED ORDER — OLANZAPINE 10 MG PO TABS
10.0000 mg | ORAL_TABLET | Freq: Every day | ORAL | Status: DC
  Administered 2020-03-15: 10 mg via ORAL
  Filled 2020-03-15 (×2): qty 1

## 2020-03-15 NOTE — Tx Team (Signed)
Interdisciplinary Treatment and Diagnostic Plan Update  03/15/2020 Time of Session: 9:35am Nathaniel [name] MRN: [id number]  Principal Diagnosis: Psychoactive substance-induced psychosis ([place])  Secondary Diagnoses: Principal Problem:   Psychoactive substance-induced psychosis ([place]) Active Problems:   Generalized anxiety disorder   Current Medications:  Current Facility-Administered Medications  Medication Dose Route Frequency Provider Last Rate Last Admin  . acetaminophen (TYLENOL) tablet 650 mg  650 mg Oral Q6H PRN Connye Burkitt, NP   650 mg at 03/14/20 2226  . albuterol (VENTOLIN HFA) 108 (90 Base) MCG/ACT inhaler 1-2 puff  1-2 puff Inhalation Q6H PRN Connye Burkitt, NP   2 puff at 03/14/20 2143  . alum & mag hydroxide-simeth (MAALOX/MYLANTA) 200-200-20 MG/5ML suspension 30 mL  30 mL Oral Q4H PRN Connye Burkitt, NP      . cholecalciferol (VITAMIN D3) tablet 400 Units  400 Units Oral Daily Connye Burkitt, NP   400 Units at 03/15/20 0955  . escitalopram (LEXAPRO) tablet 10 mg  10 mg Oral Daily Briant Cedar, MD   10 mg at 03/15/20 0802  . fluticasone (FLONASE) 50 MCG/ACT nasal spray 2 spray  2 spray Each Nare Daily Eudelia Bunch, RPH   2 spray at 03/15/20 0956  . fluticasone furoate-vilanterol (BREO ELLIPTA) 200-25 MCG/INH 1 puff  1 puff Inhalation Daily Connye Burkitt, NP   1 puff at 03/15/20 0956  . haloperidol lactate (HALDOL) injection 10 mg  10 mg Intramuscular BID PRN Connye Burkitt, NP      . hydrOXYzine (ATARAX/VISTARIL) tablet 25 mg  25 mg Oral TID PRN Connye Burkitt, NP   25 mg at 03/14/20 2305  . loratadine (CLARITIN) tablet 10 mg  10 mg Oral Daily Connye Burkitt, NP   10 mg at 03/15/20 0955  . LORazepam (ATIVAN) tablet 1 mg  1 mg Oral BID PRN Connye Burkitt, NP   1 mg at 03/14/20 2305   Or  . LORazepam (ATIVAN) injection 1 mg  1 mg Intramuscular BID PRN Connye Burkitt, NP      . magnesium hydroxide (MILK OF MAGNESIA) suspension 30 mL  30 mL Oral Daily PRN Connye Burkitt, NP      . minocycline (MINOCIN) capsule 100 mg  100 mg Oral BID Connye Burkitt, NP   100 mg at 03/15/20 0957  . OLANZapine (ZYPREXA) tablet 10 mg  10 mg Oral QHS Briant Cedar, MD      . Derrill Memo ON 03/16/2020] OLANZapine (ZYPREXA) tablet 5 mg  5 mg Oral Q0600 Briant Cedar, MD      . OLANZapine zydis (ZYPREXA) disintegrating tablet 10 mg  10 mg Oral BID PRN Connye Burkitt, NP      . traZODone (DESYREL) tablet 50 mg  50 mg Oral QHS PRN Connye Burkitt, NP   50 mg at 03/14/20 2117   PTA Medications: Medications Prior to Admission  Medication Sig Dispense Refill Last Dose  . albuterol (VENTOLIN HFA) 108 (90 Base) MCG/ACT inhaler INHALE 2 PUFFS INTO THE LUNGS EVERY 6 (SIX) HOURS AS NEEDED. FOR WHEEZING OR SHORTNESS OF BREATH (Patient taking differently: Inhale 2 puffs into the lungs every 6 (six) hours as needed for wheezing or shortness of breath. ) 18 g 1   . Budeson-Glycopyrrol-Formoterol (BREZTRI AEROSPHERE) 160-9-4.8 MCG/ACT AERO 2 inhalations 2 times per day (Patient taking differently: Inhale 2 puffs into the lungs 2 (two) times daily. ) 10.7 g 5   . cetirizine (ZYRTEC) 10 MG  tablet Take 10 mg by mouth daily.      . Cholecalciferol (VITAMIN D PO) Take 2,000 Units by mouth daily.     . minocycline (MINOCIN) 100 MG capsule Take 100 mg by mouth 2 (two) times daily.       Patient Stressors: Marital or family conflict Other: School Work  Patient Strengths: General fund of knowledge Supportive family/friends  Treatment Modalities: Medication Management, Group therapy, Case management,  1 to 1 session with clinician, Psychoeducation, Recreational therapy.   Physician Treatment Plan for Primary Diagnosis: Psychoactive substance-induced psychosis ([place]) Long Term Goal(s): Improvement in symptoms so as ready for discharge Improvement in symptoms so as ready for discharge   Short Term Goals: Ability to identify changes in lifestyle to reduce recurrence of condition will  improve Ability to identify and develop effective coping behaviors will improve Compliance with prescribed medications will improve Ability to identify triggers associated with substance abuse/mental health issues will improve Ability to identify changes in lifestyle to reduce recurrence of condition will improve Ability to verbalize feelings will improve Ability to demonstrate self-control will improve Ability to identify and develop effective coping behaviors will improve Compliance with prescribed medications will improve Ability to identify triggers associated with substance abuse/mental health issues will improve  Medication Management: Evaluate patient's response, side effects, and tolerance of medication regimen.  Therapeutic Interventions: 1 to 1 sessions, Unit Group sessions and Medication administration.  Evaluation of Outcomes: Progressing  Physician Treatment Plan for Secondary Diagnosis: Principal Problem:   Psychoactive substance-induced psychosis ([place]) Active Problems:   Generalized anxiety disorder  Long Term Goal(s): Improvement in symptoms so as ready for discharge Improvement in symptoms so as ready for discharge   Short Term Goals: Ability to identify changes in lifestyle to reduce recurrence of condition will improve Ability to identify and develop effective coping behaviors will improve Compliance with prescribed medications will improve Ability to identify triggers associated with substance abuse/mental health issues will improve Ability to identify changes in lifestyle to reduce recurrence of condition will improve Ability to verbalize feelings will improve Ability to demonstrate self-control will improve Ability to identify and develop effective coping behaviors will improve Compliance with prescribed medications will improve Ability to identify triggers associated with substance abuse/mental health issues will improve     Medication Management: Evaluate  patient's response, side effects, and tolerance of medication regimen.  Therapeutic Interventions: 1 to 1 sessions, Unit Group sessions and Medication administration.  Evaluation of Outcomes: Progressing   RN Treatment Plan for Primary Diagnosis: Psychoactive substance-induced psychosis ([place]) Long Term Goal(s): Knowledge of disease and therapeutic regimen to maintain health will improve  Short Term Goals: Ability to remain free from injury will improve, Ability to verbalize frustration and anger appropriately will improve, Ability to identify and develop effective coping behaviors will improve and Compliance with prescribed medications will improve  Medication Management: RN will administer medications as ordered by provider, will assess and evaluate patient's response and provide education to patient for prescribed medication. RN will report any adverse and/or side effects to prescribing provider.  Therapeutic Interventions: 1 on 1 counseling sessions, Psychoeducation, Medication administration, Evaluate responses to treatment, Monitor vital signs and CBGs as ordered, Perform/monitor CIWA, COWS, AIMS and Fall Risk screenings as ordered, Perform wound care treatments as ordered.  Evaluation of Outcomes: Not Met   LCSW Treatment Plan for Primary Diagnosis: Psychoactive substance-induced psychosis ([place]) Long Term Goal(s): Safe transition to appropriate next level of care at discharge, Engage patient in therapeutic group addressing interpersonal concerns.  Short Term Goals: Engage patient in aftercare planning with referrals and resources, Increase social support, Identify triggers associated with mental health/substance abuse issues and Increase skills for wellness and recovery  Therapeutic Interventions: Assess for all discharge needs, 1 to 1 time with Social worker, Explore available resources and support systems, Assess for adequacy in community support network, Educate family and  significant other(s) on suicide prevention, Complete Psychosocial Assessment, Interpersonal group therapy.  Evaluation of Outcomes: Not Met   Progress in Treatment: Attending groups: Yes. Participating in groups: Yes. Taking medication as prescribed: Yes. Toleration medication: Yes. Family/Significant other contact made: No, will contact:  mother Patient understands diagnosis: Yes. Discussing patient identified problems/goals with staff: Yes. Medical problems stabilized or resolved: Yes. Denies suicidal/homicidal ideation: Yes. Issues/concerns per patient self-inventory: No.  New problem(s) identified: No, Describe:  none  New Short Term/Long Term Goal(s): medication stabilization, elimination of SI thoughts, development of comprehensive mental wellness plan.   Patient Goals:  "To stick to my routine"  Discharge Plan or Barriers: Patient is to return home to live with mother/guardian. Patient is to receive therapy and medication management through [facility]  Reason for Continuation of Hospitalization: Anxiety Mania Medication stabilization  Estimated Length of Stay: 3-5 days  Attendees: Patient: Nathaniel [name] 03/15/2020   Physician: Melba Coon, MD 03/15/2020  Nursing:  03/15/2020   RN Care Manager: 03/15/2020  Social Worker: Darletta Moll, LCSW 03/15/2020   Recreational Therapist:  03/15/2020  Other:  03/15/2020   Other:  03/15/2020   Other: 03/15/2020       Scribe for Treatment Team: Vassie Moselle, LCSW 03/15/2020 10:37 AM

## 2020-03-15 NOTE — BHH Group Notes (Signed)
BHH LCSW Group Therapy  03/15/2020 12:51 PM  Type of Therapy and Topic: Group Therapy: Anger Management   Description of Group: In this group, patients will learn helpful strategies and techniques to manage anger, express anger in alternative ways, change hostile attitudes, and prevent aggressive acts, such as verbal abuse and violence.This group will be process-oriented and eductional, with patients participating in exploration of their own experiences as well as giving and receiving support and challenge from other group members.  Therapeutic Goals: 1. Patient will learn to manage anger. 2. Patient will learn to stop violence or the threat of violence. 3. Patient will learn to develop self control over thoughts and actions. 4. Patient will receive support and feedback from others  Therapeutic Modalities: Cognitive Behavioral Therapy Solution Focused Therapy Motivational Interviewing  Participation Level:  Active  Summary of Progress/Problems: Patient attended and participated in group. Patient shared that being disrespected is a trigger for anger for him.    Nathaniel [name] 03/15/2020, 12:51 PM

## 2020-03-15 NOTE — Progress Notes (Signed)
   03/15/20 2228  Psych Admission Type (Psych Patients Only)  Admission Status Voluntary  Psychosocial Assessment  Patient Complaints Anxiety  Eye Contact Brief  Facial Expression Anxious  Affect Anxious;Apprehensive  Speech Logical/coherent;Soft  Interaction Guarded;Forwards little;Evasive  Motor Activity Other (Comment) (Unremarkable)  Appearance/Hygiene Disheveled  Behavior Characteristics Cooperative  Mood Anxious;Depressed  Thought Process  Coherency WDL  Content Delusions;Preoccupation;Paranoia  Delusions Paranoid ("I don't think my mother is my mother and they are forcing me to do this")  Perception Hallucinations  Hallucination Auditory  Judgment Limited  Confusion Moderate  Danger to Self  Current suicidal ideation? Denies  Danger to Others  Danger to Others None reported or observed

## 2020-03-15 NOTE — Progress Notes (Signed)
   03/15/20 0000  Psych Admission Type (Psych Patients Only)  Admission Status Voluntary  Psychosocial Assessment  Patient Complaints Anxiety;Agitation  Eye Contact Brief  Facial Expression Anxious  Affect Anxious;Apprehensive  Speech Logical/coherent;Soft  Interaction Guarded;Forwards little;Evasive  Motor Activity Other (Comment) (Unremarkable)  Appearance/Hygiene Disheveled  Behavior Characteristics Cooperative  Mood Apprehensive;Anxious  Thought Process  Coherency WDL  Content Delusions;Preoccupation;Paranoia  Delusions Paranoid ("I don't think my mother is my mother and they are forcing me to do this")  Perception Hallucinations  Hallucination Auditory  Judgment Limited  Confusion Moderate  Danger to Self  Current suicidal ideation? Denies  Danger to Others  Danger to Others None reported or observed

## 2020-03-15 NOTE — Progress Notes (Signed)
Patient reported being anxious about unprotected sex that happened in the past and possibility if having HIV. Last HIV test was 04/20/2018 which was negative.  Will order HIV test for PM draw. Will discuss results with patient when they are obtained.    Lauro Franklin, MD 03/15/2020, 4:35 PM

## 2020-03-15 NOTE — B[facility] Group Notes (Signed)
[facility] LCSW Group Therapy  03/15/2020 2:22 PM  Type of Therapy:  Group Therapy  Participation Level:  Did Not Attend  Summary of Progress/Problems: Pt did not attend group.  Nathaniel [name] 03/15/2020, 2:22 PM

## 2020-03-15 NOTE — Progress Notes (Signed)
[facility] MD Progress Note  03/15/2020 7:40 AM Nathaniel [name]  MRN:  [id number] Subjective:   [name] is a 19 yr old male who presents with substance induced psychosis. He has no significant PPHx.   He reports having trouble falling asleep last night due to feeling congested and having anxiety. He reports eating well. He reports no SI/HI and no AVH. He is agreeable to increase medication. Encouraged participation in groups as able.  During interview patient still had poor eye contact and would shift back and forth on his feet. He did not have as many facial tick movements as he did yesterday. Nurse reported that he had auditory hallucinations last night.  Principal Problem: Psychoactive substance-induced psychosis (HCC) Diagnosis: Principal Problem:   Psychoactive substance-induced psychosis (HCC) Active Problems:   Generalized anxiety disorder  Total Time spent with patient: 15 minutes  Past Psychiatric History: None  Past Medical History:  Past Medical History:  Diagnosis Date  . Allergic rhinitis   . Chronic tonsillar hypertrophy    ENT did tonsillectomy 2020  . Elevated blood pressure reading without diagnosis of hypertension   . IFG (impaired fasting glucose) 12/2017   Gluc 107, HbA1c 5.5%  . Obesity, Class II, BMI 35-39.9   . OSA (obstructive sleep apnea) 02/2018   Sleep study recommended by Dr. Ivory Broad considering it.  . Severe persistent asthma    Dr. Sharyn Lull    Past Surgical History:  Procedure Laterality Date  . TONSILLECTOMY  06/03/2018   Family History:  Family History  Problem Relation Age of Onset  . Heart attack Other   . Prostate cancer Other   . Hypertension Father   . Hypertension Maternal Grandfather   . Cancer Maternal Grandfather   . Asthma Neg Hx    Family Psychiatric  History: Reports father's side of family has multiple psychiatric health issues but does not know what they are. Social History:  Social History   Substance and Sexual Activity   Alcohol Use No     Social History   Substance and Sexual Activity  Drug Use No    Social History   Socioeconomic History  . Marital status: Single    Spouse name: Not on file  . Number of children: Not on file  . Years of education: Not on file  . Highest education level: Not on file  Occupational History  . Not on file  Tobacco Use  . Smoking status: Never Smoker  . Smokeless tobacco: Never Used  Vaping Use  . Vaping Use: Never used  Substance and Sexual Activity  . Alcohol use: No  . Drug use: No  . Sexual activity: Not on file  Other Topics Concern  . Not on file  Social History Narrative   Single, lives with mom in [place].   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   No T/A/Ds.   Social Determinants of Health   Financial Resource Strain:   . Difficulty of Paying Living Expenses: Not on file  Food Insecurity:   . Worried About Programme researcher, broadcasting/film/video in the Last Year: Not on file  . Ran Out of Food in the Last Year: Not on file  Transportation Needs:   . Lack of Transportation (Medical): Not on file  . Lack of Transportation (Non-Medical): Not on file  Physical Activity:   . Days of Exercise per Week: Not on file  . Minutes of Exercise per Session: Not on file  Stress:   .  Feeling of Stress : Not on file  Social Connections:   . Frequency of Communication with Friends and Family: Not on file  . Frequency of Social Gatherings with Friends and Family: Not on file  . Attends Religious Services: Not on file  . Active Member of Clubs or Organizations: Not on file  . Attends Banker Meetings: Not on file  . Marital Status: Not on file   Additional Social History:    Pain Medications: denies Prescriptions: albuterol, Breztri aerosphere, zyrtec, vitamin D, Nasacort, minocycline History of alcohol / drug use?: Yes Name of Substance 1: thc 1 - Frequency: multiple times daily 1 - Duration: years, with break from use  about a year ago when pt was having mild sx of psychosis. Mother reports pt cleared with discontinued use of thc 1 - Last Use / Amount: friday, 03/08/20 Name of Substance 2: alcohol 2 - Amount (size/oz): binge 2 - Frequency: 1-2 x monthly 2 - Last Use / Amount: 2-3 days ago                Sleep: Fair  Appetite:  Fair  Current Medications: Current Facility-Administered Medications  Medication Dose Route Frequency Provider Last Rate Last Admin  . acetaminophen (TYLENOL) tablet 650 mg  650 mg Oral Q6H PRN Aldean Baker, NP   650 mg at 03/14/20 2226  . albuterol (VENTOLIN HFA) 108 (90 Base) MCG/ACT inhaler 1-2 puff  1-2 puff Inhalation Q6H PRN Aldean Baker, NP   2 puff at 03/14/20 2143  . alum & mag hydroxide-simeth (MAALOX/MYLANTA) 200-200-20 MG/5ML suspension 30 mL  30 mL Oral Q4H PRN Aldean Baker, NP      . cholecalciferol (VITAMIN D3) tablet 400 Units  400 Units Oral Daily Aldean Baker, NP   400 Units at 03/14/20 0957  . escitalopram (LEXAPRO) tablet 10 mg  10 mg Oral Daily Lauro Franklin, MD   10 mg at 03/14/20 1549  . fluticasone (FLONASE) 50 MCG/ACT nasal spray 2 spray  2 spray Each Nare Daily Herby Abraham, RPH      . fluticasone furoate-vilanterol (BREO ELLIPTA) 200-25 MCG/INH 1 puff  1 puff Inhalation Daily Marciano Sequin E, NP      . haloperidol lactate (HALDOL) injection 10 mg  10 mg Intramuscular BID PRN Aldean Baker, NP      . hydrOXYzine (ATARAX/VISTARIL) tablet 25 mg  25 mg Oral TID PRN Aldean Baker, NP   25 mg at 03/14/20 2305  . loratadine (CLARITIN) tablet 10 mg  10 mg Oral Daily Aldean Baker, NP      . LORazepam (ATIVAN) tablet 1 mg  1 mg Oral BID PRN Aldean Baker, NP   1 mg at 03/14/20 2305   Or  . LORazepam (ATIVAN) injection 1 mg  1 mg Intramuscular BID PRN Aldean Baker, NP      . magnesium hydroxide (MILK OF MAGNESIA) suspension 30 mL  30 mL Oral Daily PRN Aldean Baker, NP      . minocycline (MINOCIN) capsule 100 mg  100 mg Oral BID  Aldean Baker, NP   100 mg at 03/14/20 2117  . OLANZapine (ZYPREXA) tablet 5 mg  5 mg Oral BID Lauro Franklin, MD   5 mg at 03/14/20 2117  . OLANZapine zydis (ZYPREXA) disintegrating tablet 10 mg  10 mg Oral BID PRN Aldean Baker, NP      . traZODone (DESYREL) tablet 50 mg  50  mg Oral QHS PRN Aldean Baker, NP   50 mg at 03/14/20 2117    Lab Results:  Results for orders placed or performed during the hospital encounter of 03/13/20 (from the past 48 hour(s))  Respiratory Panel by RT PCR (Flu A&B, Covid) - Nasopharyngeal Swab     Status: None   Collection Time: 03/13/20  5:41 PM   Specimen: Nasopharyngeal Swab  Result Value Ref Range   SARS Coronavirus 2 by RT PCR NEGATIVE NEGATIVE    Comment: (NOTE) SARS-CoV-2 target nucleic acids are NOT DETECTED.  The SARS-CoV-2 RNA is generally detectable in upper respiratoy specimens during the acute phase of infection. The lowest concentration of SARS-CoV-2 viral copies this assay can detect is 131 copies/mL. A negative result does not preclude SARS-Cov-2 infection and should not be used as the sole basis for treatment or other patient management decisions. A negative result may occur with  improper specimen collection/handling, submission of specimen other than nasopharyngeal swab, presence of viral mutation(s) within the areas targeted by this assay, and inadequate number of viral copies (<131 copies/mL). A negative result must be combined with clinical observations, patient history, and epidemiological information. The expected result is Negative.  Fact Sheet for Patients:  https://www.moore.com/  Fact Sheet for Healthcare Providers:  https://www.young.biz/  This test is no t yet approved or cleared by the [place] FDA and  has been authorized for detection and/or diagnosis of SARS-CoV-2 by FDA under an Emergency Use Authorization (EUA). This EUA will remain  in effect (meaning this  test can be used) for the duration of the COVID-19 declaration under Section 564(b)(1) of the Act, 21 U.S.C. section 360bbb-3(b)(1), unless the authorization is terminated or revoked sooner.     Influenza A by PCR NEGATIVE NEGATIVE   Influenza B by PCR NEGATIVE NEGATIVE    Comment: (NOTE) The Xpert Xpress SARS-CoV-2/FLU/RSV assay is intended as an aid in  the diagnosis of influenza from Nasopharyngeal swab specimens and  should not be used as a sole basis for treatment. Nasal washings and  aspirates are unacceptable for Xpert Xpress SARS-CoV-2/FLU/RSV  testing.  Fact Sheet for Patients: https://www.moore.com/  Fact Sheet for Healthcare Providers: https://www.young.biz/  This test is not yet approved or cleared by the [place] FDA and  has been authorized for detection and/or diagnosis of SARS-CoV-2 by  FDA under an Emergency Use Authorization (EUA). This EUA will remain  in effect (meaning this test can be used) for the duration of the  Covid-19 declaration under Section 564(b)(1) of the Act, 21  U.S.C. section 360bbb-3(b)(1), unless the authorization is  terminated or revoked. Performed at [facility], [address] [place], [place] [address]   CBC     Status: Abnormal   Collection Time: 03/14/20  6:30 AM  Result Value Ref Range   WBC 12.1 (H) 4.0 - 10.5 K/uL   RBC 5.16 4.22 - 5.81 MIL/uL   Hemoglobin 15.6 13.0 - 17.0 g/dL   HCT 62.1 39 - 52 %   MCV 89.7 80.0 - 100.0 fL   MCH 30.2 26.0 - 34.0 pg   MCHC 33.7 30.0 - 36.0 g/dL   RDW 30.8 65.7 - 84.6 %   Platelets 337 150 - 400 K/uL   nRBC 0.0 0.0 - 0.2 %    Comment: Performed at [facility], [address], [place], [place] [address]  Comprehensive metabolic panel     Status: Abnormal   Collection Time: 03/14/20  6:30 AM  Result Value Ref Range   Sodium 143 135 - 145 mmol/L   Potassium 3.8 3.5 - 5.1 mmol/L   Chloride 107 98 - 111  mmol/L   CO2 26 22 - 32 mmol/L   Glucose, Bld 96 70 - 99 mg/dL    Comment: Glucose reference range applies only to samples taken after fasting for at least 8 hours.   BUN 13 6 - 20 mg/dL   Creatinine, Ser 4.94 0.61 - 1.24 mg/dL   Calcium 9.9 8.9 - 49.6 mg/dL   Total Protein 8.4 (H) 6.5 - 8.1 g/dL   Albumin 4.8 3.5 - 5.0 g/dL   AST 47 (H) 15 - 41 U/L   ALT 83 (H) 0 - 44 U/L   Alkaline Phosphatase 69 38 - 126 U/L   Total Bilirubin 0.8 0.3 - 1.2 mg/dL   GFR, Estimated >75 >91 mL/min    Comment: (NOTE) Calculated using the CKD-EPI Creatinine Equation (2021)    Anion gap 10 5 - 15    Comment: Performed at [facility], [address] [place], [place] [address]  Hemoglobin A1c     Status: None   Collection Time: 03/14/20  6:30 AM  Result Value Ref Range   Hgb A1c MFr Bld 5.5 4.8 - 5.6 %    Comment: (NOTE) Pre diabetes:          5.7%-6.4%  Diabetes:              >6.4%  Glycemic control for   <7.0% adults with diabetes    Mean Plasma Glucose 111.15 mg/dL    Comment: Performed at [facility], [address] [place], [place] [address]  Ethanol     Status: None   Collection Time: 03/14/20  6:30 AM  Result Value Ref Range   Alcohol, Ethyl (B) <10 <10 mg/dL    Comment: (NOTE) Lowest detectable limit for serum alcohol is 10 mg/dL.  For medical purposes only. Performed at [facility], [address] [place], [place] [address]   Lipid panel     Status: Abnormal   Collection Time: 03/14/20  6:30 AM  Result Value Ref Range   Cholesterol 180 0 - 200 mg/dL   Triglycerides 793 <903 mg/dL   HDL 33 (L) >00 mg/dL   Total CHOL/HDL Ratio 5.5 RATIO   VLDL 27 0 - 40 mg/dL   LDL Cholesterol 923 (H) 0 - 99 mg/dL    Comment:        Total Cholesterol/HDL:CHD Risk Coronary Heart Disease Risk Table                     Men   Women  1/2 Average Risk   3.4   3.3  Average Risk       5.0   4.4  2 X Average Risk   9.6   7.1  3 X Average Risk  23.4    11.0        Use the calculated Patient Ratio above and the CHD Risk Table to determine the patient's CHD Risk.        ATP III CLASSIFICATION (LDL):  <100     mg/dL   Optimal  300-762  mg/dL   Near or Above                    Optimal  130-159  mg/dL   Borderline  263-335  mg/dL   High  >456  mg/dL   Very High Performed at [facility], [address], [place], [place] [address]   TSH     Status: Abnormal   Collection Time: 03/14/20  6:30 AM  Result Value Ref Range   TSH 5.616 (H) 0.350 - 4.500 uIU/mL    Comment: Performed by a 3rd Generation assay with a functional sensitivity of <=0.01 uIU/mL. Performed at [facility], [address] [place], [place] [address]   Urinalysis, Routine w reflex microscopic     Status: Abnormal   Collection Time: 03/14/20  6:50 AM  Result Value Ref Range   Color, Urine YELLOW YELLOW   APPearance CLEAR CLEAR   Specific Gravity, Urine 1.027 1.005 - 1.030   pH 5.0 5.0 - 8.0   Glucose, UA NEGATIVE NEGATIVE mg/dL   Hgb urine dipstick NEGATIVE NEGATIVE   Bilirubin Urine NEGATIVE NEGATIVE   Ketones, ur NEGATIVE NEGATIVE mg/dL   Protein, ur 30 (A) NEGATIVE mg/dL   Nitrite NEGATIVE NEGATIVE   Leukocytes,Ua NEGATIVE NEGATIVE   RBC / HPF 0-5 0 - 5 RBC/hpf   WBC, UA 0-5 0 - 5 WBC/hpf   Bacteria, UA FEW (A) NONE SEEN   Squamous Epithelial / LPF 0-5 0 - 5   Mucus PRESENT     Comment: Performed at [facility], [address] [place], [place] [address]  Urine rapid drug screen (hosp performed)not at [facility]     Status: Abnormal   Collection Time: 03/14/20  6:50 AM  Result Value Ref Range   Opiates NONE DETECTED NONE DETECTED   Cocaine NONE DETECTED NONE DETECTED   Benzodiazepines NONE DETECTED NONE DETECTED   Amphetamines NONE DETECTED NONE DETECTED   Tetrahydrocannabinol POSITIVE (A) NONE DETECTED   Barbiturates NONE DETECTED NONE DETECTED    Comment: (NOTE) DRUG SCREEN FOR MEDICAL  PURPOSES ONLY.  IF CONFIRMATION IS NEEDED FOR ANY PURPOSE, NOTIFY LAB WITHIN 5 DAYS.  LOWEST DETECTABLE LIMITS FOR URINE DRUG SCREEN Drug Class                     Cutoff (ng/mL) Amphetamine and metabolites    1000 Barbiturate and metabolites    200 Benzodiazepine                 200 Tricyclics and metabolites     300 Opiates and metabolites        300 Cocaine and metabolites        300 THC                            50 Performed at [facility], [address] [place], [place] [address]   T4, free     Status: Abnormal   Collection Time: 03/14/20  6:02 PM  Result Value Ref Range   Free T4 1.30 (H) 0.61 - 1.12 ng/dL    Comment: (NOTE) Biotin ingestion may interfere with free T4 tests. If the results are inconsistent with the TSH level, previous test results, or the clinical presentation, then consider biotin interference. If needed, order repeat testing after stopping biotin. Performed at [facility], [address], [place], [place] [address]     Blood Alcohol level:  Lab Results  Component Value Date   [facility] <10 03/14/2020    Metabolic Disorder Labs: Lab Results  Component Value Date   HGBA1C 5.5 03/14/2020   MPG 111.15 03/14/2020   MPG 114 08/27/2014  No results found for: PROLACTIN Lab Results  Component Value Date   CHOL 180 03/14/2020   TRIG 136 03/14/2020   HDL 33 (L) 03/14/2020   CHOLHDL 5.5 03/14/2020   VLDL 27 03/14/2020   LDLCALC 120 (H) 03/14/2020   LDLCALC 92 02/28/2019    Physical Findings: AIMS: Facial and Oral Movements Muscles of Facial Expression: None, normal Lips and Perioral Area: None, normal Jaw: None, normal Tongue: None, normal,Extremity Movements Upper (arms, wrists, hands, fingers): None, normal Lower (legs, knees, ankles, toes): None, normal, Trunk Movements Neck, shoulders, hips: None, normal, Overall Severity Severity of abnormal movements (highest score from questions above): None,  normal Incapacitation due to abnormal movements: None, normal Patient's awareness of abnormal movements (rate only patient's report): No Awareness, Dental Status Current problems with teeth and/or dentures?: No Does patient usually wear dentures?: No  CIWA:  CIWA-Ar Total: 15 COWS:     Musculoskeletal: Strength & Muscle Tone: within normal limits Gait & Station: normal Patient leans: N/A  Psychiatric Specialty Exam: Physical Exam  Review of Systems  Blood pressure (!) 168/95, pulse (!) 105, temperature 97.6 F (36.4 C), temperature source Oral, resp. rate 18, height 6' (1.829 m), weight (!) 147 kg, SpO2 100 %.Body mass index is 43.94 kg/m.  General Appearance: Disheveled  Eye Contact:  Poor  Speech:  Clear and Coherent and Slow  Volume:  Normal  Mood:  Anxious  Affect:  Anxious  Thought Process:  Disorganized  Orientation:  Full (Time, Place, and Person)  Thought Content:  Logical  Suicidal Thoughts:  No  Homicidal Thoughts:  No  Memory:  Immediate;   Fair Recent;   Fair  Judgement:  Fair  Insight:  Shallow  Psychomotor Activity:  Normal  Concentration:  Concentration: Fair and Attention Span: Fair  Recall:  Fis[employer]Fair  Fund of Knowledge:  Fair  Language:  Good  Akathisia:  Negative  Handed:  Right  AIMS (if indicated):     Assets:  Communication Skills Desire for Improvement  ADL's:  Intact  Cognition:  WNL  Sleep:  Number of Hours: 5.5     Treatment Plan Summary: Daily contact with patient to assess and evaluate symptoms and progress in treatment  Reports continuing to have high levels of anxiety especially last night when attempting to sleep. Will increase PM dose of Zyprexa because he continues to have high levels of anxiety and reported auditory hallucinations. Encouraged continuing to attend groups as able. During interview he continues to have poor eye contact, shifting back and forth on his feet.  -Continue Lexapro 10 mg daily -Increase Zyprexa to 5 mg AM and  10 mg QHS   Lauro FranklinAlexander S Aulton Routt, MD 03/15/2020, 7:40 AM

## 2020-03-15 NOTE — Progress Notes (Signed)
   03/15/20 0000  Psych Admission Type (Psych Patients Only)  Admission Status Voluntary  Psychosocial Assessment  Patient Complaints Anxiety;Agitation  Eye Contact Brief  Facial Expression Anxious  Affect Anxious;Apprehensive  Speech Logical/coherent;Soft  Interaction Guarded;Forwards little;Evasive  Motor Activity Other (Comment) (Unremarkable)  Appearance/Hygiene Disheveled  Behavior Characteristics Cooperative  Mood Apprehensive;Anxious  Thought Process  Coherency WDL  Content Delusions;Preoccupation;Paranoia  Delusions Paranoid ("I don't think my mother is my mother and they are forcing me to do this")  Perception Hallucinations  Hallucination Auditory  Judgment Limited  Confusion Moderate  Danger to Self  Current suicidal ideation? Denies  Danger to Others  Danger to Others None reported or observed  D: Patient calm in dayroom but after medication given pt up to room pacing back and forth. Pt c/o not able to be still and working himself up to getting agitated. Pt did endorses AH and does appear paranoid.  A: Medications administered as prescribed. Support and encouragement provided as needed.  R: Patient remains safe on the unit. Will continue to monitor for safety and stability.

## 2020-03-16 LAB — T3, FREE: T3, Free: 4.5 pg/mL — ABNORMAL HIGH (ref 2.0–4.4)

## 2020-03-16 LAB — T4: T4, Total: 8.2 ug/dL (ref 4.5–12.0)

## 2020-03-16 LAB — HIV ANTIBODY (ROUTINE TESTING W REFLEX): HIV Screen 4th Generation wRfx: NONREACTIVE

## 2020-03-16 LAB — T4, FREE: Free T4: 1.16 ng/dL — ABNORMAL HIGH (ref 0.61–1.12)

## 2020-03-16 MED ORDER — OLANZAPINE 7.5 MG PO TABS
15.0000 mg | ORAL_TABLET | Freq: Every day | ORAL | Status: DC
  Administered 2020-03-16: 15 mg via ORAL
  Filled 2020-03-16 (×2): qty 2

## 2020-03-16 MED ORDER — METOPROLOL TARTRATE 25 MG PO TABS
25.0000 mg | ORAL_TABLET | Freq: Two times a day (BID) | ORAL | Status: DC
  Administered 2020-03-16 – 2020-03-18 (×4): 25 mg via ORAL
  Filled 2020-03-16 (×10): qty 1

## 2020-03-16 NOTE — Progress Notes (Signed)
Pt reports having increased anxiety, and AVH. He reports seeing his childhood home outside his window. He reports hearing voices that are not negative. He requested medication for the anxiety and hallucinations. Zyprexa 10 mg PO administered to the patient for psychosis.

## 2020-03-16 NOTE — B[facility] Group Notes (Signed)
  [facility] LCSW Group Therapy Note  Date/Time:  03/16/2020 11:15AM-12:00PM  Type of Therapy and Topic:  Group Therapy:  Avoiding Rehospitalization  Participation Level:  Active   Description of Group This process group involved patients discussing their plans related to taking care of themselves at discharge in order to avoid, if at all possible, being rehospitalized in the future.  The group brainstormed specific ways in which they will be able to implement these plans.    Therapeutic Goals Patient will identify and describe 2-3 specific tasks they plan to undertake in order to maximize their wellness and avoid coming back to a hospital. Patient will verbalize benefits of each task described, as well as barriers to implementation. Patients will brainstorm ways to implement their plans.  Summary of Patient Progress:  The patient expressed his plans for self-care at discharge include measure his words and emotions before letting them come out and said he can do this through learning better communication skills.  He was supportive of the ideas from other patients.  The way in which this will help is to keep him calm and happy.  He stated that one thing that triggers his happiness is music, but when it was mentioned that we will be having music therapy tomorrow, he became sad and said that he regrets that he has "done this to myself" so that he is not at home picking out his own music or when to listen to music..  The patient participated fully with an affect that was at times reactive and at other times blunted.  Therapeutic Modalities Stages of Change theory Motivational Interviewing    Ambrose Mantle, LCSW 03/16/2020, 9:31 AM

## 2020-03-16 NOTE — Progress Notes (Signed)
Patient attended group

## 2020-03-16 NOTE — BHH Group Notes (Signed)
.  Psychoeducational Group Note  Date: 03-15-20 Time: 0900-1000    Goal Setting   Purpose of Group: This group helps to provide patients with the steps of setting a goal that is specific, measurable, attainable, realistic and time specific. A discussion on how we keep ourselves stuck with negative self talk.    Participation Level:  Did not attend  PJudge, Delorse Limber

## 2020-03-16 NOTE — Progress Notes (Addendum)
[facility] MD Progress Note  03/16/2020 6:53 AM Nathaniel [name]  MRN:  [id number] Subjective:  [name] is a 19 yr old male who presents with substance induced psychosis. He has no significant PPHx.  He reports doing well today. He states that he had the best sleep he has had in years. He states his appetite is good. He reports no SI/HI and no AVH. He reports that his anxiety is improved he rates as 4 out of 10. He reports that he just now has the feeling of some pressure not about to have a panic attack. He reports being very home sick and has signed his 72 hour form.  Per nursing patient had auditory hallucinations early this morning and increasing levels of anxiety requesting PRN medications.  Principal Problem: Psychoactive substance-induced psychosis (HCC) Diagnosis: Principal Problem:   Psychoactive substance-induced psychosis (HCC) Active Problems:   Generalized anxiety disorder  Total Time spent with patient: 15 minutes  Past Psychiatric History: None  Past Medical History:  Past Medical History:  Diagnosis Date  . Allergic rhinitis   . Chronic tonsillar hypertrophy    ENT did tonsillectomy 2020  . Elevated blood pressure reading without diagnosis of hypertension   . IFG (impaired fasting glucose) 12/2017   Gluc 107, HbA1c 5.5%  . Obesity, Class II, BMI 35-39.9   . OSA (obstructive sleep apnea) 02/2018   Sleep study recommended by Dr. Ivory Broad considering it.  . Severe persistent asthma    Dr. Sharyn Lull    Past Surgical History:  Procedure Laterality Date  . TONSILLECTOMY  06/03/2018   Family History:  Family History  Problem Relation Age of Onset  . Heart attack Other   . Prostate cancer Other   . Hypertension Father   . Hypertension Maternal Grandfather   . Cancer Maternal Grandfather   . Asthma Neg Hx    Family Psychiatric  History: Reports father's side of family has multiple psychiatric health issues but does not know what they are. Social History:  Social History    Substance and Sexual Activity  Alcohol Use No     Social History   Substance and Sexual Activity  Drug Use No    Social History   Socioeconomic History  . Marital status: Single    Spouse name: Not on file  . Number of children: Not on file  . Years of education: Not on file  . Highest education level: Not on file  Occupational History  . Not on file  Tobacco Use  . Smoking status: Never Smoker  . Smokeless tobacco: Never Used  Vaping Use  . Vaping Use: Never used  Substance and Sexual Activity  . Alcohol use: No  . Drug use: No  . Sexual activity: Not on file  Other Topics Concern  . Not on file  Social History Narrative   Single, lives with mom in [place].   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   No T/A/Ds.   Social Determinants of Health   Financial Resource Strain:   . Difficulty of Paying Living Expenses: Not on file  Food Insecurity:   . Worried About Programme researcher, broadcasting/film/video in the Last Year: Not on file  . Ran Out of Food in the Last Year: Not on file  Transportation Needs:   . Lack of Transportation (Medical): Not on file  . Lack of Transportation (Non-Medical): Not on file  Physical Activity:   . Days of Exercise per Week: Not  on file  . Minutes of Exercise per Session: Not on file  Stress:   . Feeling of Stress : Not on file  Social Connections:   . Frequency of Communication with Friends and Family: Not on file  . Frequency of Social Gatherings with Friends and Family: Not on file  . Attends Religious Services: Not on file  . Active Member of Clubs or Organizations: Not on file  . Attends Banker Meetings: Not on file  . Marital Status: Not on file   Additional Social History:    Pain Medications: denies Prescriptions: albuterol, Breztri aerosphere, zyrtec, vitamin D, Nasacort, minocycline History of alcohol / drug use?: Yes Name of Substance 1: thc 1 - Frequency: multiple times daily 1 -  Duration: years, with break from use about a year ago when pt was having mild sx of psychosis. Mother reports pt cleared with discontinued use of thc 1 - Last Use / Amount: friday, 03/08/20 Name of Substance 2: alcohol 2 - Amount (size/oz): binge 2 - Frequency: 1-2 x monthly 2 - Last Use / Amount: 2-3 days ago                Sleep: Good  Appetite:  Good  Current Medications: Current Facility-Administered Medications  Medication Dose Route Frequency Provider Last Rate Last Admin  . acetaminophen (TYLENOL) tablet 650 mg  650 mg Oral Q6H PRN Aldean Baker, NP   650 mg at 03/14/20 2226  . albuterol (VENTOLIN HFA) 108 (90 Base) MCG/ACT inhaler 1-2 puff  1-2 puff Inhalation Q6H PRN Aldean Baker, NP   2 puff at 03/14/20 2143  . alum & mag hydroxide-simeth (MAALOX/MYLANTA) 200-200-20 MG/5ML suspension 30 mL  30 mL Oral Q4H PRN Aldean Baker, NP      . cholecalciferol (VITAMIN D3) tablet 400 Units  400 Units Oral Daily Aldean Baker, NP   400 Units at 03/15/20 0955  . escitalopram (LEXAPRO) tablet 10 mg  10 mg Oral Daily Lauro Franklin, MD   10 mg at 03/15/20 0802  . fluticasone (FLONASE) 50 MCG/ACT nasal spray 2 spray  2 spray Each Nare Daily Herby Abraham, RPH   2 spray at 03/15/20 0956  . fluticasone furoate-vilanterol (BREO ELLIPTA) 200-25 MCG/INH 1 puff  1 puff Inhalation Daily Aldean Baker, NP   1 puff at 03/15/20 0956  . haloperidol lactate (HALDOL) injection 10 mg  10 mg Intramuscular BID PRN Aldean Baker, NP      . hydrOXYzine (ATARAX/VISTARIL) tablet 25 mg  25 mg Oral TID PRN Aldean Baker, NP   25 mg at 03/15/20 1540  . loratadine (CLARITIN) tablet 10 mg  10 mg Oral Daily Aldean Baker, NP   10 mg at 03/15/20 0955  . LORazepam (ATIVAN) tablet 1 mg  1 mg Oral BID PRN Aldean Baker, NP   1 mg at 03/15/20 1827   Or  . LORazepam (ATIVAN) injection 1 mg  1 mg Intramuscular BID PRN Aldean Baker, NP      . magnesium hydroxide (MILK OF MAGNESIA) suspension 30 mL  30  mL Oral Daily PRN Aldean Baker, NP      . minocycline (MINOCIN) capsule 100 mg  100 mg Oral BID Aldean Baker, NP   100 mg at 03/15/20 2038  . OLANZapine (ZYPREXA) tablet 10 mg  10 mg Oral QHS Lauro Franklin, MD   10 mg at 03/15/20 2038  . OLANZapine (ZYPREXA) tablet 5  mg  5 mg Oral Q0600 Lauro Franklin, MD      . OLANZapine zydis [facility]) disintegrating tablet 10 mg  10 mg Oral BID PRN Aldean Baker, NP      . traZODone (DESYREL) tablet 50 mg  50 mg Oral QHS PRN Aldean Baker, NP   50 mg at 03/15/20 2039    Lab Results:  Results for orders placed or performed during the hospital encounter of 03/13/20 (from the past 48 hour(s))  T3, free     Status: Abnormal   Collection Time: 03/14/20  6:02 PM  Result Value Ref Range   T3, Free 4.5 (H) 2.0 - 4.4 pg/mL    Comment: (NOTE) Performed At: [facility] [address] [place], [place] [id number] Jolene Schimke MD JJ:[id number]   T4, free     Status: Abnormal   Collection Time: 03/14/20  6:02 PM  Result Value Ref Range   Free T4 1.30 (H) 0.61 - 1.12 ng/dL    Comment: (NOTE) Biotin ingestion may interfere with free T4 tests. If the results are inconsistent with the TSH level, previous test results, or the clinical presentation, then consider biotin interference. If needed, order repeat testing after stopping biotin. Performed at [facility], [address] [place], [place] [address]   TSH     Status: None   Collection Time: 03/15/20  5:57 PM  Result Value Ref Range   TSH 2.519 0.350 - 4.500 uIU/mL    Comment: Performed by a 3rd Generation assay with a functional sensitivity of <=0.01 uIU/mL. Performed at [facility], [address], [place], [place] [address]   T4, free     Status: Abnormal   Collection Time: 03/15/20  5:57 PM  Result Value Ref Range   Free T4 1.16 (H) 0.61 - 1.12 ng/dL    Comment: (NOTE) Biotin ingestion may interfere with free T4 tests. If the results  are inconsistent with the TSH level, previous test results, or the clinical presentation, then consider biotin interference. If needed, order repeat testing after stopping biotin. Performed at [facility], [address], [place], [place] [address]   T4     Status: None   Collection Time: 03/15/20  5:57 PM  Result Value Ref Range   T4, Total 8.2 4.5 - 12.0 ug/dL    Comment: (NOTE) Performed At: [facility] [address] [place], [place] [id number] Jolene Schimke MD BJ:[id number]     Blood Alcohol level:  Lab Results  Component Value Date   [facility] <10 03/14/2020    Metabolic Disorder Labs: Lab Results  Component Value Date   HGBA1C 5.5 03/14/2020   MPG 111.15 03/14/2020   MPG 114 08/27/2014   No results found for: PROLACTIN Lab Results  Component Value Date   CHOL 180 03/14/2020   TRIG 136 03/14/2020   HDL 33 (L) 03/14/2020   CHOLHDL 5.5 03/14/2020   VLDL 27 03/14/2020   LDLCALC 120 (H) 03/14/2020   LDLCALC 92 02/28/2019    Physical Findings: AIMS: Facial and Oral Movements Muscles of Facial Expression: None, normal Lips and Perioral Area: None, normal Jaw: None, normal Tongue: None, normal,Extremity Movements Upper (arms, wrists, hands, fingers): None, normal Lower (legs, knees, ankles, toes): None, normal, Trunk Movements Neck, shoulders, hips: None, normal, Overall Severity Severity of abnormal movements (highest score from questions above): None, normal Incapacitation due to abnormal movements: None, normal Patient's awareness of abnormal movements (rate only patient's report): No Awareness, Dental  Status Current problems with teeth and/or dentures?: No Does patient usually wear dentures?: No  CIWA:  CIWA-Ar Total: 15 COWS:     Musculoskeletal: Strength & Muscle Tone: within normal limits Gait & Station: normal Patient leans: N/A  Psychiatric Specialty Exam: Physical Exam Vitals and nursing note reviewed.  Constitutional:       Appearance: Normal appearance. He is obese.  HENT:     Head: Normocephalic and atraumatic.  Cardiovascular:     Rate and Rhythm: Tachycardia present.  Pulmonary:     Effort: Pulmonary effort is normal.  Abdominal:     Tenderness: There is no abdominal tenderness.  Musculoskeletal:        General: Normal range of motion.  Neurological:     General: No focal deficit present.     Mental Status: He is alert.     Review of Systems  Constitutional: Negative for fatigue and fever.  Respiratory: Negative for chest tightness and shortness of breath.   Cardiovascular: Negative for chest pain and palpitations.  Gastrointestinal: Negative for abdominal pain, constipation, diarrhea, nausea and vomiting.  Neurological: Negative for dizziness, light-headedness and headaches.  Psychiatric/Behavioral: Negative for agitation, hallucinations, self-injury, sleep disturbance and suicidal ideas. The patient is nervous/anxious.     Blood pressure (!) 165/103, pulse (!) 127, temperature 97.8 F (36.6 C), temperature source Oral, resp. rate 20, height 6' (1.829 m), weight (!) 147 kg, SpO2 100 %.Body mass index is 43.94 kg/m.  General Appearance: Disheveled  Eye Contact:  Poor  Speech:  Clear and Coherent and Pressured  Volume:  Decreased  Mood:  Anxious  Affect:  Anxious  Thought Process:  Disorganized  Orientation:  Full (Time, Place, and Person)  Thought Content:  Paranoid Ideation  Suicidal Thoughts:  No  Homicidal Thoughts:  No  Memory:  Immediate;   Fair Recent;   Fair  Judgement:  Impaired  Insight:  Lacking  Psychomotor Activity:  Normal  Concentration:  Concentration: Poor and Attention Span: Poor  Recall:  Fair  Fund of Knowledge:  Good  Language:  Good  Akathisia:  No  Handed:  Right  AIMS (if indicated):     Assets:  Communication Skills Desire for Improvement  ADL's:  Intact  Cognition:  WNL  Sleep:  Number of Hours: 6.25     Treatment Plan Summary: Daily contact with  patient to assess and evaluate symptoms and progress in treatment  Patient reports improving anxiety but he is continuing to have auditory hallucinations and high levels of anxiety. Will need to continue to monitor. He has signed his 72 hour letter if still having hallucinations may need to consider IVC. Encouraged attending group sessions.  -Continue Lexapro 10 mg daily -Will increase Zyprexa to 5 mg AM and 15 mg QHS   BP: Blood pressure has been consistently elevated during his admission. Will start Lopressor due to elevated BP, pulse, and anxiety. -Start Lopressor 25 mg BID   His Thyroid lab work has been inconsistent. Originally had elevated TSH with elevated T4 but on repeat TSH is within normal limits. Due to this will arrange Endocrine follow up for further workup.   Lauro FranklinAlexander S Alyssah Algeo, MD 03/16/2020, 6:53 AM

## 2020-03-16 NOTE — Progress Notes (Signed)
   03/16/20 1000  Psych Admission Type (Psych Patients Only)  Admission Status Voluntary  Psychosocial Assessment  Patient Complaints None  Eye Contact Fair  Facial Expression Anxious  Affect Anxious;Labile;Preoccupied  Speech Logical/coherent  Interaction Guarded  Appearance/Hygiene Disheveled  Behavior Characteristics Cooperative;Anxious  Mood Labile;Pleasant  Thought Process  Coherency Concrete thinking  Content WDL  Delusions None reported or observed  Perception WDL  Hallucination None reported or observed  Judgment Limited  Confusion Mild  Danger to Self  Current suicidal ideation? Denies  Danger to Others  Danger to Others None reported or observed

## 2020-03-16 NOTE — [facility] Suicide Risk Assessment (Addendum)
[facility] INPATIENT:  Family/Significant Other Suicide Prevention Education   Suicide Prevention Education: Education Completed; Mother, [name] [phone]), has been identified by the patient as the family member/significant other with whom the patient will be residing, and identified as the person(s) who will aid the patient in the event of a mental health crisis (suicidal ideations/suicide attempt).  With written consent from the patient, the family member/significant other has been provided the following suicide prevention education, prior to the and/or following the discharge of the patient.  The suicide prevention education provided includes the following:  Suicide risk factors  Suicide prevention and interventions  National Suicide Hotline telephone number  [facility] assessment telephone number  [facility] Emergency Assistance 911  [facility] and/or Residential Mobile Crisis Unit telephone number   Request made of family/significant other to:  Remove weapons (e.g., guns, rifles, knives), all items previously/currently identified as safety concern.    Remove drugs/medications (over-the-counter, prescriptions, illicit drugs), all items previously/currently identified as a safety concern.   The family member/significant other verbalizes understanding of the suicide prevention education information provided.  The family member/significant other agrees to remove the items of safety concern listed above.   CSW spoke to this patients mother. Per mother this patient has been smoking THC on daily basis and she feels that this is what has led to him becoming symptomatic. Per patients mother she is not his legal guardian, however she is aware he has been calling her this. Patients mother stated he is able to return home at discharge and she has no safety concerns with him returning to the community.   Ruthann Cancer MSW, LCSW Clincal Social Worker  [facility]

## 2020-03-17 MED ORDER — OLANZAPINE 5 MG PO TBDP: 5.0000 mg | ORAL_TABLET | Freq: Every day | ORAL | Status: DC | PRN

## 2020-03-17 MED ORDER — OLANZAPINE 10 MG PO TABS
30.0000 mg | ORAL_TABLET | Freq: Every day | ORAL | Status: DC
  Administered 2020-03-17: 30 mg via ORAL
  Filled 2020-03-17 (×3): qty 3

## 2020-03-17 NOTE — Progress Notes (Signed)
   03/17/20 0800  Psych Admission Type (Psych Patients Only)  Admission Status Voluntary  Psychosocial Assessment  Patient Complaints Anxiety  Eye Contact Fair  Facial Expression Anxious  Affect Preoccupied  Speech Logical/coherent  Interaction Guarded  Motor Activity Slow  Appearance/Hygiene Disheveled  Behavior Characteristics Appropriate to situation  Mood Anxious  Thought Process  Coherency Concrete thinking  Content WDL  Delusions None reported or observed  Perception WDL  Hallucination None reported or observed  Judgment Limited  Confusion Mild  Danger to Self  Current suicidal ideation? Denies  Danger to Others  Danger to Others None reported or observed

## 2020-03-17 NOTE — [facility] Group Notes (Signed)
[facility] LCSW Group Therapy Note  Date/Time:  03/17/2020  11:00AM-12:00PM  Type of Therapy and Topic:  Group Therapy:  Music and Mood  Participation Level:  Active   Description of Group: In this process group, members listened to a variety of genres of music and identified that different types of music evoke different responses.  Patients were encouraged to identify music that was soothing for them and music that was energizing for them.  Patients discussed how this knowledge can help with wellness and recovery in various ways including managing depression and anxiety as well as encouraging healthy sleep habits.    Therapeutic Goals: Patients will explore the impact of different varieties of music on mood Patients will verbalize the thoughts they have when listening to different types of music Patients will identify music that is soothing to them as well as music that is energizing to them Patients will discuss how to use this knowledge to assist in maintaining wellness and recovery Patients will explore the use of music as a coping skill  Summary of Patient Progress:  At the beginning of group, patient expressed that he felt great, but a little tired.  He listened attentively and said many of the songs were hopeful.  At the end of group, patient expressed that he felt optimistic.    Therapeutic Modalities: Solution Focused Brief Therapy Activity   Ambrose Mantle, LCSW

## 2020-03-17 NOTE — Progress Notes (Signed)
Patient has been up in the dayroom briefly, reports having had a good day. At bedtime he c/o nasal stuffiness and requested his inhaler which he received. Room temperature was checked and advised to use his pillows to elevate his head. Safety maintained with 15 min checks.

## 2020-03-17 NOTE — Progress Notes (Signed)
[facility] MD Progress Note  03/17/2020 1:32 PM Nathaniel [name]  MRN:  [id number] Subjective:  Patient reports that his mood is "really good" today. Per nursing he reported that this is one of his best days. Patient reports that his sleep was good after he received Ativan and appetite is good. He is anxious to discharge soon and is already thinking about his plans for returning to school and rejoining organizations next semester based on his class load. Patient reports that he has learned a lot from his time here and going to group therapies. Principal Problem: Psychoactive substance-induced psychosis (HCC) Diagnosis: Principal Problem:   Psychoactive substance-induced psychosis (HCC) Active Problems:   Generalized anxiety disorder  Total Time spent with patient: 15 minutes  Past Psychiatric History: See H&P  Past Medical History:  Past Medical History:  Diagnosis Date  . Allergic rhinitis   . Chronic tonsillar hypertrophy    ENT did tonsillectomy 2020  . Elevated blood pressure reading without diagnosis of hypertension   . IFG (impaired fasting glucose) 12/2017   Gluc 107, HbA1c 5.5%  . Obesity, Class II, BMI 35-39.9   . OSA (obstructive sleep apnea) 02/2018   Sleep study recommended by Dr. Ivory Broad considering it.  . Severe persistent asthma    Dr. Sharyn Lull    Past Surgical History:  Procedure Laterality Date  . TONSILLECTOMY  06/03/2018   Family History:  Family History  Problem Relation Age of Onset  . Heart attack Other   . Prostate cancer Other   . Hypertension Father   . Hypertension Maternal Grandfather   . Cancer Maternal Grandfather   . Asthma Neg Hx    Family Psychiatric  History: See H&P Social History:  Social History   Substance and Sexual Activity  Alcohol Use No     Social History   Substance and Sexual Activity  Drug Use No    Social History   Socioeconomic History  . Marital status: Single    Spouse name: Not on file  . Number of children: Not on file   . Years of education: Not on file  . Highest education level: Not on file  Occupational History  . Not on file  Tobacco Use  . Smoking status: Never Smoker  . Smokeless tobacco: Never Used  Vaping Use  . Vaping Use: Never used  Substance and Sexual Activity  . Alcohol use: No  . Drug use: No  . Sexual activity: Not on file  Other Topics Concern  . Not on file  Social History Narrative   Single, lives with mom in [place].   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   No T/A/Ds.   Social Determinants of Health   Financial Resource Strain:   . Difficulty of Paying Living Expenses: Not on file  Food Insecurity:   . Worried About Programme researcher, broadcasting/film/video in the Last Year: Not on file  . Ran Out of Food in the Last Year: Not on file  Transportation Needs:   . Lack of Transportation (Medical): Not on file  . Lack of Transportation (Non-Medical): Not on file  Physical Activity:   . Days of Exercise per Week: Not on file  . Minutes of Exercise per Session: Not on file  Stress:   . Feeling of Stress : Not on file  Social Connections:   . Frequency of Communication with Friends and Family: Not on file  . Frequency of Social Gatherings with Friends and Family:  Not on file  . Attends Religious Services: Not on file  . Active Member of Clubs or Organizations: Not on file  . Attends Banker Meetings: Not on file  . Marital Status: Not on file   Additional Social History:    Pain Medications: denies Prescriptions: albuterol, Breztri aerosphere, zyrtec, vitamin D, Nasacort, minocycline History of alcohol / drug use?: Yes Name of Substance 1: thc 1 - Frequency: multiple times daily 1 - Duration: years, with break from use about a year ago when pt was having mild sx of psychosis. Mother reports pt cleared with discontinued use of thc 1 - Last Use / Amount: friday, 03/08/20 Name of Substance 2: alcohol 2 - Amount (size/oz): binge 2 -  Frequency: 1-2 x monthly 2 - Last Use / Amount: 2-3 days ago                Sleep: Good after receiving Ativan  Appetite:  Good  Current Medications: Current Facility-Administered Medications  Medication Dose Route Frequency Provider Last Rate Last Admin  . acetaminophen (TYLENOL) tablet 650 mg  650 mg Oral Q6H PRN Aldean Baker, NP   650 mg at 03/14/20 2226  . albuterol (VENTOLIN HFA) 108 (90 Base) MCG/ACT inhaler 1-2 puff  1-2 puff Inhalation Q6H PRN Aldean Baker, NP   2 puff at 03/14/20 2143  . alum & mag hydroxide-simeth (MAALOX/MYLANTA) 200-200-20 MG/5ML suspension 30 mL  30 mL Oral Q4H PRN Aldean Baker, NP      . cholecalciferol (VITAMIN D3) tablet 400 Units  400 Units Oral Daily Aldean Baker, NP   400 Units at 03/17/20 0936  . escitalopram (LEXAPRO) tablet 10 mg  10 mg Oral Daily Lauro Franklin, MD   10 mg at 03/17/20 0752  . fluticasone (FLONASE) 50 MCG/ACT nasal spray 2 spray  2 spray Each Nare Daily Herby Abraham, RPH   2 spray at 03/17/20 0936  . fluticasone furoate-vilanterol (BREO ELLIPTA) 200-25 MCG/INH 1 puff  1 puff Inhalation Daily Aldean Baker, NP   1 puff at 03/17/20 0936  . haloperidol lactate (HALDOL) injection 10 mg  10 mg Intramuscular BID PRN Aldean Baker, NP      . hydrOXYzine (ATARAX/VISTARIL) tablet 25 mg  25 mg Oral TID PRN Aldean Baker, NP   25 mg at 03/16/20 2151  . loratadine (CLARITIN) tablet 10 mg  10 mg Oral Daily Aldean Baker, NP   10 mg at 03/17/20 0936  . LORazepam (ATIVAN) tablet 1 mg  1 mg Oral BID PRN Aldean Baker, NP   1 mg at 03/16/20 2305   Or  . LORazepam (ATIVAN) injection 1 mg  1 mg Intramuscular BID PRN Aldean Baker, NP      . magnesium hydroxide (MILK OF MAGNESIA) suspension 30 mL  30 mL Oral Daily PRN Aldean Baker, NP      . metoprolol tartrate (LOPRESSOR) tablet 25 mg  25 mg Oral BID Lauro Franklin, MD   25 mg at 03/17/20 0752  . minocycline (MINOCIN) capsule 100 mg  100 mg Oral BID Aldean Baker,  NP   100 mg at 03/17/20 0936  . OLANZapine (ZYPREXA) tablet 30 mg  30 mg Oral QHS Mariel Craft, MD      . OLANZapine zydis [facility]) disintegrating tablet 5 mg  5 mg Oral Daily PRN Mariel Craft, MD      . traZODone (DESYREL) tablet 50 mg  50  mg Oral QHS PRN Aldean BakerSykes, Janet E, NP   50 mg at 03/16/20 2151    Lab Results:  Results for orders placed or performed during the hospital encounter of 03/13/20 (from the past 48 hour(s))  HIV Antibody (routine testing w rflx)     Status: None   Collection Time: 03/15/20  5:57 PM  Result Value Ref Range   HIV Screen 4th Generation wRfx Non Reactive Non Reactive    Comment: Performed at [facility], [address] [place], [place] [address]  TSH     Status: None   Collection Time: 03/15/20  5:57 PM  Result Value Ref Range   TSH 2.519 0.350 - 4.500 uIU/mL    Comment: Performed by a 3rd Generation assay with a functional sensitivity of <=0.01 uIU/mL. Performed at [facility], [address] [place], [place] [address]   T4, free     Status: Abnormal   Collection Time: 03/15/20  5:57 PM  Result Value Ref Range   Free T4 1.16 (H) 0.61 - 1.12 ng/dL    Comment: (NOTE) Biotin ingestion may interfere with free T4 tests. If the results are inconsistent with the TSH level, previous test results, or the clinical presentation, then consider biotin interference. If needed, order repeat testing after stopping biotin. Performed at [facility], [address] [place], [place] [address]   T4     Status: None   Collection Time: 03/15/20  5:57 PM  Result Value Ref Range   T4, Total 8.2 4.5 - 12.0 ug/dL    Comment: (NOTE) Performed At: [facility] [address] [place], [place] [id number] Jolene SchimkeNagendra Sanjai MD OZ:[id number]:[phone]     Blood Alcohol level:  Lab Results  Component Value Date   [facility] <10 03/14/2020    Metabolic Disorder Labs: Lab Results  Component Value Date   HGBA1C 5.5 03/14/2020   MPG 111.15  03/14/2020   MPG 114 08/27/2014   No results found for: PROLACTIN Lab Results  Component Value Date   CHOL 180 03/14/2020   TRIG 136 03/14/2020   HDL 33 (L) 03/14/2020   CHOLHDL 5.5 03/14/2020   VLDL 27 03/14/2020   LDLCALC 120 (H) 03/14/2020   LDLCALC 92 02/28/2019    Physical Findings: AIMS: Facial and Oral Movements Muscles of Facial Expression: None, normal Lips and Perioral Area: None, normal Jaw: None, normal Tongue: None, normal,Extremity Movements Upper (arms, wrists, hands, fingers): None, normal Lower (legs, knees, ankles, toes): None, normal, Trunk Movements Neck, shoulders, hips: None, normal, Overall Severity Severity of abnormal movements (highest score from questions above): None, normal Incapacitation due to abnormal movements: None, normal Patient's awareness of abnormal movements (rate only patient's report): No Awareness, Dental Status Current problems with teeth and/or dentures?: No Does patient usually wear dentures?: No  CIWA:  CIWA-Ar Total: 15 COWS:     Musculoskeletal: Strength & Muscle Tone: within normal limits Gait & Station: normal Patient leans: N/A  Psychiatric Specialty Exam: Physical Exam Constitutional:      Appearance: Normal appearance.  HENT:     Head: Normocephalic and atraumatic.  Pulmonary:     Effort: Pulmonary effort is normal.  Neurological:     Mental Status: He is alert.     Review of Systems  Constitutional: Negative for appetite change.  Respiratory: Negative for shortness of breath.   Psychiatric/Behavioral: Negative for hallucinations.    Blood pressure (!) 120/105, pulse 97, temperature 97.7 F (36.5 C), temperature source Oral, resp. rate  20, height 6' (1.829 m), weight (!) 147 kg, SpO2 100 %.Body mass index is 43.94 kg/m.  General Appearance: Fairly Groomed  Eye Contact:  Good  Speech:  Clear and Coherent  Volume:  Normal  Mood:  "really good today"  Affect:  Congruent  Thought Process:  Goal Directed   Orientation:  NA  Thought Content:  Logical  Suicidal Thoughts:  No  Homicidal Thoughts:  No  Memory:  NA  Judgement:  Good  Insight:  Good  Psychomotor Activity:  Normal  Concentration:  Concentration: Good and Attention Span: Good  Recall:  NA  Fund of Knowledge:  Good  Language:  Good  Akathisia:  No  Handed:  Right  AIMS (if indicated):     Assets:  Communication Skills Desire for Improvement Housing Physical Health Resilience  ADL's:  Intact  Cognition:  WNL  Sleep:  Number of Hours: 7.5     Treatment Plan Summary: Daily contact with patient to assess and evaluate symptoms and progress in treatment. Patient reports feeling better today and does not endorse AVH. He did require 1mg  Ativan overnight and 10mg  Zyprexa PRN yesterday afternoon. Patient appears to have responded well to these PRN's, his Zyprexa will be increased. Patient is nearing discharge, need to ensure that patient remains stable on new dose of Zyprexa 30mg .  - Continue Lexapro 10mg  daily - Zyprexa 30mg  QHS - Trazodone 50mg  QHS  BP: Blood pressure has been consistently elevated during his admission. Will start Lopressor due to elevated BP, pulse, and anxiety. BP 155/81 today. Suggest patient f/u OP w/ PCP. - Lopressor 25 mg BID   His Thyroid lab work has been inconsistent. Originally had elevated TSH with elevated T4 but on repeat TSH is within normal limits. Due to this will arrange Endocrine follow up for further workup.    , MD 03/17/2020, 1:32 PM

## 2020-03-17 NOTE — Progress Notes (Signed)
   03/17/20 2135  COVID-19 Daily Checkoff  Have you had a fever (temp > 37.80C/100F)  in the past 24 hours?  No  If you have had runny nose, nasal congestion, sneezing in the past 24 hours, has it worsened? No  COVID-19 EXPOSURE  Have you traveled outside the state in the past 14 days? No  Have you been in contact with someone with a confirmed diagnosis of COVID-19 or PUI in the past 14 days without wearing appropriate PPE? No  Have you been living in the same home as a person with confirmed diagnosis of COVID-19 or a PUI (household contact)? No  Have you been diagnosed with COVID-19? No

## 2020-03-17 NOTE — Progress Notes (Signed)
   03/17/20 0800  Psych Admission Type (Psych Patients Only)  Admission Status Voluntary  Psychosocial Assessment  Patient Complaints Anxiety  Eye Contact Fair  Facial Expression Anxious  Affect Preoccupied  Speech Logical/coherent  Interaction Guarded  Motor Activity Slow  Appearance/Hygiene Disheveled  Behavior Characteristics Appropriate to situation  Mood Anxious  Thought Process  Coherency Concrete thinking  Content WDL  Delusions None reported or observed  Perception WDL  Hallucination None reported or observed  Judgment Limited  Confusion Mild  Danger to Self  Current suicidal ideation? Denies  Danger to Others  Danger to Others None reported or observed   

## 2020-03-17 NOTE — Progress Notes (Signed)
Patient c/o nasal stuffiness and trouble sleeping. He did report that he took 2 naps during the day shift. Patient  Received he scheduled medications earlier but later came back reporting he still could not rest. He was given an ativan before returning to his room in hopes to get some more rest.

## 2020-03-18 MED ORDER — ESCITALOPRAM OXALATE 10 MG PO TABS: 10.0000 mg | ORAL_TABLET | Freq: Every day | ORAL | 0 refills | Status: DC

## 2020-03-18 MED ORDER — METOPROLOL TARTRATE 25 MG PO TABS: 25.0000 mg | ORAL_TABLET | Freq: Two times a day (BID) | ORAL | 0 refills | Status: DC

## 2020-03-18 MED ORDER — MINOCYCLINE HCL 100 MG PO CAPS: 100.0000 mg | ORAL_CAPSULE | Freq: Two times a day (BID) | ORAL | 0 refills | Status: AC

## 2020-03-18 MED ORDER — OLANZAPINE 15 MG PO TABS: 30.0000 mg | ORAL_TABLET | Freq: Every day | ORAL | 0 refills | Status: DC

## 2020-03-18 NOTE — BHH Suicide Risk Assessment (Signed)
[facility] Discharge Suicide Risk Assessment   Principal Problem: Psychoactive substance-induced psychosis (HCC) Discharge Diagnoses: Principal Problem:   Psychoactive substance-induced psychosis (HCC) Active Problems:   Generalized anxiety disorder   Total Time spent with patient: 20 minutes  Musculoskeletal: Strength & Muscle Tone: within normal limits Gait & Station: normal Patient leans: N/A  Psychiatric Specialty Exam: Review of Systems  All other systems reviewed and are negative.   Blood pressure (!) 137/91, pulse 92, temperature 97.8 F (36.6 C), temperature source Oral, resp. rate 20, height 6' (1.829 m), weight (!) 147 kg, SpO2 100 %.Body mass index is 43.94 kg/m.  General Appearance: Casual  Eye Contact::  Fair  Speech:  Normal Rate409  Volume:  Normal  Mood:  Euthymic  Affect:  Congruent  Thought Process:  Coherent and Descriptions of Associations: Intact  Orientation:  Full (Time, Place, and Person)  Thought Content:  Hallucinations: Auditory  Suicidal Thoughts:  No  Homicidal Thoughts:  No  Memory:  Immediate;   Good Recent;   Good Remote;   Good  Judgement:  Intact  Insight:  Fair  Psychomotor Activity:  Normal  Concentration:  Fair  Recall:  [employer] of Knowledge:Fair  Language: Good  Akathisia:  Negative  Handed:  Right  AIMS (if indicated):     Assets:  Desire for Improvement Housing Resilience Social Support  Sleep:  Number of Hours: 7.5  Cognition: WNL  ADL's:  Intact   Mental Status Per Nursing Assessment::   On Admission:  NA  Demographic Factors:  Male, Caucasian and Low socioeconomic status  Loss Factors: NA  Historical Factors: Impulsivity  Risk Reduction Factors:   Living with another person, especially a relative and Positive social support  Continued Clinical Symptoms:  Alcohol/Substance Abuse/Dependencies  Cognitive Features That Contribute To Risk:  None    Suicide Risk:  Minimal: No identifiable suicidal ideation.   Patients presenting with no risk factors but with morbid ruminations; may be classified as minimal risk based on the severity of the depressive symptoms   Follow-up Information    [facility] Follow up on 03/19/2020.   Why: You have a hospital follow up and screening appointment for therapy services on 03/19/20 at 2:00 pm.  This will be a Virtual appointment held via Zoom. Contact information: [facility] [address] [place], [place] [address]  Phone: [phone], (#3 - [place]) Fax:  [phone]       [facility] Follow up.   Specialty: Behavioral Health Why: a referral has been placed for medication management services Contact information: [address] [place] [address] [phone]              Plan Of Care/Follow-up recommendations:  Activity:  ad lib  Antonieta Pert, MD 03/18/2020, 10:03 AM

## 2020-03-18 NOTE — Plan of Care (Signed)
Pt was able to focus on activity for duration of one of the groups at completion of recreation therapy group sessions.    Caroll Rancher, LRT/CTRS

## 2020-03-18 NOTE — Progress Notes (Signed)
  [facility] Adult Case Management Discharge Plan :  Will you be returning to the same living situation after discharge:  Yes,  to home At discharge, do you have transportation home?: Yes,  parent to pick this patient up Do you have the ability to pay for your medications: Yes,  has insurance   Release of information consent forms completed and in the chart;  Patient's signature needed at discharge.  Patient to Follow up at:  Follow-up Information    [facility] Follow up on 03/19/2020.   Why: You have a hospital follow up and screening appointment for therapy services on 03/19/20 at 2:00 pm.  This will be a Virtual appointment held via Zoom. Contact information: [facility] [address] [place], [place] [address]  Phone: [phone], (#3 - [place]) Fax:  [phone]       [facility] Follow up.   Specialty: Behavioral Health Why: a referral has been placed for medication management services Contact information: [address] [place] [address] [phone]              Next level of care provider has access to [facility] Link:no  Safety Planning and Suicide Prevention discussed: Yes,  with mother  Have you used any form of tobacco in the last 30 days? (Cigarettes, Smokeless Tobacco, Cigars, and/or Pipes): No  Has patient been referred to the Quitline?: N/A patient is not a smoker  Patient has been referred for addiction treatment: Pt. refused referral  Otelia Santee, LCSW 03/18/2020, 10:27 AM

## 2020-03-18 NOTE — Progress Notes (Signed)
Pt discharged at 12:30pm left with his mother, his ride. Pt is alert and oriented to person, place, time and situation. Pt is calm, cooperative, pleasant. Pt denies suicidal and homicidal ideation, denies hallucinations, denies feelings of anxiety and depression at time, of discharge. Pt was given discharge instructions, which include pt's follow up appointments, discharge medication prescriptions sent to pt's pharmacy electronically, discharge medication education; pt verbalized understanding of all. Upon discharge pt voices no complaints, no distress noted, none reported, all personal belongings returned to pt upon discharge.

## 2020-03-18 NOTE — Discharge Summary (Signed)
Physician Discharge Summary Note  Patient:  Nathaniel [name] is an 19 y.o., male MRN:  [id number] DOB:  2000-07-21 Patient phone:  [phone] (home)  Patient address:   [address] [place] [address],  Total Time spent with patient: 15 minutes  Date of Admission:  03/13/2020 Date of Discharge: 03/18/2020  Reason for Admission:  Substance-Induced Psychosis  Principal Problem: Psychoactive substance-induced psychosis [facility]) Discharge Diagnoses: Principal Problem:   Psychoactive substance-induced psychosis (HCC) Active Problems:   Generalized anxiety disorder   Past Psychiatric History: None  Past Medical History:  Past Medical History:  Diagnosis Date  . Allergic rhinitis   . Chronic tonsillar hypertrophy    ENT did tonsillectomy 2020  . Elevated blood pressure reading without diagnosis of hypertension   . IFG (impaired fasting glucose) 12/2017   Gluc 107, HbA1c 5.5%  . Obesity, Class II, BMI 35-39.9   . OSA (obstructive sleep apnea) 02/2018   Sleep study recommended by Dr. Ivory Broad considering it.  . Severe persistent asthma    Dr. Sharyn Lull    Past Surgical History:  Procedure Laterality Date  . TONSILLECTOMY  06/03/2018   Family History:  Family History  Problem Relation Age of Onset  . Heart attack Other   . Prostate cancer Other   . Hypertension Father   . Hypertension Maternal Grandfather   . Cancer Maternal Grandfather   . Asthma Neg Hx    Family Psychiatric  History: Reports father's side of family has multiple psychiatric health issues but does not know what they are. Social History:  Social History   Substance and Sexual Activity  Alcohol Use No     Social History   Substance and Sexual Activity  Drug Use No    Social History   Socioeconomic History  . Marital status: Single    Spouse name: Not on file  . Number of children: Not on file  . Years of education: Not on file  . Highest education level: Not on file  Occupational  History  . Not on file  Tobacco Use  . Smoking status: Never Smoker  . Smokeless tobacco: Never Used  Vaping Use  . Vaping Use: Never used  Substance and Sexual Activity  . Alcohol use: No  . Drug use: No  . Sexual activity: Not on file  Other Topics Concern  . Not on file  Social History Narrative   Single, lives with mom in [place].   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   No T/A/Ds.   Social Determinants of Health   Financial Resource Strain:   . Difficulty of Paying Living Expenses: Not on file  Food Insecurity:   . Worried About Programme researcher, broadcasting/film/video in the Last Year: Not on file  . Ran Out of Food in the Last Year: Not on file  Transportation Needs:   . Lack of Transportation (Medical): Not on file  . Lack of Transportation (Non-Medical): Not on file  Physical Activity:   . Days of Exercise per Week: Not on file  . Minutes of Exercise per Session: Not on file  Stress:   . Feeling of Stress : Not on file  Social Connections:   . Frequency of Communication with Friends and Family: Not on file  . Frequency of Social Gatherings with Friends and Family: Not on file  . Attends Religious Services: Not on file  . Active Member of Clubs or Organizations: Not on file  . Attends Club  or Organization Meetings: Not on file  . Marital Status: Not on file    Hospital Course:  Patient presented to PCP with mother on 11/10. Displayed delusions and strange behavior was recommended to go to [facility]. While there he became agitated, paranoid, and verbally aggressive and he was admitted. He reported having auditory hallucinations. Patient had a similar episode a year prior when using THC. He had stopped using THC but had started using again.  He was started on Zyprexa and was titrated up. Due to his high levels of anxiety he was also started on Lexapro. Due to consistent high blood pressure during admission, tachycardia, and anxiety patient was started on  Metoprolol. He responded well to medications with no signs of negative side effects. He was discharged home with his mother. On day of discharge he reported no SI and no HI. He did reports some auditory hallucinations continuing to persist but stated he knew they were not real, on admission auditory hallucinations where worse.  Physical Findings: AIMS: Facial and Oral Movements Muscles of Facial Expression: None, normal Lips and Perioral Area: None, normal Jaw: None, normal Tongue: None, normal,Extremity Movements Upper (arms, wrists, hands, fingers): None, normal Lower (legs, knees, ankles, toes): None, normal, Trunk Movements Neck, shoulders, hips: None, normal, Overall Severity Severity of abnormal movements (highest score from questions above): None, normal Incapacitation due to abnormal movements: None, normal Patient's awareness of abnormal movements (rate only patient's report): No Awareness, Dental Status Current problems with teeth and/or dentures?: No Does patient usually wear dentures?: No  CIWA:  CIWA-Ar Total: 15 COWS:     Musculoskeletal: Strength & Muscle Tone: within normal limits Gait & Station: normal Patient leans: N/A  Psychiatric Specialty Exam: Physical Exam Vitals and nursing note reviewed.  Constitutional:      General: He is not in acute distress.    Appearance: Normal appearance. He is obese. He is not ill-appearing, toxic-appearing or diaphoretic.  HENT:     Head: Normocephalic and atraumatic.  Cardiovascular:     Rate and Rhythm: Normal rate.  Pulmonary:     Effort: Pulmonary effort is normal.  Abdominal:     Tenderness: There is no abdominal tenderness.  Musculoskeletal:        General: Normal range of motion.  Neurological:     General: No focal deficit present.     Mental Status: He is alert.     Review of Systems  Constitutional: Negative for fatigue and fever.  Respiratory: Negative for chest tightness and shortness of breath.    Cardiovascular: Negative for chest pain and palpitations.  Gastrointestinal: Negative for abdominal pain, constipation, diarrhea, nausea and vomiting.  Neurological: Negative for dizziness, light-headedness and headaches.  Psychiatric/Behavioral: Negative for agitation, behavioral problems, self-injury, sleep disturbance and suicidal ideas. The patient is nervous/anxious.     Blood pressure (!) 137/91, pulse 92, temperature 97.8 F (36.6 C), temperature source Oral, resp. rate 20, height 6' (1.829 m), weight (!) 147 kg, SpO2 100 %.Body mass index is 43.94 kg/m.  General Appearance: Fairly Groomed  Eye Contact:  Good  Speech:  Clear and Coherent and Normal Rate  Volume:  Normal  Mood:  Anxious but mild  Affect:  Appropriate  Thought Process:  Coherent  Orientation:  Full (Time, Place, and Person)  Thought Content:  Logical with mild delusions about who the president is  Suicidal Thoughts:  No  Homicidal Thoughts:  No  Memory:  Immediate;   Good Recent;   Good  Judgement:  Fair  Insight:  Fair  Psychomotor Activity:  Normal  Concentration:  Concentration: Good and Attention Span: Good  Recall:  Good  Fund of Knowledge:  Good  Language:  Good  Akathisia:  No  Handed:  Right  AIMS (if indicated):     Assets:  Desire for Improvement Housing Social Support  ADL's:  Intact  Cognition:  WNL  Sleep:  Number of Hours: 7.5     Have you used any form of tobacco in the last 30 days? (Cigarettes, Smokeless Tobacco, Cigars, and/or Pipes): No  Has this patient used any form of tobacco in the last 30 days? (Cigarettes, Smokeless Tobacco, Cigars, and/or Pipes) No  Blood Alcohol level:  Lab Results  Component Value Date   ETH <10 03/14/2020    Metabolic Disorder Labs:  Lab Results  Component Value Date   HGBA1C 5.5 03/14/2020   MPG 111.15 03/14/2020   MPG 114 08/27/2014   No results found for: PROLACTIN Lab Results  Component Value Date   CHOL 180 03/14/2020   TRIG 136  03/14/2020   HDL 33 (L) 03/14/2020   CHOLHDL 5.5 03/14/2020   VLDL 27 03/14/2020   LDLCALC 120 (H) 03/14/2020   LDLCALC 92 02/28/2019    See Psychiatric Specialty Exam and Suicide Risk Assessment completed by Attending Physician prior to discharge.  Discharge destination:  Home  Is patient on multiple antipsychotic therapies at discharge:  No   Has Patient had three or more failed trials of antipsychotic monotherapy by history:  No  Recommended Plan for Multiple Antipsychotic Therapies: NA  Discharge Instructions    Call MD for:  difficulty breathing, headache or visual disturbances   Complete by: As directed    Call MD for:  extreme fatigue   Complete by: As directed    Call MD for:  hives   Complete by: As directed    Call MD for:  persistant dizziness or light-headedness   Complete by: As directed    Call MD for:  persistant nausea and vomiting   Complete by: As directed    Call MD for:  redness, tenderness, or signs of infection (pain, swelling, redness, odor or green/yellow discharge around incision site)   Complete by: As directed    Call MD for:  severe uncontrolled pain   Complete by: As directed    Call MD for:  temperature >100.4   Complete by: As directed    Diet - low sodium heart healthy   Complete by: As directed    Increase activity slowly   Complete by: As directed      Allergies as of 03/18/2020      Reactions   Amoxicillin Shortness Of Breath, Swelling      Medication List    TAKE these medications     Indication  albuterol 108 (90 Base) MCG/ACT inhaler Commonly known as: Ventolin HFA INHALE 2 PUFFS INTO THE LUNGS EVERY 6 (SIX) HOURS AS NEEDED. FOR WHEEZING OR SHORTNESS OF BREATH What changed:   how much to take  how to take this  when to take this  reasons to take this  additional instructions  Indication: Asthma   Breztri Aerosphere 160-9-4.8 MCG/ACT Aero Generic drug: Budeson-Glycopyrrol-Formoterol 2 inhalations 2 times per  day What changed:   how much to take  how to take this  when to take this  additional instructions  Indication: Chronic Obstructive Lung Disease   cetirizine 10 MG tablet Commonly known as: ZYRTEC Take 10 mg by mouth daily.  Indication: Hayfever   escitalopram 10 MG tablet Commonly known as: LEXAPRO Take 1 tablet (10 mg total) by mouth daily. Start taking on: March 19, 2020  Indication: Generalized Anxiety Disorder   metoprolol tartrate 25 MG tablet Commonly known as: LOPRESSOR Take 1 tablet (25 mg total) by mouth 2 (two) times daily.  Indication: High Blood Pressure Disorder   minocycline 100 MG capsule Commonly known as: MINOCIN Take 1 capsule (100 mg total) by mouth 2 (two) times daily for 5 days.  Indication: Common Acne   OLANZapine 15 MG tablet Commonly known as: ZYPREXA Take 2 tablets (30 mg total) by mouth at bedtime.  Indication: Acute Psychosis   VITAMIN D PO Take 2,000 Units by mouth daily.  Indication: Deficiency       Follow-up Information    [facility]   Why: You have a hospital follow up and screening appointment for therapy services on 03/19/20 at 2:00 pm.  This will be a Virtual appointment held via Zoom. Contact information: [facility] [address] [place], [place] [address]  Phone: [phone], (#3 - [place]) Fax:  [phone]       [facility] Follow up.   [facility] Contact information: [address] [place] [address] [phone]               Follow-up recommendations: - Activity as tolerated. - Diet as recommended by PCP. - Keep all scheduled follow-up appointments as recommended.    Comments:  Please make all follow up appointments  Signed: Lauro FranklinAlexander S Clare Casto, MD 03/18/2020, 10:42 AM

## 2020-03-18 NOTE — Progress Notes (Signed)
Recreation Therapy Notes  INPATIENT RECREATION TR PLAN  Patient Details Name: Airik Goodlin MRN: [id number] DOB: July 28, 2000 Today's Date: 03/18/2020  Rec Therapy Plan Is patient appropriate for Therapeutic Recreation?: Yes Treatment times per week: about 3 days Estimated Length of Stay: 5-7 days TR Treatment/Interventions: Group participation (Comment)  Discharge Criteria Pt will be discharged from therapy if:: Discharged Treatment plan/goals/alternatives discussed and agreed upon by:: Patient/family  Discharge Summary Short term goals set: See patient care plan Short term goals met: Adequate for discharge Progress toward goals comments: Groups attended Which groups?: Communication, Coping skills Reason goals not met: Pt unable to focus in one group session. Therapeutic equipment acquired: N/A Reason patient discharged from therapy: Discharge from hospital Pt/family agrees with progress & goals achieved: Yes Date patient discharged from therapy: 03/18/20    Victorino Sparrow, LRT/CTRS  Ria Comment, Hudsyn Champine A 03/18/2020, 12:32 PM

## 2020-03-18 NOTE — Progress Notes (Signed)
Recreation Therapy Notes  Date: 11.15.21 Time: 1000 Location: 500 Hall Dayroom  Group Topic: Communication  Goal Area(s) Addresses:  Patient will effectively communicate with peers in group.  Patient will verbalize benefit of healthy communication. Patient will verbalize positive effect of healthy communication on post d/c goals.  Patient will identify communication techniques that made activity effective for group.   Behavioral Response: Minimal  Intervention: Geometrical shapes, pencils, blank paper  Activity: Geometrical Drawings.  One patient would come to the front of the group and describe a geometrical shapes drawing as detailed as possible.  The remaining group members would draw the picture how it is described to them.  The group members could only as the presenter to repeat themselves, they could not ask any detailed questions.  The process would continue until all 3 pictures have been presented.  Education: Communication, Discharge Planning  Education Outcome: Acknowledges understanding/In group clarification offered/Needs additional education.   Clinical Observations/Feedback:  Pt appeared drowsy and seemed to become a little frustrated with the activity.  Pt left early and did not return.    Caroll Rancher, LRT/CTRS     Caroll Rancher A 03/18/2020 11:52 AM

## 2020-03-18 NOTE — [facility] Group Notes (Signed)
[facility] Group Notes:  (Nursing/MHT/Case Management/Adjunct)  Date:  03/18/2020  Time:  9:55 AM  Type of Therapy:  Group Therapy  Participation Level:  Active  Participation Quality:  Appropriate  Affect:  Appropriate  Cognitive:  Alert and Appropriate  Insight:  Appropriate and Good  Engagement in Group:  Engaged  Modes of Intervention:  Orientation  Summary of Progress/Problems: His goal for today is to continue his growth mentally and build better relationships with family and close friends.   Skii Cleland J Daiveon Markman 03/18/2020, 9:55 AM

## 2020-04-11 ENCOUNTER — Telehealth (HOSPITAL_COMMUNITY): Payer: No Typology Code available for payment source | Admitting: Physician Assistant

## 2020-06-26 ENCOUNTER — Ambulatory Visit (INDEPENDENT_AMBULATORY_CARE_PROVIDER_SITE_OTHER): Payer: No Typology Code available for payment source | Admitting: Neurology

## 2020-06-26 ENCOUNTER — Encounter: Payer: Self-pay | Admitting: Neurology

## 2020-06-26 VITALS — BP 152/94 | HR 87 | Ht 73.0 in | Wt 317.3 lb

## 2020-06-26 DIAGNOSIS — R0683 Snoring: Secondary | ICD-10-CM | POA: Diagnosis not present

## 2020-06-26 DIAGNOSIS — R635 Abnormal weight gain: Secondary | ICD-10-CM

## 2020-06-26 DIAGNOSIS — R519 Headache, unspecified: Secondary | ICD-10-CM

## 2020-06-26 DIAGNOSIS — Z820 Family history of epilepsy and other diseases of the nervous system: Secondary | ICD-10-CM

## 2020-06-26 DIAGNOSIS — Z9189 Other specified personal risk factors, not elsewhere classified: Secondary | ICD-10-CM

## 2020-06-26 DIAGNOSIS — R4589 Other symptoms and signs involving emotional state: Secondary | ICD-10-CM

## 2020-06-26 NOTE — Patient Instructions (Addendum)
It was good to see you again today.  As discussed, if you continue to feel depressed, please talk to your primary care physician about medical management and perhaps a referral to psychiatry for specialty evaluation. Please also let Dr. Donzetta Kohut know that you are off the Zyprexa.  When you were hospitalized in November, they did recommend outpatient follow-up with behavioral health and made a referral in that regard to psychiatry.  Based on your symptoms and your exam I believe you are at risk for obstructive sleep apnea (aka OSA), and I think we should proceed with a sleep study to determine whether you do or do not have OSA and how severe it is. Even, if you have mild OSA, I may want you to consider treatment with CPAP, as treatment of even borderline or mild sleep apnea can result and improvement of symptoms such as sleep disruption, daytime sleepiness, nighttime bathroom breaks, restless leg symptoms, improvement of headache syndromes, even improved mood disorder.   As explained, an attended sleep study meaning you get to stay overnight in the sleep lab, lets [place] monitor sleep-related behaviors such as sleep talking and leg movements in sleep, in addition to monitoring for sleep apnea.  A home sleep test is a screening tool for sleep apnea only, and unfortunately does not help with any other sleep-related diagnoses.  Please remember, the long-term risks and ramifications of untreated moderate to severe obstructive sleep apnea are: increased Cardiovascular disease, including congestive heart failure, stroke, difficult to control hypertension, treatment resistant obesity, arrhythmias, especially irregular heartbeat commonly known as A. Fib. (atrial fibrillation); even type 2 diabetes has been linked to untreated OSA.   Sleep apnea can cause disruption of sleep and sleep deprivation in most cases, which, in turn, can cause recurrent headaches, problems with memory, mood, concentration, focus, and  vigilance. Most people with untreated sleep apnea report excessive daytime sleepiness, which can affect their ability to drive. Please do not drive if you feel sleepy. Patients with sleep apnea can also develop difficulty initiating and maintaining sleep (aka insomnia).   Having sleep apnea may increase your risk for other sleep disorders, including involuntary behaviors sleep such as sleep terrors, sleep talking, sleepwalking.    Having sleep apnea can also increase your risk for restless leg syndrome and leg movements at night.   Please note that untreated obstructive sleep apnea may carry additional perioperative morbidity. Patients with significant obstructive sleep apnea (typically, in the moderate to severe degree) should receive, if possible, perioperative PAP (positive airway pressure) therapy and the surgeons and particularly the anesthesiologists should be informed of the diagnosis and the severity of the sleep disordered breathing.   I will likely see you back after your sleep study to go over the test results and where to go from there. We will call you after your sleep study to advise about the results (most likely, you will hear from [facility], my nurse) and to set up an appointment at the time, as necessary.    Our sleep lab administrative assistant will call you to schedule your sleep study and give you further instructions, regarding the check in process for the sleep study, arrival time, what to bring, when you can expect to leave after the study, etc., and to answer any other logistical questions you may have. If you don't hear back from her by about 2 weeks from now, please feel free to call her direct line at [phone] or you can call our general clinic number, or email  [place] through My Chart.

## 2020-06-26 NOTE — Progress Notes (Signed)
Subjective:    Nathaniel [name] ID: Nathaniel Nathaniel [name] is a 20 y.o. male.  HPI     Interim history:   Dear Dr. Donzetta Nathaniel [name],   I saw your Nathaniel [name], Nathaniel Nathaniel [name], upon your kind request in my sleep clinic today for reconsultation of his sleep disturbance, concern for underlying obstructive sleep apnea.  Nathaniel [name] is unaccompanied today. As you know, Nathaniel Nathaniel [name] is a 20 year old right-handed gentleman with an underlying medical history of allergic rhinitis, impaired fasting glucose, asthma, bipolar disease, and severe obesity with a BMI of over 40, who reports sleep disturbances including difficulty initiating and maintaining sleep, worsening depression. Of note, he was hospitalized for depression and psychosis in November 2021 and was recommended to have a follow-up outpatient with behavioral health. He reports that he has not been referred to psychiatry and has never seen a psychiatrist. He does endorse residual depression but denies any suicidal ideations. He has been off his Zyprexa. It was reduced from 15 mg to 5 mg as I understand and then he ran out of it about 2 weeks ago. He is not notified you of this. He has an appointment pending with you in March.    I reviewed Nathaniel chart office note from 05/08/2020.  I had previously evaluated him in Nathaniel fall 2019 for sleep apnea concerns.  He was advised to proceed with sleep testing but he wanted to think about it and discuss it with his mother. He was contacted by Nathaniel sleep lab twice by phone and a letter was sent to call back to schedule his sleep test. He did not call back. He was lost to follow-up since then.  His Epworth sleepiness score is 1 out of 24, fatigue severity score is 51 out of 63.  He had a tonsillectomy in 2020 under Dr. Jearld Nathaniel [name].  He has had weight fluctuation over time, compared to 2019, he has had a significant weight gain. He is working on weight loss. He is currently not working and has not had a very set sleep schedule. He goes to bed between 11 or midnight  for Nathaniel most part, may not be asleep until 3 or 4 AM and does not sleep very long, he tries to get out of bed between 9 and 10. He lives with his mom. He quit smoking marijuana some 3-1/2 to 4 months ago, does not smoke cigarettes, drinks alcohol occasionally, caffeine in Nathaniel form of coffee, 1 cup/day on average. They have no pets in Nathaniel house. He has no nocturia. He has occasional morning headaches. He worked [place] [employer] before. He reports that his father told him that he has sleep apnea but father does not have a CPAP machine.  Nathaniel Nathaniel [name]'s allergies, current medications, family history, past medical history, past social history, past surgical history and problem list were reviewed and updated as appropriate.   Previously (copied from previous notes for reference):   Nathaniel Nathaniel [name] is a 20 year old right handed male with an underlying medical history of asthma, allergic rhinitis and obesity, who reports snoring and Difficulty with sleep initiation, some daytime tiredness reported. I reviewed your office note from 12/07/2017. His Epworth sleepiness score is 2 out of 24, fatigue score is 27/63. He is single, lives with his mother, works at [employer] as an Engineer, [employer] group leader and has college classes Mondays, Wednesdays, and Fridays, 9-12 typically. He is a nonsmoker and does not utilize alcohol, drinks caffeine in Nathaniel form of coffee, one cup per day on  average, occasional tea, rare sodas. He has gained weight in Nathaniel past couple of years. He has had very occasional mild her morning headaches, no history of migraines. He was diagnosed with asthma as a child. He has a family history of snoring but is not aware of any family history of OSA. He has had difficulty falling asleep for Nathaniel past few years, maybe as long as 5 or 6 years even. Once he's asleep he is typically able to maintain sleep. He denies night to night nocturia or restless leg symptoms. He has tried melatonin and while it  helps him go to sleep he has some daytime grogginess from it. He has a TV in his bedroom and typically falls asleep with his TV on. There are no pets in Nathaniel household. He has no siblings. Bedtime may be around 11 or midnight, rise time varies depending on his work and class schedule. When he has to get up early for an appointment he feels tired.  His Past Medical History Is Significant For: Past Medical History:  Diagnosis Date  . Allergic rhinitis   . Chronic tonsillar hypertrophy    ENT did tonsillectomy 2020  . Elevated blood pressure reading without diagnosis of hypertension   . IFG (impaired fasting glucose) 12/2017   Gluc 107, HbA1c 5.5%  . Obesity, Class II, BMI 35-39.9   . OSA (obstructive sleep apnea) 02/2018   Sleep study recommended by Dr. Ivory Broad considering it.  . Severe persistent asthma    Dr. Sharyn Lull    His Past Surgical History Is Significant For: Past Surgical History:  Procedure Laterality Date  . TONSILLECTOMY  06/03/2018    His Family History Is Significant For: Family History  Problem Relation Age of Onset  . Heart attack Other   . Prostate cancer Other   . Hypertension Father   . Hypertension Maternal Grandfather   . Cancer Maternal Grandfather   . Asthma Neg Hx     His Social History Is Significant For: Social History   Socioeconomic History  . Marital status: Single    Spouse name: Not on file  . Number of children: Not on file  . Years of education: Not on file  . Highest education level: Not on file  Occupational History  . Not on file  Tobacco Use  . Smoking status: Never Smoker  . Smokeless tobacco: Never Used  Vaping Use  . Vaping Use: Never used  Substance and Sexual Activity  . Alcohol use: No  . Drug use: No  . Sexual activity: Not on file  Other Topics Concern  . Not on file  Social History Narrative   Single, lives with mom in [place].   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   No T/A/Ds.    Social Determinants of Health   Financial Resource Strain: Not on file  Food Insecurity: Not on file  Transportation Needs: Not on file  Physical Activity: Not on file  Stress: Not on file  Social Connections: Not on file    His Allergies Are:  Allergies  Allergen Reactions  . Amoxicillin Shortness Of Breath and Swelling  :   His Current Medications Are:  Outpatient Encounter Medications as of 06/26/2020  Medication Sig  . albuterol (VENTOLIN HFA) 108 (90 Base) MCG/ACT inhaler INHALE 2 PUFFS INTO Nathaniel LUNGS EVERY 6 (SIX) HOURS AS NEEDED. FOR WHEEZING OR SHORTNESS OF BREATH (Nathaniel [name] taking differently: Inhale 2 puffs into Nathaniel lungs every 6 (six)  hours as needed for wheezing or shortness of breath.)  . Budeson-Glycopyrrol-Formoterol (BREZTRI AEROSPHERE) 160-9-4.8 MCG/ACT AERO 2 inhalations 2 times per day (Nathaniel [name] taking differently: Inhale 2 puffs into Nathaniel lungs 2 (two) times daily.)  . cetirizine (ZYRTEC) 10 MG tablet Take 10 mg by mouth daily.  . Cholecalciferol (VITAMIN D PO) Take 2,000 Units by mouth daily.  [name] escitalopram (LEXAPRO) 10 MG tablet Take 1 tablet (10 mg total) by mouth daily.  . metoprolol tartrate (LOPRESSOR) 25 MG tablet Take 1 tablet (25 mg total) by mouth 2 (two) times daily.  . [DISCONTINUED] OLANZapine (ZYPREXA) 15 MG tablet Take 2 tablets (30 mg total) by mouth at bedtime.   No facility-administered encounter medications on file as of 06/26/2020.  :  Review of Systems:  Out of a complete 14 point review of systems, all are reviewed and negative with Nathaniel exception of these symptoms as listed below: Review of Systems  Neurological:       Here for sleep consult. No prior sleep study was seen back in 2019 but did not pursue testing. Reports he does snore at night and father has OSA. Epworth Sleepiness Scale 0= would never doze 1= slight chance of dozing 2= moderate chance of dozing 3= high chance of dozing  Sitting and reading:0 Watching TV:1 Sitting  inactive in a public place (ex. Theater or meeting):0 As a passenger in a car for an hour without a break:0 Lying down to rest in Nathaniel afternoon:0 Sitting and talking to someone:0 Sitting quietly after lunch (no alcohol):0 In a car, while stopped in traffic:0 Total:1     Objective:  Neurological Exam  Physical Exam Physical Examination:   Vitals:   06/26/20 0825  BP: (!) 152/94  Pulse: 87  SpO2: 97%    General Examination: Nathaniel Nathaniel [name] is a very pleasant 20 y.o. male in no acute distress. He appears well-developed and well-nourished and well groomed.   HEENT: Normocephalic, atraumatic, pupils are equal, round and reactive to light. Extraocular tracking is preserved, hearing grossly intact. Face is symmetric with normal facial animation, speech is clear but very scant, no dysarthria, mild hypophonia. Neck is supple, no carotid bruits. Airway examination reveals adequate dental hygiene, tongue protrudes centrally and palate elevates symmetrically. Tonsils absent. Mallampati class II or III. Redundant soft palate noted. Neck circumference of 19-1/8 inches.  Chest: Clear to auscultation without wheezing, rhonchi or crackles noted.  Heart: S1+S2+0, regular and normal without murmurs, rubs or gallops noted.   Abdomen: Soft, non-tender and non-distended.  Extremities: There is no  obvious edema in Nathaniel distal lower extremities bilaterally.   Skin: Warm and dry.  Musculoskeletal: exam reveals no obvious joint deformities, tenderness or joint swelling or erythema.   Neurologically:  Mental status: Nathaniel Nathaniel [name] is awake, alert. Speech is very scant. He is not very elaborate with his history. Mood is constricted and affect is flat. Cranial nerves II - XII are as described above under HEENT exam.  Motor exam: Normal bulk, strength and tone is noted. There is no tremor. Fine motor skills and coordination: intact grossly.   Cerebellar testing: No dysmetria or intention tremor. There  is no truncal or gait ataxia.  Sensory exam: intact to light touch.  Gait, station and balance: He stands easily. No veering to one side is noted. No leaning to one side is noted. Posture is age-appropriate and stance is narrow based. Gait shows normal stride length and normal pace. No problems turning are noted.  Assessment and Plan:  In  summary, Nathaniel Nathaniel [name] is a very pleasant 20 year old male with an underlying medical history of allergic rhinitis, impaired fasting glucose, asthma, bipolar disease, and severe obesity with a BMI of over 40, who presents for reevaluation for concern for underlying obstructive sleep apnea (OSA). We reviewed Nathaniel diagnosis of OSA again today, Nathaniel prognosis and treatment options. We talked about non-surgical and surgical interventions. I again explained Nathaniel risks of moderate or severe untreated OSA, especially with respect to developing cardiovascular disease down Nathaniel Road, including congestive heart failure, difficult to treat hypertension, cardiac arrhythmias, or stroke. Even type 2 diabetes has, in part, been linked to untreated OSA. Symptoms of untreated OSA include daytime sleepiness, memory problems, mood irritability and mood disorder such as depression and anxiety, lack of energy, as well as recurrent headaches, especially morning headaches. We talked about trying to maintain a healthy lifestyle in general, as well as Nathaniel importance of weight control. I advised Nathaniel Nathaniel [name] not to drive when feeling sleepy. I recommended Nathaniel following at this time: sleep study.   Of note, he endorses residual depression. He reports that he is off Nathaniel Zyprexa because he ran out. He is encouraged to call your office for a prescription or discuss alternative management as he felt Nathaniel Zyprexa was not helpful in making him sleepy at Nathaniel 15 mg strength. In addition, in reviewing his chart, he was advised to follow-up as an outpatient with behavioral health and a referral to psychiatry was  placed in November 2021. He is encouraged to talk to you about referral to psychiatry as well. He denies any suicidal ideations at this time but does appear to be depressed.  He is agreeable to proceeding with sleep evaluation.  I explained Nathaniel sleep test procedure to Nathaniel Nathaniel [name] and also outlined possible surgical and non-surgical treatment options of OSA.  He would be willing to try CPAP or AutoPap therapy if Nathaniel need arises. We will pick up our discussion about treatment options after testing and we will call him with Nathaniel results first.  I answered all his questions today and Nathaniel Nathaniel [name] was in agreement. I plan to see him back after Nathaniel sleep study is completed and encouraged him to call with any interim questions, concerns, problems or updates.   Thank you very much for allowing me to participate in Nathaniel care of this nice Nathaniel [name]. If I can be of any further assistance to you please do not hesitate to call me at [phone].  Sincerely,   Huston Foley, MD, PhD

## 2020-06-27 ENCOUNTER — Telehealth: Payer: Self-pay

## 2020-06-27 NOTE — Telephone Encounter (Signed)
LVM for pt to call me back to schedule sleep study  

## 2020-07-02 ENCOUNTER — Other Ambulatory Visit: Payer: Self-pay

## 2020-07-02 ENCOUNTER — Ambulatory Visit (INDEPENDENT_AMBULATORY_CARE_PROVIDER_SITE_OTHER): Payer: No Typology Code available for payment source | Admitting: Allergy and Immunology

## 2020-07-02 ENCOUNTER — Encounter: Payer: Self-pay | Admitting: Allergy and Immunology

## 2020-07-02 ENCOUNTER — Telehealth: Payer: Self-pay

## 2020-07-02 VITALS — BP 122/88 | HR 88 | Temp 98.2°F | Resp 16

## 2020-07-02 DIAGNOSIS — J4550 Severe persistent asthma, uncomplicated: Secondary | ICD-10-CM | POA: Diagnosis not present

## 2020-07-02 DIAGNOSIS — J3089 Other allergic rhinitis: Secondary | ICD-10-CM

## 2020-07-02 NOTE — Telephone Encounter (Signed)
LVM for pt to call me back to schedule sleep study  

## 2020-07-02 NOTE — Patient Instructions (Addendum)
  1. Continue Breztri - 2 inhalations 1-2 times per day depending on disease activity  2. Continue OTC Flonase- 1 spray each nostril 1-2 times per day depending on disease activity  3. Continue ProAir HFA 2 puffs every 4-6 hours if needed  4. Continue Zyrtec 10 mg once a day if needed  5. Return to clinic in 6 months or earlier if problem

## 2020-07-02 NOTE — Progress Notes (Signed)
[place] [facility] [place] [facility] [place] [facility] [place] [facility] [place]   Follow-up Note  Referring Provider: Jeoffrey Massed, MD Primary Provider: System, Provider Not In  Date of Office Visit: 07/02/2020  Subjective:   Nathaniel [name] (DOB: [birth year]) is a 20 y.o. male who [facility] in re-evaluation of the following:  HPI: Treyshaun returns to this clinic in evaluation of asthma and allergic rhinitis.  His last visit to this clinic was 02 January 2020.  Overall he has really done well with his airway, not requiring a systemic steroid or antibiotic for any type of airway issue.  He has been relying on the use of his triple inhaler twice a day.  He did attempt to consolidate his triple inhaler to 1 time per day but did better on twice a day.  While utilizing this agent twice a day he rarely uses a short acting bronchodilator and he can exert himself without any problem.  He has had very little issue with his nose recently.  He uses a nasal steroid on occasion.  He has received 3 Pfizer COVID vaccinations and the flu vaccine.  He apparently had a significant issue develop with depression and anxiety and is working through that issue with a psychiatrist and utilizing various medications.  Allergies as of 07/02/2020      Reactions   Amoxicillin Shortness Of Breath, Swelling      Medication List       adapalene 0.1 % cream Commonly known as: DIFFERIN Apply topically at bedtime.   albuterol 108 (90 Base) MCG/ACT inhaler Commonly known as: Ventolin HFA INHALE 2 PUFFS INTO THE LUNGS EVERY 6 (SIX) HOURS AS NEEDED. FOR WHEEZING OR SHORTNESS OF BREATH   Breztri Aerosphere 160-9-4.8 MCG/ACT Aero Generic drug: Budeson-Glycopyrrol-Formoterol 2 inhalations 2 times per day   cetirizine 10 MG tablet Commonly known as: ZYRTEC Take 10 mg by mouth daily.   metoprolol tartrate 25 MG tablet Commonly known as: LOPRESSOR Take 1 tablet (25 mg total) by mouth 2 (two)  times daily.   VITAMIN D PO Take 2,000 Units by mouth daily.       Past Medical History:  Diagnosis Date  . Allergic rhinitis   . Chronic tonsillar hypertrophy    ENT did tonsillectomy 2020  . Elevated blood pressure reading without diagnosis of hypertension   . IFG (impaired fasting glucose) 12/2017   Gluc 107, HbA1c 5.5%  . Obesity, Class II, BMI 35-39.9   . OSA (obstructive sleep apnea) 02/2018   Sleep study recommended by Dr. Ivory Broad considering it.  . Severe persistent asthma    Dr. Sharyn Lull    Past Surgical History:  Procedure Laterality Date  . TONSILLECTOMY  06/03/2018    Review of systems negative except as noted in HPI / PMHx or noted below:  Review of Systems  Constitutional: Negative.   HENT: Negative.   Eyes: Negative.   Respiratory: Negative.   Cardiovascular: Negative.   Gastrointestinal: Negative.   Genitourinary: Negative.   Musculoskeletal: Negative.   Skin: Negative.   Neurological: Negative.   Endo/Heme/Allergies: Negative.   Psychiatric/Behavioral: Negative.      Objective:   Vitals:   07/02/20 1341  BP: 122/88  Pulse: 88  Resp: 16  Temp: 98.2 F (36.8 C)  SpO2: 99%          Physical Exam Constitutional:      Appearance: He is not diaphoretic.  HENT:     Head: Normocephalic.  Right Ear: Tympanic membrane, ear canal and external ear normal.     Left Ear: Tympanic membrane, ear canal and external ear normal.     Nose: Nose normal. No mucosal edema or rhinorrhea.     Mouth/Throat:     Mouth: Oropharynx is clear and moist and mucous membranes are normal.     Pharynx: Uvula midline. No oropharyngeal exudate.  Eyes:     Conjunctiva/sclera: Conjunctivae normal.  Neck:     Thyroid: No thyromegaly.     Trachea: Trachea normal. No tracheal tenderness or tracheal deviation.  Cardiovascular:     Rate and Rhythm: Normal rate and regular rhythm.     Heart sounds: Normal heart sounds, S1 normal and S2 normal. No murmur  heard.   Pulmonary:     Effort: No respiratory distress.     Breath sounds: Normal breath sounds. No stridor. No wheezing or rales.  Musculoskeletal:        General: No edema.  Lymphadenopathy:     Head:     Right side of head: No tonsillar adenopathy.     Left side of head: No tonsillar adenopathy.     Cervical: No cervical adenopathy.  Skin:    Findings: No erythema or rash.     Nails: There is no clubbing.  Neurological:     Mental Status: He is alert.     Diagnostics:    Spirometry was performed and demonstrated an FEV1 of 4.37 at 91 % of predicted.   Assessment and Plan:   1. Asthma, severe persistent, well-controlled   2. Other allergic rhinitis     1. Continue Breztri - 2 inhalations 1-2 times per day depending on disease activity  2. Continue OTC Flonase- 1 spray each nostril 1-2 times per day depending on disease activity  3. Continue ProAir HFA 2 puffs every 4-6 hours if needed  4. Continue Zyrtec 10 mg once a day if needed  5. Return to clinic in 6 months or earlier if problem  Rodrigo appears to be doing very well from a respiratory standpoint on his current plan.  He has a very good understanding of his disease state and how his medications work and appropriate dosing of his medications.  Assuming he does well with this plan I will see him back in his clinic in 6 months or earlier if there is a problem.  Laurette Schimke, MD Allergy / Immunology  [facility]

## 2020-07-03 ENCOUNTER — Encounter: Payer: Self-pay | Admitting: Allergy and Immunology

## 2020-07-08 ENCOUNTER — Telehealth: Payer: Self-pay

## 2020-07-08 NOTE — Telephone Encounter (Signed)
We have attempted to call the patient two times to schedule sleep study.  Patient has been unavailable at the phone numbers we have on file and has not returned our calls. If patient calls back we will schedule them for their sleep study.  

## 2020-07-31 ENCOUNTER — Ambulatory Visit (INDEPENDENT_AMBULATORY_CARE_PROVIDER_SITE_OTHER): Payer: No Typology Code available for payment source | Admitting: Neurology

## 2020-07-31 DIAGNOSIS — Z820 Family history of epilepsy and other diseases of the nervous system: Secondary | ICD-10-CM

## 2020-07-31 DIAGNOSIS — R0683 Snoring: Secondary | ICD-10-CM

## 2020-07-31 DIAGNOSIS — G4733 Obstructive sleep apnea (adult) (pediatric): Secondary | ICD-10-CM | POA: Diagnosis not present

## 2020-07-31 DIAGNOSIS — R635 Abnormal weight gain: Secondary | ICD-10-CM

## 2020-07-31 DIAGNOSIS — R519 Headache, unspecified: Secondary | ICD-10-CM

## 2020-07-31 DIAGNOSIS — R4589 Other symptoms and signs involving emotional state: Secondary | ICD-10-CM

## 2020-07-31 DIAGNOSIS — Z9189 Other specified personal risk factors, not elsewhere classified: Secondary | ICD-10-CM

## 2020-08-01 NOTE — Procedures (Signed)
   [facility]  HOME SLEEP TEST (Watch PAT)  STUDY DATE: 07-31-20  DOB: 10-Sep-2000  MRN: [id number]  ORDERING CLINICIAN: Huston Foley, MD, PhD  REFERRING CLINICIAN: Dr. Donzetta Kohut  CLINICAL INFORMATION/HISTORY: 20 year old man with a history of allergic rhinitis, impaired fasting glucose, asthma, bipolar disease, and severe obesity with a BMI of over 40, who reports sleep disturbances including difficulty initiating and maintaining sleep, worsening depression.   Epworth sleepiness score: 1/24.  BMI: 42.1 kg/m  Neck Circumference: 19.125 "  FINDINGS:   Total Record Time (hours, min): 5 H 18 min  Total Sleep Time (hours, min):  3 H 46 min   Percent REM (%):    19.87 %   Calculated pAHI (per hour): 9.0      REM pAHI: 22.7    NREM pAHI: 5.6 Supine AHI: N/A   Oxygen Saturation (%) Mean: 96  Minimum oxygen saturation (%):        91   O2 Saturation Range (%): 91-96  O2Saturation (minutes) <=88%: 0 min   Pulse Mean (bpm):    91  Pulse Range (64-109)   IMPRESSION: OSA (obstructive sleep apnea), mild  RECOMMENDATION:  This home sleep test demonstrates overall mild obstructive sleep apnea - by number of events - with a total AHI of 9/hour and O2 nadir of 91%. Treatment options include positive airway pressure therapy with autoPAP trial/titration at home, weight loss along with avoidance of the supine sleep position, an oral appliance through a qualified dentist, airway surgical procedures (through ENT).  Please note that untreated obstructive sleep apnea may carry additional perioperative morbidity. Patients with significant obstructive sleep apnea should receive perioperative PAP therapy and the surgeons and particularly the anesthesiologist should be informed of the diagnosis and the severity of the sleep disordered breathing. The patient should be cautioned not to drive, work at heights, or operate dangerous or heavy equipment when tired or sleepy. Review and reiteration of  good sleep hygiene measures should be pursued with any patient. Other causes of the patient's symptoms, including circadian rhythm disturbances, an underlying mood disorder, medication effect and/or an underlying medical problem cannot be ruled out based on this test. Clinical correlation is recommended.   The patient and his referring provider will be notified of the test results. The patient will be seen in follow up in sleep clinic at [facility], as necessary.  I certify that I have reviewed the raw data recording prior to the issuance of this report in accordance with the standards of the American Academy of Sleep Medicine (AASM).  INTERPRETING PHYSICIAN:  Huston Foley, MD, PhD  Board Certified in Neurology and Sleep Medicine  [facility] [address] [place], [place] [address] [phone]

## 2020-08-01 NOTE — Progress Notes (Signed)
Patient referred by Dr. Donzetta Kohut, seen by me on 06/26/20, HST on 07/31/20.    Please call and notify the patient that the recent home sleep test showed obstructive sleep apnea. OSA is overall mild, mostly by number of events with an AHI of 9/h and lowest oxygen saturation was above 90 at 91%.  If you would like to try treatment with an AutoPap machine, I would be happy to order one.  Alternative treatment options include weight loss and avoiding sleeping on the back, dental option with the help of a dentist and surgical options as a last resort but not recommended for rather mild sleep apnea generally speaking.  If he would like to pursue treatment with a positive airway pressure machine such as the AutoPap, please let me know and I will place an order.  If he would like to talk to a dentist about the oral appliance option, we can make a referral as well.   Again, let me know.   Huston Foley, MD, PhD [facility]

## 2020-08-26 ENCOUNTER — Telehealth: Payer: Self-pay | Admitting: Neurology

## 2020-08-26 DIAGNOSIS — G4733 Obstructive sleep apnea (adult) (pediatric): Secondary | ICD-10-CM

## 2020-08-26 NOTE — Telephone Encounter (Signed)
Please see my chart message for reference.  We will set patient up with autoPAP at home, as per his preference. Pls process order and notify patient and set up FU in 10 weeks with me or NP.

## 2020-08-26 NOTE — Telephone Encounter (Signed)
See my chart message from 08/26/20.

## 2020-09-18 ENCOUNTER — Telehealth: Payer: Self-pay | Admitting: Neurology

## 2020-09-18 NOTE — Telephone Encounter (Signed)
I have asked [facility] to call about this, order was sent on 08/26/20.

## 2020-09-18 NOTE — Telephone Encounter (Signed)
Pt called stating that [facility] informed him that they do not have any information on file for him. Pt would like to know if his information can be recent to them. Please advise.

## 2021-01-07 ENCOUNTER — Ambulatory Visit (INDEPENDENT_AMBULATORY_CARE_PROVIDER_SITE_OTHER): Payer: No Typology Code available for payment source | Admitting: Allergy and Immunology

## 2021-01-07 ENCOUNTER — Other Ambulatory Visit: Payer: Self-pay

## 2021-01-07 VITALS — BP 126/86 | HR 91 | Temp 98.2°F | Resp 18 | Ht 73.0 in | Wt 314.2 lb

## 2021-01-07 DIAGNOSIS — J3089 Other allergic rhinitis: Secondary | ICD-10-CM

## 2021-01-07 DIAGNOSIS — J4550 Severe persistent asthma, uncomplicated: Secondary | ICD-10-CM | POA: Diagnosis not present

## 2021-01-07 MED ORDER — BREZTRI AEROSPHERE 160-9-4.8 MCG/ACT IN AERO: 2.0000 | INHALATION_SPRAY | Freq: Two times a day (BID) | RESPIRATORY_TRACT | 5 refills | Status: DC

## 2021-01-07 MED ORDER — ALBUTEROL SULFATE HFA 108 (90 BASE) MCG/ACT IN AERS: 2.0000 | INHALATION_SPRAY | Freq: Four times a day (QID) | RESPIRATORY_TRACT | 1 refills | Status: DC | PRN

## 2021-01-07 MED ORDER — CETIRIZINE HCL 10 MG PO TABS: 10.0000 mg | ORAL_TABLET | Freq: Every day | ORAL | 5 refills | Status: DC

## 2021-01-07 NOTE — Progress Notes (Signed)
[place] [facility] [place] [facility] [place] - [place] [facility] [place]   Follow-up Note  Referring Provider: No ref. provider found Primary Provider: System, Provider Not In Date of Office Visit: 01/07/2021  Subjective:   Nathaniel [name] (DOB: 03-25-2001) is a 20 y.o. male who [facility] in re-evaluation of the following:  HPI: Nathaniel [name] returns to this clinic in reevaluation of asthma and allergic rhinitis.  His last visit to this clinic was 02 July 2020.  He has had an excellent interval of time regarding his asthma without the need for systemic steroid and rare use of a short acting bronchodilator while he continues on Breztri currently utilized 1 time per day.  Likewise, has had very little problems with his nose while utilizing a nasal steroid on occasion.  He has not required a antibiotic to treat an episode of sinusitis.  Allergies as of 01/07/2021       Reactions   Amoxicillin Shortness Of Breath, Swelling        Medication List    albuterol 108 (90 Base) MCG/ACT inhaler Commonly known as: Ventolin HFA INHALE 2 PUFFS INTO THE LUNGS EVERY 6 (SIX) HOURS AS NEEDED. FOR WHEEZING OR SHORTNESS OF BREATH   Breztri Aerosphere 160-9-4.8 MCG/ACT Aero Generic drug: Budeson-Glycopyrrol-Formoterol 2 inhalations 2 times per day   cetirizine 10 MG tablet Commonly known as: ZYRTEC Take 10 mg by mouth daily.   metoprolol tartrate 25 MG tablet Commonly known as: LOPRESSOR Take 1 tablet (25 mg total) by mouth 2 (two) times daily.   VITAMIN D PO Take 2,000 Units by mouth daily.    Past Medical History:  Diagnosis Date   Allergic rhinitis    Chronic tonsillar hypertrophy    ENT did tonsillectomy 2020   Elevated blood pressure reading without diagnosis of hypertension    IFG (impaired fasting glucose) 12/2017   Gluc 107, HbA1c 5.5%   Obesity, Class II, BMI 35-39.9    OSA (obstructive sleep apnea) 02/2018   Sleep study recommended by Dr. Ivory Broad  considering it.   Severe persistent asthma    Dr. Sharyn Lull    Past Surgical History:  Procedure Laterality Date   TONSILLECTOMY  06/03/2018    Review of systems negative except as noted in HPI / PMHx or noted below:  Review of Systems  Constitutional: Negative.   HENT: Negative.    Eyes: Negative.   Respiratory: Negative.    Cardiovascular: Negative.   Gastrointestinal: Negative.   Genitourinary: Negative.   Musculoskeletal: Negative.   Skin: Negative.   Neurological: Negative.   Endo/Heme/Allergies: Negative.   Psychiatric/Behavioral: Negative.      Objective:   Vitals:   01/07/21 1626  BP: 126/86  Pulse: 91  Resp: 18  Temp: 98.2 F (36.8 C)  SpO2: 99%   Height: 6\' 1"  (185.4 cm)  Weight: (!) 314 lb 4 oz (142.5 kg)   Physical Exam Constitutional:      Appearance: He is not diaphoretic.  HENT:     Head: Normocephalic.     Right Ear: Tympanic membrane, ear canal and external ear normal.     Left Ear: Tympanic membrane, ear canal and external ear normal.     Nose: Nose normal. No mucosal edema or rhinorrhea.     Mouth/Throat:     Pharynx: Uvula midline. No oropharyngeal exudate.  Eyes:     Conjunctiva/sclera: Conjunctivae normal.  Neck:     Thyroid: No thyromegaly.     Trachea: Trachea normal.  No tracheal tenderness or tracheal deviation.  Cardiovascular:     Rate and Rhythm: Normal rate and regular rhythm.     Heart sounds: Normal heart sounds, S1 normal and S2 normal. No murmur heard. Pulmonary:     Effort: No respiratory distress.     Breath sounds: Normal breath sounds. No stridor. No wheezing or rales.  Lymphadenopathy:     Head:     Right side of head: No tonsillar adenopathy.     Left side of head: No tonsillar adenopathy.     Cervical: No cervical adenopathy.  Skin:    Findings: No erythema or rash.     Nails: There is no clubbing.  Neurological:     Mental Status: He is alert.    Diagnostics:    Spirometry was performed and demonstrated  an FEV1 of 4.60 at 94 % of predicted.  Assessment and Plan:   1. Asthma, severe persistent, well-controlled   2. Other allergic rhinitis    1. Continue Breztri - 2 inhalations 1-2 times per day depending on disease activity  2. Continue OTC Flonase- 1 spray each nostril 1-2 times per day depending on disease activity  3. Continue ProAir HFA 2 puffs every 4-6 hours if needed  4. Continue Zyrtec 10 mg once a day if needed  5. Return to clinic in 6 months or earlier if problem  6. Obtain fall flu vaccine   Nathaniel [name] appears to be doing quite well on his current therapy and I do not think that there is a need for him to return to this clinic other than every 6 months while he continues on a combination of anti-inflammatory agents.  He has a very good understanding of his disease state and how his medications work and appropriate dosing of his medications depending on disease activity.  Assuming he does well I will see him back in this clinic in 6 months or earlier if there is a problem.  Laurette Schimke, MD Allergy / Immunology [facility]

## 2021-01-07 NOTE — Patient Instructions (Signed)
  1. Continue Breztri - 2 inhalations 1-2 times per day depending on disease activity  2. Continue OTC Flonase- 1 spray each nostril 1-2 times per day depending on disease activity  3. Continue ProAir HFA 2 puffs every 4-6 hours if needed  4. Continue Zyrtec 10 mg once a day if needed  5. Return to clinic in 6 months or earlier if problem  6. Obtain fall flu vaccine

## 2021-01-08 ENCOUNTER — Encounter: Payer: Self-pay | Admitting: Allergy and Immunology

## 2021-02-05 ENCOUNTER — Telehealth: Payer: Self-pay | Admitting: Neurology

## 2021-02-05 NOTE — Telephone Encounter (Signed)
Pt called stating that he is needing an appt in the beginning of November to be in compliance. There are no available appts with NP or MD at the time the schedules were check. Please advise.

## 2021-02-05 NOTE — Telephone Encounter (Signed)
Will check with MM/NP for using admin slot.

## 2021-02-06 NOTE — Telephone Encounter (Signed)
Pt has been scheduled with NP Ihor Austin on Nov. 3rd @12 :45pm

## 2021-02-06 NOTE — Telephone Encounter (Signed)
Please schedule patient with Shanda Bumps NP.

## 2021-03-05 ENCOUNTER — Telehealth: Payer: Self-pay

## 2021-03-05 NOTE — Telephone Encounter (Signed)
PHONE RM CAN RELAY  Contacted to see if he has been using his autoPAP as there is no data on file remotely. LVM rq he bring the machine in with him to his appt if so we can manually download info or if he has not been using to give [place] a call back for reschedule.

## 2021-03-06 ENCOUNTER — Ambulatory Visit (INDEPENDENT_AMBULATORY_CARE_PROVIDER_SITE_OTHER): Payer: Self-pay | Admitting: Adult Health

## 2021-03-06 NOTE — Progress Notes (Signed)
Visit rescheduled as unable to obtain download from CPAP machine - he was advised to reach out to his DME company for further assistance and to reschedule once that was done.

## 2021-03-24 ENCOUNTER — Telehealth: Payer: Self-pay

## 2021-03-24 NOTE — Telephone Encounter (Signed)
Received message from Verner Mould, Roma Kayser, Carolynn Serve, RN it looks like the office tried to reach patient on 11/04 with NO response. I will see if they can reach out again.        Previous Messages   ----- Message -----  From: Sunday Spillers, RN  Sent: 03/13/2021   5:04 PM EST  To: Jari Favre   Checking on update for this.  ----- Message -----  From: Jari Favre  Sent: 03/07/2021   8:57 AM EST  To: Sunday Spillers, RN   I have emailed the [place] office to see if they can bring patient in and see what is going on with the machine.   ----- Message -----  From: Sunday Spillers, RN  Sent: 03/06/2021   1:34 PM EDT  To: Wilford Sports   Hello!  We had pt Nathaniel [name] come by the office today.  Pt was adamant he is using his machine every night we have asked the pt to  bring his machine by to have it looked at b/c we only saw 3 days worth of data after multiple attempts of d/l.   DOB [birth year].  Thanks!

## 2021-06-27 ENCOUNTER — Other Ambulatory Visit: Payer: Self-pay | Admitting: Allergy and Immunology

## 2021-07-10 ENCOUNTER — Ambulatory Visit: Payer: No Typology Code available for payment source | Admitting: Allergy & Immunology

## 2021-07-22 ENCOUNTER — Telehealth: Payer: Self-pay | Admitting: Neurology

## 2021-07-22 NOTE — Telephone Encounter (Signed)
thanks

## 2021-07-22 NOTE — Telephone Encounter (Signed)
Pt was scheduled for his initial CPAP on 09-01-21 ?Pt was informed to bring machine and power cord to the appointemnt ?DME: [facility] ?Phone: [phone], press option 1 ?Fax: [phone] ?CPAP switch out (pt received new Resvent Ibreeze cpap S/N # [id number] (changed in [facility]). 07-17-2021. Initial cpap appt needed 08-18-2021 - 10-16-2021 ? ?

## 2021-07-27 ENCOUNTER — Other Ambulatory Visit: Payer: Self-pay | Admitting: Allergy and Immunology

## 2021-08-07 ENCOUNTER — Ambulatory Visit (INDEPENDENT_AMBULATORY_CARE_PROVIDER_SITE_OTHER): Payer: No Typology Code available for payment source | Admitting: Allergy & Immunology

## 2021-08-07 ENCOUNTER — Encounter: Payer: Self-pay | Admitting: Allergy & Immunology

## 2021-08-07 VITALS — BP 122/78 | HR 103 | Temp 98.3°F | Resp 19

## 2021-08-07 DIAGNOSIS — J3089 Other allergic rhinitis: Secondary | ICD-10-CM

## 2021-08-07 DIAGNOSIS — J4550 Severe persistent asthma, uncomplicated: Secondary | ICD-10-CM

## 2021-08-07 MED ORDER — BREZTRI AEROSPHERE 160-9-4.8 MCG/ACT IN AERO: 2.0000 | INHALATION_SPRAY | Freq: Two times a day (BID) | RESPIRATORY_TRACT | 5 refills | Status: DC

## 2021-08-07 NOTE — Patient Instructions (Signed)
1. Continue Breztri - 2 inhalations 1-2 times per day depending on disease activity (lung testing looks awesome). ? ?2. Continue OTC Flonase- 1 spray each nostril 1-2 times per day depending on disease activity ? ?3. Continue ProAir HFA 2 puffs every 4-6 hours if needed ? ?4. Continue Zyrtec 10 mg once a day if needed ? ?5. Return in about 6 months (around 02/06/2022).  ? ? ?Please inform us of any Emergency Department visits, hospitalizations, or changes in symptoms. Call us before going to the ED for breathing or allergy symptoms since we might be able to fit you in for a sick visit. Feel free to contact us anytime with any questions, problems, or concerns. ? ?It was a pleasure to see you again today! ? ?Websites that have reliable patient information: ?1. American Academy of Asthma, Allergy, and Immunology: www.aaaai.org ?2. Food Allergy Research and Education (FARE): foodallergy.org ?3. Mothers of Asthmatics: http://www.asthmacommunitynetwork.org ?4. [employer] of Allergy, Asthma, and Immunology: MissingWeapons.ca ? ? ?COVID-19 Vaccine Information can be found at: PodExchange.nl For questions related to vaccine distribution or appointments, please email [email or web address] or call [phone].  ? ?We realize that you might be concerned about having an allergic reaction to the COVID19 vaccines. To help with that concern, WE ARE OFFERING THE COVID19 VACCINES IN OUR OFFICE! Ask the front desk for dates!  ? ? ? ??Like? [place] on Facebook and Instagram for our latest updates!  ?  ? ? ?A healthy democracy works best when [employer] participate! Make sure you are registered to vote! If you have moved or changed any of your contact information, you will need to get this updated before voting! ? ?In some cases, you MAY be able to register to vote online: [email or web address] ? ? ? ? ? ? ? ? ? ? ?

## 2021-08-07 NOTE — Progress Notes (Signed)
? ?FOLLOW UP ? ?Date of Service/Encounter:  08/07/21 ? ? ?Assessment:  ? ?Moderate persistent asthma, uncomplicated - well controlled ?  ?Allergic rhinoconjunctivitis ? ?Plan/Recommendations:  ? ?1. Continue Breztri - 2 inhalations 1-2 times per day depending on disease activity (lung testing looks awesome). ? ?2. Continue OTC Flonase- 1 spray each nostril 1-2 times per day depending on disease activity ? ?3. Continue ProAir HFA 2 puffs every 4-6 hours if needed ? ?4. Continue Zyrtec 10 mg once a day if needed ? ?5. Return in about 6 months (around 02/06/2022).  ? ? ?Subjective:  ? ?Nathaniel [name] is a 21 y.o. male presenting today for follow up of  ?Chief Complaint  ?Patient presents with  ? Asthma  ? ? ?Nathaniel [name] has a history of the following: ?Patient Active Problem List  ? Diagnosis Date Noted  ? Generalized anxiety disorder 03/14/2020  ? Psychoactive substance-induced psychosis (HCC) 03/13/2020  ? Obstructive sleep apnea of adult 03/22/2018  ? Hypertrophy, nasal, turbinate 03/22/2018  ? Sore throat 07/13/2017  ? Hypogonadism male 04/02/2015  ? Acquired acanthosis nigricans 11/27/2014  ? Pre-diabetes 07/29/2013  ? Morbid obesity (HCC) 07/29/2013  ? Goiter 07/29/2013  ? Dyspepsia 07/29/2013  ? Essential hypertension, benign 07/29/2013  ? Gynecomastia, male 07/29/2013  ? PHARYNGITIS 04/16/2008  ? Acute upper respiratory infection 04/16/2008  ? INFLUENZA WITH OTHER RESPIRATORY MANIFESTATIONS 02/06/2008  ? HERPETIC WHITLOW 12/12/2007  ? COLD SORE 12/12/2007  ? MIXED RECEPTIVE-EXPRESSIVE LANGUAGE DISORDER 12/12/2007  ? CONSTIPATION 12/12/2007  ? URTICARIA 12/12/2007  ? UNSPECIFIED FETAL AND NEONATAL JAUNDICE 12/12/2007  ? CARDIAC FLOW MURMUR 12/12/2007  ? Perennial allergic rhinitis 12/09/2007  ? ASTHMA, MILD 12/09/2007  ? ? ?History obtained from: chart review and patient. ? ?[name] is a 21 y.o. male presenting for a follow up visit.  He was last seen in September 2022.  At that time, he was continued on Breztri 2  puffs twice daily.  He was also continued on Flonase as well as albuterol and Zyrtec. ? ?Since the last visit, he has done well.  ? ?Asthma/Respiratory Symptom History: He remains on the [place]. This was changesd a couple of years ago. He thinks that this was changed two years ago. He has not had steroids sicne the the last visit. He has done very well.   [name]'s asthma has been well controlled. He has not required rescue medication, experienced nocturnal awakenings due to lower respiratory symptoms, nor have activities of daily living been limited. He has required no Emergency Department or Urgent Care visits for his asthma. He has required zero courses of systemic steroids for asthma exacerbations since the last visit. ACT score today is 25, indicating excellent asthma symptom control.  ? ?Allergic Rhinitis Symptom History: He is normally doing fine, but the weahter changes really make things difficult. Normally it is totally fine. He has not had sinus infections. He is on cetirizine daily and he hasn ot use nasal sprays in quite some time.   He is having some PND which results in a sore throat.   ? ?He has hypertension and has been on metoprolol for a around 6-7 months. He is trying to get off of it and has been trying to lose some weight.  ? ?He is going back to college in the fall. 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 He is trying to work full time until he starts school again in the fall.  ? ?Otherwise, there have  been no changes to his past medical history, surgical history, family history, or social history. ? ? ? ?Review of Systems  ?Constitutional: Negative.  Negative for fever, malaise/fatigue and weight loss.  ?HENT: Negative.  Negative for congestion, ear discharge and ear pain.   ?Eyes:  Negative for pain, discharge and redness.  ?Respiratory:  Negative for cough, sputum production, shortness of breath and wheezing.   ?Cardiovascular: Negative.  Negative  for chest pain and palpitations.  ?Gastrointestinal:  Negative for abdominal pain, constipation, diarrhea, heartburn, nausea and vomiting.  ?Skin: Negative.  Negative for itching and rash.  ?Neurological:  Negative for dizziness and headaches.  ?Endo/Heme/Allergies:  Negative for environmental allergies. Does not bruise/bleed easily.   ? ? ? ?Objective:  ? ?Blood pressure 122/78, pulse (!) 103, temperature 98.3 ?F (36.8 ?C), temperature source Temporal, resp. rate 19, SpO2 98 %. ?There is no height or weight on file to calculate BMI. ? ? ? ?Physical Exam ?Vitals reviewed.  ?Constitutional:   ?   Appearance: He is well-developed.  ?HENT:  ?   Head: Normocephalic and atraumatic.  ?   Right Ear: Tympanic membrane, ear canal and external ear normal.  ?   Left Ear: Tympanic membrane, ear canal and external ear normal.  ?   Nose: No nasal deformity, septal deviation, mucosal edema or rhinorrhea.  ?   Right Turbinates: Not enlarged or swollen.  ?   Left Turbinates: Not enlarged or swollen.  ?   Right Sinus: No maxillary sinus tenderness or frontal sinus tenderness.  ?   Left Sinus: No maxillary sinus tenderness or frontal sinus tenderness.  ?   Mouth/Throat:  ?   Mouth: Mucous membranes are not pale and not dry.  ?   Pharynx: Uvula midline.  ?Eyes:  ?   General: Lids are normal. No allergic shiner.    ?   Right eye: No discharge.     ?   Left eye: No discharge.  ?   Conjunctiva/sclera: Conjunctivae normal.  ?   Right eye: Right conjunctiva is not injected. No chemosis. ?   Left eye: Left conjunctiva is not injected. No chemosis. ?   Pupils: Pupils are equal, round, and reactive to light.  ?Cardiovascular:  ?   Rate and Rhythm: Normal rate and regular rhythm.  ?   Heart sounds: Normal heart sounds.  ?Pulmonary:  ?   Effort: Pulmonary effort is normal. No tachypnea, accessory muscle usage or respiratory distress.  ?   Breath sounds: Normal breath sounds. No wheezing, rhonchi or rales.  ?Chest:  ?   Chest wall: No tenderness.   ?Lymphadenopathy:  ?   Cervical: No cervical adenopathy.  ?Skin: ?   Coloration: Skin is not pale.  ?   Findings: No abrasion, erythema, petechiae or rash. Rash is not papular, urticarial or vesicular.  ?Neurological:  ?   Mental Status: He is alert.  ?Psychiatric:     ?   Behavior: Behavior is cooperative.  ?  ? ?Diagnostic studies:  ? ?Spirometry: results normal (FEV1: 3.69/74%, FVC: 4.10/69%, FEV1/FVC: 90%).  ?  ?Spirometry consistent with normal pattern.  ? ?Allergy Studies: none ? ? ? ? ? ?  ?Malachi Bonds, MD  ?[facility] ? ? ? ? ? ? ?

## 2021-08-12 ENCOUNTER — Encounter: Payer: Self-pay | Admitting: Allergy & Immunology

## 2021-09-01 ENCOUNTER — Ambulatory Visit: Payer: No Typology Code available for payment source | Admitting: Neurology

## 2021-09-02 ENCOUNTER — Encounter: Payer: Self-pay | Admitting: Neurology

## 2021-09-02 ENCOUNTER — Ambulatory Visit (INDEPENDENT_AMBULATORY_CARE_PROVIDER_SITE_OTHER): Payer: No Typology Code available for payment source | Admitting: Neurology

## 2021-09-02 VITALS — BP 124/77 | HR 88 | Ht 73.0 in | Wt 309.0 lb

## 2021-09-02 DIAGNOSIS — R634 Abnormal weight loss: Secondary | ICD-10-CM

## 2021-09-02 DIAGNOSIS — Z789 Other specified health status: Secondary | ICD-10-CM

## 2021-09-02 DIAGNOSIS — G4733 Obstructive sleep apnea (adult) (pediatric): Secondary | ICD-10-CM

## 2021-09-02 NOTE — Patient Instructions (Signed)
It was nice to see you again.  ?I am happy to hear you have had some weight loss and you are working on continuing to lose weight and pursue a healthy lifestyle, keep up the good work! ?I appreciate you trying AutoPap therapy, but you have not been able to tolerate it. I will send a discontinuation order to your DME company so you can turn the machine back in.  ?As discussed, we can consider a referral to dentistry for consideration of an oral appliance. Please let us know, after you have talked to your dentist and I will be happy to make a referral to a dentist of your choosing.  ?We will consider a repeat home sleep test in the future. Please make a follow up appointment in one year.   ? ? ?

## 2021-09-02 NOTE — Progress Notes (Addendum)
Subjective:  ?  ?Patient ID: Nathaniel [name] is a 21 y.o. male. ? ?HPI ? ? ? ?Interim history:  ? ?[name] is a 21 year old right-handed gentleman with an underlying medical history of allergic rhinitis, impaired fasting glucose, asthma, and severe obesity with a BMI of over 40, who presents for follow-up consultation of his obstructive sleep apnea after starting AutoPap therapy.  The patient is unaccompanied today.  I evaluated him on 06/26/2020, at which time he reported difficulty initiating and maintaining sleep.   He was advised to proceed with a sleep study.  He had a home sleep test on 07/31/2020 which indicated overall mild obstructive sleep apnea with an AHI of 9/h, O2 nadir was 91%.  He was advised to consider AutoPap therapy to see if he would feel better.  His set up date was 07/17/2021 and he has a iBreeze 20A machine ? ?Today, 09/02/2021: I reviewed his AutoPap compliance data from 07/16/2021 through 08/14/2021, he only used his machine 3 times.  Pressure range of 5 to 11 cm.  Average usage for days on treatment of 2 hours.  He reports that he had a different machine first and it would not track his usage.  He got this machine as a secondary machine, replacing the first 1.  He reports difficulty sleeping with the AutoPap due to waking up in a panic.  He has noticed very initially a modest improvement in his sleep quality but does not feel he can continue to use this machine.  He has been working on weight loss and has lost a little bit of weight.  He is very motivated to work on lifestyle modification and weight loss.  He is open to exploring an oral appliance and would like to discontinue AutoPap therapy.  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  He has reduced his soda intake. ? ?Previously:  ? ?06/26/20: (He) reports sleep disturbances including difficulty initiating and maintaining sleep, worsening depression. Of note, he was hospitalized for  depression and psychosis in November 2021 and was recommended to have a follow-up outpatient with behavioral health. He reports that he has not been referred to psychiatry and has never seen a psychiatrist. He does endorse residual depression but denies any suicidal ideations. He has been off his Zyprexa. It was reduced from 15 mg to 5 mg as I understand and then he ran out of it about 2 weeks ago. He is not notified you of this. He has an appointment pending with you in March.   ?  ?I reviewed the chart office note from 05/08/2020.  I had previously evaluated him in the fall 2019 for sleep apnea concerns.  He was advised to proceed with sleep testing but he wanted to think about it and discuss it with his mother. He was contacted by the sleep lab twice by phone and a letter was sent to call back to schedule his sleep test. He did not call back. He was lost to follow-up since then.  ?His Epworth sleepiness score is 1 out of 24, fatigue severity score is 51 out of 63.  He had a tonsillectomy in 2020 under Dr. Jearld [name].  ?He has had weight fluctuation over time, compared to 2019, he has had a significant weight gain. He is working on weight loss. He is currently not working and has not had a very set sleep schedule. He goes to bed between 11 or midnight for the most  part, may not be asleep until 3 or 4 AM and does not sleep very long, he tries to get out of bed between 9 and 10. He lives with his mom. He quit smoking marijuana some 3-1/2 to 4 months ago, does not smoke cigarettes, drinks alcohol occasionally, caffeine in the form of coffee, 1 cup/day on average. They have no pets in the house. He has no nocturia. He has occasional morning headaches. He worked for [name] before. ?He reports that his father told him that he has sleep apnea but father does not have a CPAP machine. ?  ?  ?02/01/18: [name] is a 21 year old right handed male with an underlying medical history of asthma, allergic  rhinitis and obesity, who reports snoring and Difficulty with sleep initiation, some daytime tiredness reported. I reviewed your office note from 12/07/2017. ?His Epworth sleepiness score is 2 out of 24, fatigue score is 27/63. He is single, lives with his mother, works at [employer] as an Engineer, petroleum and has college classes Mondays, Wednesdays, and Fridays, 9-12 typically. He is a nonsmoker and does not utilize alcohol, drinks caffeine in the form of coffee, one cup per day on average, occasional tea, rare sodas. He has gained weight in the past couple of years. He has had very occasional mild her morning headaches, no history of migraines. He was diagnosed with asthma as a child. He has a family history of snoring but is not aware of any family history of OSA. He has had difficulty falling asleep for the past few years, maybe as long as 5 or 6 years even. Once he's asleep he is typically able to maintain sleep. He denies night to night nocturia or restless leg symptoms. He has tried melatonin and while it helps him go to sleep he has some daytime grogginess from it. He has a TV in his bedroom and typically falls asleep with his TV on. There are no pets in the household. He has no siblings. Bedtime may be around 11 or midnight, rise time varies depending on his work and class schedule. When he has to get up early for an appointment he feels tired. ? ?His Past Medical History Is Significant For: ?Past Medical History:  ?Diagnosis Date  ? Allergic rhinitis   ? Chronic tonsillar hypertrophy   ? ENT did tonsillectomy 2020  ? Elevated blood pressure reading without diagnosis of hypertension   ? IFG (impaired fasting glucose) 12/2017  ? Gluc 107, HbA1c 5.5%  ? Obesity, Class II, BMI 35-39.9   ? OSA (obstructive sleep apnea) 02/2018  ? Sleep study recommended by Dr. Ivory [name] considering it.  ? Severe persistent asthma   ? Dr. Sharyn [name]  ? ? ?His Past Surgical History Is Significant For: ?Past Surgical  History:  ?Procedure Laterality Date  ? TONSILLECTOMY  06/03/2018  ? ? ?His Family History Is Significant For: ?Family History  ?Problem Relation Age of Onset  ? Heart attack Other   ? Prostate cancer Other   ? Hypertension Father   ? Hypertension Maternal Grandfather   ? Cancer Maternal Grandfather   ? Asthma Neg Hx   ? ? ?His Social History Is Significant For: ?Social History  ? ?Socioeconomic History  ? Marital status: Single  ?  Spouse name: Not on file  ? Number of children: Not on file  ? Years of education: Not on file  ? Highest education level: Not on file  ?Occupational History  ?  Not on file  ?Tobacco Use  ? Smoking status: Never  ? Smokeless tobacco: Never  ?Vaping Use  ? Vaping Use: Never used  ?Substance and Sexual Activity  ? Alcohol use: No  ? Drug use: No  ? Sexual activity: Not on file  ?Other Topics Concern  ? Not on file  ?Social History Narrative  ? Single, lives with mom in [place].  ? Educ: HS  ? 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  ? No T/A/Ds.  ? ?Social Determinants of Health  ? ?Financial Resource Strain: Not on file  ?Food Insecurity: Not on file  ?Transportation Needs: Not on file  ?Physical Activity: Not on file  ?Stress: Not on file  ?Social Connections: Not on file  ? ? ?His Allergies Are:  ?Allergies  ?Allergen Reactions  ? Amoxicillin Shortness Of Breath, Swelling and Other (See Comments)  ?:  ? ?His Current Medications Are:  ?Outpatient Encounter Medications as of 09/02/2021  ?Medication Sig  ? albuterol (VENTOLIN HFA) 108 (90 Base) MCG/ACT inhaler CAN INHALE TWO PUFFS EVERY FOUR TO SIX HOURS AS NEEDED FOR COUGH OR WHEEZE.  ? Budeson-Glycopyrrol-Formoterol (BREZTRI AEROSPHERE) 160-9-4.8 MCG/ACT AERO Inhale 2 puffs into the lungs 2 (two) times daily. (Patient taking differently: Inhale 2 puffs into the lungs daily.)  ? Cholecalciferol (VITAMIN D PO) Take 2,000 Units by mouth daily.  ? metoprolol succinate (TOPROL-XL) 50 MG 24 hr tablet Take 50 mg by mouth  daily.  ? [DISCONTINUED] tretinoin (RETIN-A) 0.025 % cream 1 application to affected area in the evening to face (Patient not taking: Reported on 09/02/2021)  ? ?No facility-administered encounter medications on file as of 09/03/18

## 2021-09-03 ENCOUNTER — Encounter: Payer: Self-pay | Admitting: Neurology

## 2021-09-03 NOTE — Progress Notes (Signed)
Ross Ludwig, RN; Dimas Millin ?got it!   ? ?  ?Previous Messages ?  ?----- Message -----  ?From: Guy Begin, RN  ?Sent: 09/02/2021   4:38 PM EDT  ?To: Wilford Sports  ?Subject: discontinue autopap therapy                    ? ?See in epic new order for discontinue autopap due to intolerance.  ? ? ?Shaunte Bezio  ?Male, 21 y.o., Aug 03, 2000  ?Pronouns:  ?he/him/his  ?MRN:  ?[id number]  ? ? ?Sandy  ? ?

## 2021-10-21 ENCOUNTER — Encounter (HOSPITAL_BASED_OUTPATIENT_CLINIC_OR_DEPARTMENT_OTHER): Payer: Self-pay | Admitting: Obstetrics and Gynecology

## 2021-10-21 ENCOUNTER — Emergency Department (HOSPITAL_BASED_OUTPATIENT_CLINIC_OR_DEPARTMENT_OTHER): Payer: No Typology Code available for payment source | Admitting: Radiology

## 2021-10-21 ENCOUNTER — Emergency Department (HOSPITAL_BASED_OUTPATIENT_CLINIC_OR_DEPARTMENT_OTHER): Payer: No Typology Code available for payment source

## 2021-10-21 ENCOUNTER — Emergency Department (HOSPITAL_BASED_OUTPATIENT_CLINIC_OR_DEPARTMENT_OTHER)
Admission: EM | Admit: 2021-10-21 | Discharge: 2021-10-21 | Disposition: A | Discharge status: Home / Self Care | Payer: No Typology Code available for payment source | Attending: Emergency Medicine | Admitting: Emergency Medicine

## 2021-10-21 DIAGNOSIS — R0789 Other chest pain: Secondary | ICD-10-CM | POA: Diagnosis not present

## 2021-10-21 DIAGNOSIS — I10 Essential (primary) hypertension: Secondary | ICD-10-CM | POA: Insufficient documentation

## 2021-10-21 DIAGNOSIS — Z79899 Other long term (current) drug therapy: Secondary | ICD-10-CM | POA: Insufficient documentation

## 2021-10-21 DIAGNOSIS — M62838 Other muscle spasm: Secondary | ICD-10-CM | POA: Insufficient documentation

## 2021-10-21 DIAGNOSIS — R000 Tachycardia, unspecified: Secondary | ICD-10-CM | POA: Insufficient documentation

## 2021-10-21 DIAGNOSIS — R079 Chest pain, unspecified: Secondary | ICD-10-CM | POA: Diagnosis present

## 2021-10-21 LAB — URINALYSIS, ROUTINE W REFLEX MICROSCOPIC
Bilirubin Urine: NEGATIVE
Glucose, UA: NEGATIVE mg/dL
Hgb urine dipstick: NEGATIVE
Ketones, ur: NEGATIVE mg/dL
Leukocytes,Ua: NEGATIVE
Nitrite: NEGATIVE
Specific Gravity, Urine: 1.024 (ref 1.005–1.030)
pH: 6.5 (ref 5.0–8.0)

## 2021-10-21 LAB — CBC
HCT: 44.9 % (ref 39.0–52.0)
Hemoglobin: 14.8 g/dL (ref 13.0–17.0)
MCH: 29 pg (ref 26.0–34.0)
MCHC: 33 g/dL (ref 30.0–36.0)
MCV: 87.9 fL (ref 80.0–100.0)
Platelets: 314 10*3/uL (ref 150–400)
RBC: 5.11 MIL/uL (ref 4.22–5.81)
RDW: 12.3 % (ref 11.5–15.5)
WBC: 12.1 10*3/uL — ABNORMAL HIGH (ref 4.0–10.5)
nRBC: 0 % (ref 0.0–0.2)

## 2021-10-21 LAB — TROPONIN I (HIGH SENSITIVITY)
Troponin I (High Sensitivity): 2 ng/L (ref <18)
Troponin I (High Sensitivity): 2 ng/L (ref <18)

## 2021-10-21 LAB — D-DIMER, QUANTITATIVE: D-Dimer, Quant: 0.56 ug/mL-FEU — ABNORMAL HIGH (ref 0.00–0.50)

## 2021-10-21 LAB — BASIC METABOLIC PANEL
Anion gap: 9 (ref 5–15)
BUN: 10 mg/dL (ref 6–20)
CO2: 26 mmol/L (ref 22–32)
Calcium: 9.9 mg/dL (ref 8.9–10.3)
Chloride: 106 mmol/L (ref 98–111)
Creatinine, Ser: 0.94 mg/dL (ref 0.61–1.24)
GFR, Estimated: >60 mL/min (ref >60)
Glucose, Bld: 110 mg/dL — ABNORMAL HIGH (ref 70–99)
Potassium: 3.8 mmol/L (ref 3.5–5.1)
Sodium: 141 mmol/L (ref 135–145)

## 2021-10-21 LAB — SEDIMENTATION RATE: Sed Rate: 11 mm/hr (ref 0–16)

## 2021-10-21 MED ORDER — MORPHINE SULFATE (PF) 4 MG/ML IV SOLN
4.0000 mg | Freq: Once | INTRAVENOUS | Status: AC
  Administered 2021-10-21: 4 mg via INTRAVENOUS
  Filled 2021-10-21: qty 1

## 2021-10-21 MED ORDER — KETOROLAC TROMETHAMINE 10 MG PO TABS: 10.0000 mg | ORAL_TABLET | Freq: Four times a day (QID) | ORAL | 0 refills | Status: DC | PRN

## 2021-10-21 MED ORDER — IOHEXOL 350 MG/ML SOLN
100.0000 mL | Freq: Once | INTRAVENOUS | Status: AC | PRN
  Administered 2021-10-21: 80 mL via INTRAVENOUS

## 2021-10-21 MED ORDER — CYCLOBENZAPRINE HCL 5 MG PO TABS
5.0000 mg | ORAL_TABLET | Freq: Once | ORAL | Status: AC
  Administered 2021-10-21: 5 mg via ORAL
  Filled 2021-10-21: qty 1

## 2021-10-21 MED ORDER — CYCLOBENZAPRINE HCL 10 MG PO TABS: 10.0000 mg | ORAL_TABLET | Freq: Two times a day (BID) | ORAL | 0 refills | Status: DC | PRN

## 2021-10-21 MED ORDER — ONDANSETRON HCL 4 MG/2ML IJ SOLN
4.0000 mg | Freq: Once | INTRAMUSCULAR | Status: AC
  Administered 2021-10-21: 4 mg via INTRAVENOUS
  Filled 2021-10-21: qty 2

## 2021-10-21 MED ORDER — KETOROLAC TROMETHAMINE 15 MG/ML IJ SOLN
15.0000 mg | Freq: Once | INTRAMUSCULAR | Status: AC
  Administered 2021-10-21: 15 mg via INTRAVENOUS
  Filled 2021-10-21: qty 1

## 2021-10-21 NOTE — ED Provider Notes (Signed)
[facility] DEPT Provider Note   CSN: [id number] Arrival date & time: 10/21/21  1543     History  Chief Complaint  Patient presents with   Chest Pain    Nathaniel [name] is a 21 y.o. male with history of hypertension on metoprolol who presents to the ED for evaluation of numerous complaints.  Particularly, patient is complaining of chest pain and tightness radiating into his back.  He states that for several weeks, he has been having intermittent cramping discomfort of his bilateral lower extremities and is now having pain in the palms of his hands.  Also endorses intermittent abdominal pain, some nausea and loose stools today.[name]  He was managing this at home with over-the-counter medications, however he then developed chest pain and tightness today so came to the emergency department.  He notes that he just does not feel "right".  He is having mild shortness of breath.  He denies fevers, chills.  Patient also notes that he has had a pimple on his lower back for about 1.5 years that occasionally seems to drain pus.  He also states that he developed cysts on his lower abdomen fairly frequently.   Chest Pain      Home Medications Prior to Admission medications   Medication Sig Start Date End Date Taking? Authorizing Provider  cyclobenzaprine (FLEXERIL) 10 MG tablet Take 1 tablet (10 mg total) by mouth 2 (two) times daily as needed for muscle spasms. 10/21/21  Yes Kathe Becton R, PA-C  ketorolac (TORADOL) 10 MG tablet Take 1 tablet (10 mg total) by mouth every 6 (six) hours as needed. 10/21/21  Yes Kathe Becton R, PA-C  albuterol (VENTOLIN HFA) 108 (90 Base) MCG/ACT inhaler CAN INHALE TWO PUFFS EVERY FOUR TO SIX HOURS AS NEEDED FOR COUGH OR WHEEZE. 07/28/21   Kozlow, Donnamarie Poag, MD  Budeson-Glycopyrrol-Formoterol (BREZTRI AEROSPHERE) 160-9-4.8 MCG/ACT AERO Inhale 2 puffs into the lungs 2 (two) times daily. Patient taking differently: Inhale 2 puffs into the lungs daily.  08/07/21   Valentina Shaggy, MD  Cholecalciferol (VITAMIN D PO) Take 2,000 Units by mouth daily.    [provider]  metoprolol succinate (TOPROL-XL) 50 MG 24 hr tablet Take 50 mg by mouth daily. 07/28/21   [provider]      Allergies    Amoxicillin    Review of Systems   Review of Systems  Cardiovascular:  Positive for chest pain.    Physical Exam Updated Vital Signs BP 133/82   Pulse 86   Temp 98.3 F (36.8 C)   Resp 16   Ht _0  (1.854 m)   Wt 136.1 kg   SpO2 100%   BMI 39.58 kg/m  Physical Exam Vitals and nursing note reviewed.  Constitutional:      General: He is not in acute distress.    Appearance: He is ill-appearing.  HENT:     Head: Atraumatic.  Eyes:     Conjunctiva/sclera: Conjunctivae normal.  Cardiovascular:     Rate and Rhythm: Regular rhythm. Tachycardia present.     Pulses: Normal pulses.          Carotid pulses are 2+ on the right side and 2+ on the left side.      Dorsalis pedis pulses are 2+ on the right side and 2+ on the left side.     Heart sounds: No murmur heard. Pulmonary:     Effort: Pulmonary effort is normal. No respiratory distress.     Breath sounds: Normal  breath sounds.  Abdominal:     General: Abdomen is flat. There is no distension.     Palpations: Abdomen is soft.     Tenderness: There is no abdominal tenderness.  Musculoskeletal:        General: Normal range of motion.     Cervical back: Normal range of motion.     Comments: No bilateral lower extremity edema, no reproducible posterior calf tenderness  Skin:    General: Skin is warm and dry.     Capillary Refill: Capillary refill takes less than 2 seconds.     Comments: pilonidal cyst at the gluteal cleft that is not infected and appears recently drained.  No redness, erythema or fluctuance.  2 cysts of the lower abdomen that are nonerythemetous, nontender   Neurological:     General: No focal deficit present.     Mental Status: He is alert.   Psychiatric:        Mood and Affect: Mood normal.     ED Results / Procedures / Treatments   Labs (all labs ordered are listed, but only abnormal results are displayed) Labs Reviewed  BASIC METABOLIC PANEL - Abnormal; Notable for the following components:      Result Value   Glucose, Bld 110 (*)    All other components within normal limits  CBC - Abnormal; Notable for the following components:   WBC 12.1 (*)    All other components within normal limits  URINALYSIS, ROUTINE W REFLEX MICROSCOPIC - Abnormal; Notable for the following components:   Protein, ur TRACE (*)    All other components within normal limits  D-DIMER, QUANTITATIVE - Abnormal; Notable for the following components:   D-Dimer, Quant 0.56 (*)    All other components within normal limits  SEDIMENTATION RATE  C-REACTIVE PROTEIN  TROPONIN I (HIGH SENSITIVITY)  TROPONIN I (HIGH SENSITIVITY)    EKG EKG Interpretation  Date/Time:  Tuesday October 21 2021 15:49:43 EDT Ventricular Rate:  117 PR Interval:  134 QRS Duration: 74 QT Interval:  312 QTC Calculation: 435 R Axis:   98 Text Interpretation: Sinus tachycardia Rightward axis Nonspecific ST abnormality Abnormal ECG When compared with ECG of 14-Nov-2011 22:36, PREVIOUS ECG IS PRESENT when compared to prior, abnormal Q waves in 1 and AVL as well as t wave inversions in leads 1 and AVL. no STEMI Confirmed by Antony Blackbird [phone]) on 10/21/2021 4:14:14 PM  Radiology CT Angio Chest PE W and/or Wo Contrast  Result Date: 10/21/2021 CLINICAL DATA:  Chest pain and dizziness with leg pain and cramps. EXAM: CT ANGIOGRAPHY CHEST WITH CONTRAST TECHNIQUE: Multidetector CT imaging of the chest was performed using the standard protocol during bolus administration of intravenous contrast. Multiplanar CT image reconstructions and MIPs were obtained to evaluate the vascular anatomy. RADIATION DOSE REDUCTION: This exam was performed according to the departmental dose-optimization  program which includes automated exposure control, adjustment of the mA and/or kV according to patient size and/or use of iterative reconstruction technique. CONTRAST:  63m OMNIPAQUE IOHEXOL 350 MG/ML SOLN COMPARISON:  None Available. FINDINGS: Cardiovascular: Satisfactory opacification of the pulmonary arteries to the segmental level. No evidence of pulmonary embolism. Normal heart size. No pericardial effusion. Mediastinum/Nodes: No enlarged mediastinal, hilar, or axillary lymph nodes. Thyroid gland, trachea, and esophagus demonstrate no significant findings. Lungs/Pleura: Lungs are clear. No pleural effusion or pneumothorax. Upper Abdomen: No acute abnormality. Musculoskeletal: No chest wall abnormality. No acute or significant osseous findings. Review of the MIP images confirms the above findings. IMPRESSION:  No CT evidence of pulmonary embolism or other acute cardiopulmonary disease. Electronically Signed   By: Virgina Norfolk M.D.   On: 10/21/2021 18:40   DG Chest 2 View  Result Date: 10/21/2021 CLINICAL DATA:  chest pain, dizziness and shortness of breath EXAM: CHEST - 2 VIEW COMPARISON:  April 18, 2016 FINDINGS: The heart size and mediastinal contours are within normal limits. Both lungs are clear. The visualized skeletal structures are unremarkable. IMPRESSION: No active cardiopulmonary disease. Electronically Signed   By: Frazier Richards M.D.   On: 10/21/2021 17:23    Procedures Procedures    Medications Ordered in ED Medications  morphine (PF) 4 MG/ML injection 4 mg (4 mg Intravenous Given 10/21/21 1702)  ondansetron (ZOFRAN) injection 4 mg (4 mg Intravenous Given 10/21/21 1700)  iohexol (OMNIPAQUE) 350 MG/ML injection 100 mL (80 mLs Intravenous Contrast Given 10/21/21 1818)  ketorolac (TORADOL) 15 MG/ML injection 15 mg (15 mg Intravenous Given 10/21/21 2022)  cyclobenzaprine (FLEXERIL) tablet 5 mg (5 mg Oral Given 10/21/21 2022)    ED Course/ Medical Decision Making/ A&P                            Medical Decision Making Amount and/or Complexity of Data Reviewed Labs: ordered. Radiology: ordered.  Risk Prescription drug management.   Social determinants of health:  Social History   Socioeconomic History   Marital status: Single    Spouse name: Not on file   Number of children: Not on file   Years of education: Not on file   Highest education level: Not on file  Occupational History   Not on file  Tobacco Use   Smoking status: Never    Passive exposure: Never   Smokeless tobacco: Never  Vaping Use   Vaping Use: Never used  Substance and Sexual Activity   Alcohol use: No   Drug use: No   Sexual activity: Yes  Other Topics Concern   Not on file  Social History Narrative   Single, lives with mom in [place].   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   No T/A/Ds.   Social Determinants of Health   Financial Resource Strain: Not on file  Food Insecurity: Not on file  Transportation Needs: Not on file  Physical Activity: Not on file  Stress: Not on file  Social Connections: Not on file  Intimate Partner Violence: Not on file     Initial impression:  This patient presents to the ED for concern of chest pain and diffuse body aches, this involves an extensive number of treatment options, and is a complaint that carries with it a high risk of complications and morbidity.   Differentials include ACS, PE, pneumonia, rhabdomyolysis, viral infection, sepsis.   Comorbidities affecting care:  hyeprtension  Additional history obtained: Previous family med visits  Lab Tests  I Ordered, reviewed, and interpreted labs and EKG.  The pertinent results include:  BMP, UA and troponin normal Leukocytosis 12.1 Elevated D-dimer at 0.56  Imaging Studies ordered:  I ordered imaging studies including  Chest x-ray unremarkable CTA negative for PE I independently visualized and interpreted imaging and I agree with the  radiologist interpretation.   EKG: EKG shows sinus tachycardia with abnormal Q waves and T wave inversions  Cardiac Monitoring:  The patient was maintained on a cardiac monitor.  I personally viewed and interpreted the cardiac monitored which showed an underlying rhythm of: Sinus  tachycardia   Medicines ordered and prescription drug management:  I ordered medication including: Morphine 4 mg Zofran 4 Reevaluation of the patient after these medicines showed that the patient improved I have reviewed the patients home medicines and have made adjustments as needed    Consultations Obtained:  I requested consultation with cardiology and spoke with Dr. Dennison Bulla,  and discussed lab and imaging findings as well as pertinent plan - they recommend: Obtaining ESR and CRP.  If this is elevated, then patient may have atypical, uncomplicated pericarditis and can be treated with ibuprofen and colchicine with cardiac follow-up.  If it is negative, then he can still follow-up with cardiology if he wishes but concern for cardiac etiology at that point is low.   ED Course/Re-evaluation: 21 year old male presents to the ED for evaluation of chest pain/tightness and muscle cramps in his legs and arms.  Patient was tachycardic here in the emergency department but vitals were otherwise unremarkable.  On exam, patient has a recently ruptured pilonidal cyst over his right gluteal cleft that does not appear infected.  Bilateral lower legs are without edema or tenderness palpation.  Patient states he has some relief when I was pressing on his calf muscles bilaterally.  Lungs CTA bilaterally.  Heart sounds normal.  Delta troponin was normal and patient has mildly elevated leukocytosis.  This is noted on previous labs for him as well.  His D-dimer was slightly elevated so CTA was obtained without acute findings.  Patient was initially given morphine and Zofran although has no relief of symptoms.  I spoke with  cardiology with details as described above.  ESR was negative and CRP will likely not result tomorrow.  Given this, unlikely experiencing pericarditis.  He was then given Toradol and Flexeril and upon reevaluation he said he had very minor improvement was still experiencing symptoms.  At this point, there does not appear to be an emergent cause of patient's symptoms.  Discharged home with Flexeril, Toradol and advised to establish PCP follow-up for additional evaluation as needed.  Patient expresses understanding and is amenable to plan.  Disposition:  After consideration of the diagnostic results, physical exam, history and the patients response to treatment feel that the patent would benefit from discharge.   Atypical chest pain Muscle spasms: Plan and management as described above. Discharged home in good condition.   Final Clinical Impression(s) / ED Diagnoses Final diagnoses:  Atypical chest pain  Muscle spasm    Rx / DC Orders ED Discharge Orders          Ordered    cyclobenzaprine (FLEXERIL) 10 MG tablet  2 times daily PRN        10/21/21 2232    ketorolac (TORADOL) 10 MG tablet  Every 6 hours PRN        10/21/21 2232              Rodena Piety 10/21/21 2237    Tegeler, Gwenyth Allegra, MD 10/22/21 [phone]

## 2021-10-21 NOTE — ED Notes (Signed)
Patient transported to X-ray 

## 2021-10-21 NOTE — Discharge Instructions (Signed)
Fortunately, your work-up today was overall reassuring and we know you are not having a blood clot, heart attack, pericarditis or any other emergent cause of your pain.  Unfortunately, I do not have an answer for what is causing your symptoms.  I have sent you in a prescription for a muscle relaxer along with a anti-inflammatory medication.  I recommend following up with your PCP for additional evaluation and testing.  I hope you feel better soon.

## 2021-10-21 NOTE — ED Triage Notes (Signed)
Patient reports to the ER for chest pain, dizziness, a pimple he is concerned about, leg pain and cramps, stomach pain, and other reported physical symptoms.

## 2021-10-21 NOTE — ED Notes (Addendum)
Pt states that the medication was initially effective. Pt calmly complains of generalized body aches that continue. Pt did add that chest pain has eased off and only feels like pressure now.

## 2021-10-21 NOTE — ED Notes (Signed)
Patient transported to CT 

## 2021-10-22 LAB — C-REACTIVE PROTEIN: CRP: 1.2 mg/dL — ABNORMAL HIGH (ref <1.0)

## 2021-10-28 ENCOUNTER — Ambulatory Visit: Payer: No Typology Code available for payment source | Admitting: Neurology
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
## 2021-11-03 ENCOUNTER — Ambulatory Visit (INDEPENDENT_AMBULATORY_CARE_PROVIDER_SITE_OTHER): Payer: No Typology Code available for payment source | Admitting: Psychiatry

## 2021-11-03 ENCOUNTER — Encounter (HOSPITAL_COMMUNITY): Payer: Self-pay | Admitting: Psychiatry

## 2021-11-03 DIAGNOSIS — F250 Schizoaffective disorder, bipolar type: Secondary | ICD-10-CM | POA: Diagnosis not present

## 2021-11-03 MED ORDER — RISPERIDONE 1 MG PO TABS: 1.0000 mg | ORAL_TABLET | Freq: Two times a day (BID) | ORAL | 3 refills | Status: DC

## 2021-11-03 MED ORDER — PALIPERIDONE PALMITATE ER 156 MG/ML IM SUSY
156.0000 mg | PREFILLED_SYRINGE | Freq: Once | INTRAMUSCULAR | Status: AC
  Administered 2021-12-04: 156 mg via INTRAMUSCULAR

## 2021-11-03 MED ORDER — INVEGA SUSTENNA 156 MG/ML IM SUSY: 156.0000 mg | PREFILLED_SYRINGE | INTRAMUSCULAR | 11 refills | Status: DC
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
## 2021-11-10 ENCOUNTER — Ambulatory Visit (INDEPENDENT_AMBULATORY_CARE_PROVIDER_SITE_OTHER): Payer: No Typology Code available for payment source | Admitting: Licensed Clinical Social Worker

## 2021-11-10 DIAGNOSIS — F250 Schizoaffective disorder, bipolar type: Secondary | ICD-10-CM
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
## 2021-12-04 ENCOUNTER — Ambulatory Visit (INDEPENDENT_AMBULATORY_CARE_PROVIDER_SITE_OTHER): Payer: No Typology Code available for payment source | Admitting: *Deleted

## 2021-12-04 ENCOUNTER — Encounter (HOSPITAL_COMMUNITY): Payer: Self-pay

## 2021-12-04 VITALS — BP 142/65 | HR 87 | Ht 73.0 in | Wt 314.0 lb

## 2021-12-04 DIAGNOSIS — F250 Schizoaffective disorder, bipolar type: Secondary | ICD-10-CM | POA: Diagnosis not present

## 2021-12-04 NOTE — Progress Notes (Signed)
In for his first injection of Invega S 156 mg here at this clinic. He was seen by Toy Cookey NP last month for first visit after getting discharged from [facility]. He has a therapist thru [place]. He is pleasant, cooperative and appropriate. He states he is here for schizophrenia and states he feels he is on the right track after getting his first Invega shot at the hospital. Denies current psychotic sx. He is a Consulting civil engineer. His mom is with him but she stayed in the lobby and Clinical research associate spoke with her once finished the shot. Patient was given a sample of Roney Mans today, he has private insurance thru his mom called [employer]. She states the medicine is 1400 and she cant afford it, he is not eligible for patient assistance as he is insured. I am uncertain of how to go forward with his care here as we only see [facility] and uninsured [employer]. Will reach out to my supervisor for assist and contact mom when I have a plan for him going forward. He got his shot today in his R DELTOID without issue. He is due back in 28 days

## 2021-12-09 ENCOUNTER — Ambulatory Visit (HOSPITAL_COMMUNITY): Payer: No Typology Code available for payment source | Admitting: Licensed Clinical Social Worker

## 2022-01-01 ENCOUNTER — Ambulatory Visit (INDEPENDENT_AMBULATORY_CARE_PROVIDER_SITE_OTHER): Payer: No Typology Code available for payment source

## 2022-01-01 ENCOUNTER — Encounter (HOSPITAL_COMMUNITY): Payer: Self-pay

## 2022-01-01 VITALS — BP 160/83 | HR 93 | Ht 73.0 in | Wt 312.0 lb

## 2022-01-01 DIAGNOSIS — F250 Schizoaffective disorder, bipolar type: Secondary | ICD-10-CM

## 2022-01-01 MED ORDER — PALIPERIDONE PALMITATE ER 156 MG/ML IM SUSY
156.0000 mg | PREFILLED_SYRINGE | Freq: Once | INTRAMUSCULAR | Status: AC
  Administered 2022-01-01: 156 mg via INTRAMUSCULAR

## 2022-01-01 NOTE — Progress Notes (Signed)
PATIENT PRESENTS  TO THE OFFICE FOR INVEGA SUSTENNA INJECTION 156 MG/ML IN HIS LEFT DELTOID. GIVEN BY Jaaziah Schulke CMA.[name] PATIENT TOLERATED WELL ,WILL RETURN IN 28 DAYS

## 2022-01-21 ENCOUNTER — Encounter (HOSPITAL_COMMUNITY): Payer: Self-pay | Admitting: Psychiatry

## 2022-01-21 ENCOUNTER — Telehealth (INDEPENDENT_AMBULATORY_CARE_PROVIDER_SITE_OTHER): Payer: No Typology Code available for payment source | Admitting: Psychiatry

## 2022-01-21 DIAGNOSIS — F250 Schizoaffective disorder, bipolar type: Secondary | ICD-10-CM | POA: Diagnosis not present

## 2022-01-21 MED ORDER — INVEGA SUSTENNA 156 MG/ML IM SUSY: 156.0000 mg | PREFILLED_SYRINGE | INTRAMUSCULAR | 11 refills | Status: DC

## 2022-01-21 MED ORDER — BENZTROPINE MESYLATE 1 MG PO TABS: 1.0000 mg | ORAL_TABLET | Freq: Two times a day (BID) | ORAL | 3 refills | Status: DC

## 2022-01-21 MED ORDER — RISPERIDONE 1 MG PO TABS: 1.0000 mg | ORAL_TABLET | Freq: Two times a day (BID) | ORAL | 3 refills | Status: DC

## 2022-01-21 NOTE — Progress Notes (Signed)
BH MD/PA/NP OP Progress Note Virtual Visit via Video Note  I connected with Nathaniel [name] on 01/21/22 at  9:00 AM EDT by a video enabled telemedicine application and verified that I am speaking with the correct person using two identifiers.  Location: Patient: Home Provider: Clinic   I discussed the limitations of evaluation and management by telemedicine and the availability of in person appointments. The patient expressed understanding and agreed to proceed.  I provided 30 minutes of non-face-to-face time during this encounter.   01/21/2022 9:39 AM Nathaniel [name]  MRN:  [id number]  Chief Complaint: " I feel a little paranoid at times"  HPI:  21 year old male seen today for follow-up psychiatric evaluation. He has a psychiatric history of substance use, tobacco dependence, schizoaffective disorder bipolar type, and anxiety.  He is currently managed on Risperdal 1 mg twice daily and Invega Sustenna 156 mg injections. He informed Clinical research associatewriter that his medications are somewhat effective in managing his psychiatric conditions.  Today he is well-groomed, pleasant, cooperative, engaged in conversation, maintaining eye contact.  He informed Clinical research associatewriter that at times he feels a little paranoid.  He notes that he also has visual hallucinations noting that he sees clear and dark spots.  Patient reports that he would like an increase in his medications.  Provider discussed the risk of being on 2 antipsychotics.  Provider asked patient if he had experienced any symptoms of TD.  He notes that at times he has abnormal facial twitches.  Provider recommended that Risperdal not be increased at this time but instead starting Cogentin to help manage muscle movements.  He endorsed understanding and agreed.  An aims assessment was unable to be conducted as patient camera was blurry.  He will be seen in shot clinic and assessed at that time.  At times patient informed writer that he is anxious about his future.  He notes that he  would like to return to school and work but finds it difficult with his current mental state.  Provider conducted a GAD-7 and patient scored a 10, at his last visit he scored a 5.  Provider also conducted PHQ-9 and patient scored a 12, at his last visit he scored an 8.  Today he endorses adequate sleep and appetite.  He endorses passive SI but denies wanting to harm himself.  He denies SI/HI/AH or mania.    Today patient agreeable to starting Cogentin 1 mg twice daily to help manage TD. Potential side effects of medication and risks vs benefits of treatment vs non-treatment were explained and discussed. All questions were answered. He will continue all other medications as prescribed.  No other concerns at this time.  Visit Diagnosis:    ICD-10-CM   1. Schizoaffective disorder, bipolar type (HCC)  F25.0 benztropine (COGENTIN) 1 MG tablet    risperiDONE (RISPERDAL) 1 MG tablet    paliperidone (INVEGA SUSTENNA) 156 MG/ML SUSY injection      Past Psychiatric History:  substance use, tobacco dependence, schizoaffective disorder bipolar type, and anxiety  Past Medical History:  Past Medical History:  Diagnosis Date   Allergic rhinitis    Chronic tonsillar hypertrophy    ENT did tonsillectomy 2020   Elevated blood pressure reading without diagnosis of hypertension    IFG (impaired fasting glucose) 12/2017   Gluc 107, HbA1c 5.5%   Obesity, Class II, BMI 35-39.9    OSA (obstructive sleep apnea) 02/2018   Sleep study recommended by Dr. Ivory BroadAthar;pt considering it.   Severe persistent asthma  Dr. Sharyn Lull    Past Surgical History:  Procedure Laterality Date   TONSILLECTOMY  06/03/2018    Family Psychiatric History:  Denies  Family History:  Family History  Problem Relation Age of Onset   Heart attack Other    Prostate cancer Other    Hypertension Father    Hypertension Maternal Grandfather    Cancer Maternal Grandfather    Asthma Neg Hx     Social History:  Social History    Socioeconomic History   Marital status: Single    Spouse name: Not on file   Number of children: Not on file   Years of education: Not on file   Highest education level: Not on file  Occupational History   Not on file  Tobacco Use   Smoking status: Never    Passive exposure: Never   Smokeless tobacco: Never  Vaping Use   Vaping Use: Never used  Substance and Sexual Activity   Alcohol use: No   Drug use: No   Sexual activity: Yes  Other Topics Concern   Not on file  Social History Narrative   Single, lives with mom in [place].   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   No T/A/Ds.   Social Determinants of Health   Financial Resource Strain: Low Risk  (11/10/2021)   Overall Financial Resource Strain (CARDIA)    Difficulty of Paying Living Expenses: Not hard at all  Food Insecurity: No Food Insecurity (11/10/2021)   Hunger Vital Sign    Worried About Running Out of Food in the Last Year: Never true    Ran Out of Food in the Last Year: Never true  Transportation Needs: No Transportation Needs (11/10/2021)   PRAPARE - Administrator, Civil Service (Medical): No    Lack of Transportation (Non-Medical): No  Physical Activity: Sufficiently Active (11/10/2021)   Exercise Vital Sign    Days of Exercise per Week: 6 days    Minutes of Exercise per Session: 30 min  Stress: Stress Concern Present (11/10/2021)   [employer] of Occupational Health - Occupational Stress Questionnaire    Feeling of Stress : Very much  Social Connections: Socially Isolated (11/10/2021)   Social Connection and Isolation Panel [NHANES]    Frequency of Communication with Friends and Family: More than three times a week    Frequency of Social Gatherings with Friends and Family: More than three times a week    Attends Religious Services: Never    Database administrator or Organizations: No    Attends Banker Meetings: Never    Marital Status:  Never married    Allergies:  Allergies  Allergen Reactions   Amoxicillin Shortness Of Breath, Swelling and Other (See Comments)    Metabolic Disorder Labs: Lab Results  Component Value Date   HGBA1C 5.5 03/14/2020   MPG 111.15 03/14/2020   MPG 114 08/27/2014   No results found for: "PROLACTIN" Lab Results  Component Value Date   CHOL 180 03/14/2020   TRIG 136 03/14/2020   HDL 33 (L) 03/14/2020   CHOLHDL 5.5 03/14/2020   VLDL 27 03/14/2020   LDLCALC 120 (H) 03/14/2020   LDLCALC 92 02/28/2019   Lab Results  Component Value Date   TSH 2.519 03/15/2020   TSH 5.616 (H) 03/14/2020    Therapeutic Level Labs: No results found for: "LITHIUM" No results found for: "VALPROATE" No results found for: "CBMZ"  Current Medications: Current Outpatient  Medications  Medication Sig Dispense Refill   benztropine (COGENTIN) 1 MG tablet Take 1 tablet (1 mg total) by mouth 2 (two) times daily. 60 tablet 3   albuterol (VENTOLIN HFA) 108 (90 Base) MCG/ACT inhaler CAN INHALE TWO PUFFS EVERY FOUR TO SIX HOURS AS NEEDED FOR COUGH OR WHEEZE. 8.5 each 1   Budeson-Glycopyrrol-Formoterol (BREZTRI AEROSPHERE) 160-9-4.8 MCG/ACT AERO Inhale 2 puffs into the lungs 2 (two) times daily. (Patient taking differently: Inhale 2 puffs into the lungs daily.) 10.7 g 5   Cholecalciferol (VITAMIN D PO) Take 2,000 Units by mouth daily.     cyclobenzaprine (FLEXERIL) 10 MG tablet Take 1 tablet (10 mg total) by mouth 2 (two) times daily as needed for muscle spasms. 30 tablet 0   ketorolac (TORADOL) 10 MG tablet Take 1 tablet (10 mg total) by mouth every 6 (six) hours as needed. 30 tablet 0   metoprolol succinate (TOPROL-XL) 50 MG 24 hr tablet Take 50 mg by mouth daily.     paliperidone (INVEGA SUSTENNA) 156 MG/ML SUSY injection Inject 1 mL (156 mg total) into the muscle every 30 (thirty) days. 1 mL 11   risperiDONE (RISPERDAL) 1 MG tablet Take 1 tablet (1 mg total) by mouth 2 (two) times daily. 60 tablet 3   No  current facility-administered medications for this visit.     Musculoskeletal: Strength & Muscle Tone: within normal limits and Telehealth visit Gait & Station: normal Patient leans: N/A  Psychiatric Specialty Exam: Review of Systems  There were no vitals taken for this visit.There is no height or weight on file to calculate BMI.  General Appearance: Well Groomed  Eye Contact:  Good  Speech:  Clear and Coherent and Normal Rate  Volume:  Normal  Mood:  Euthymic  Affect:  Appropriate and Congruent  Thought Process:  Coherent, Goal Directed, and Linear  Orientation:  Full (Time, Place, and Person)  Thought Content: Logical, Hallucinations: Visual, and Paranoid Ideation   Suicidal Thoughts:  Yes.  without intent/plan  Homicidal Thoughts:  No  Memory:  Immediate;   Good Recent;   Good Remote;   Good  Judgement:  Good  Insight:  Good  Psychomotor Activity:  Normal and reports abnormal facial twitching   Concentration:  Concentration: Good and Attention Span: Good  Recall:  Good  Fund of Knowledge: Good  Language: Good  Akathisia:  No  Handed:  Right  AIMS (if indicated): not done  Assets:  Communication Skills Desire for Improvement Financial Resources/Insurance Housing Physical Health Social Support  ADL's:  Intact  Cognition: WNL  Sleep:  Good   Screenings: AIMS    Flowsheet Row Ad[facility] (Dis[facility]) [facility]  AIMS Total Score 0      AUDIT    Flowsheet Row Admission (Discharged) from [facility]  Alcohol Use Disorder Identification Test Final Score (AUDIT) 1      GAD-7    Flowsheet Row Video Visit from 01/21/2022 in [facility] Office Visit from 11/03/2021 in [facility]  Total GAD-7 Score 10 5      PHQ2-9    Flowsheet Row Video Visit from 01/21/2022 in [facility] Counselor from 11/10/2021 in [facility] Office Visit from 11/03/2021 in [facility] Office Visit from 05/22/2019 in [facility] Office Visit from 12/07/2017 in  [facility] Total Score 2 0 0 0 0  PHQ-9 Total Score 12 8 6  -- --      Flowsheet Row Video Visit from 01/21/2022 in [facility] Counselor from 11/10/2021 in [facility] Office Visit from 11/03/2021 in [facility] Error: Q7 should not be populated when Q6 is No No Risk No Risk        Assessment and Plan: Patient endorses symptoms of TD.  An aims assessment was unable to be conducted as patient's camera was blurry today.  He will be assessed at shot clinic during his next visit.  Today he is agreeable to starting Cogentin 1 mg twice daily to help manage symptoms of TD.  He will continue other medications as prescribed.  1. Schizoaffective disorder, bipolar type ([place])  Start- benztropine (COGENTIN) 1 MG tablet; Take 1 tablet (1 mg total) by mouth 2 (two) times daily.  Dispense: 60 tablet; Refill: 3 Continue- risperiDONE (RISPERDAL) 1 MG tablet; Take 1 tablet (1 mg total) by mouth 2 (two) times daily.  Dispense: 60 tablet; Refill: 3 Continue- paliperidone (INVEGA SUSTENNA) 156 MG/ML SUSY injection; Inject 1 mL (156 mg total) into the muscle every 30 (thirty) days.  Dispense: 1 mL; Refill: 11   Collaboration of Care: Collaboration of Care: Other provider involved in patient's care AEB counselor  Patient/Guardian was advised Release of Information must be obtained prior to any record release in order to collaborate their care with an outside provider. Patient/Guardian was advised if they have not already done so to contact the registration department to sign all necessary forms in order for [place] to release  information regarding their care.   Consent: Patient/Guardian gives verbal consent for treatment and assignment of benefits for services provided during this visit. Patient/Guardian expressed understanding and agreed to proceed.   Follow-up in 3 months Follow-up therapy Salley Slaughter, NP 01/21/2022, 9:39 AM

## 2022-01-29 ENCOUNTER — Ambulatory Visit (INDEPENDENT_AMBULATORY_CARE_PROVIDER_SITE_OTHER): Payer: No Typology Code available for payment source | Admitting: *Deleted

## 2022-01-29 VITALS — BP 136/68 | HR 89 | Ht 73.0 in | Wt 339.0 lb

## 2022-01-29 DIAGNOSIS — F250 Schizoaffective disorder, bipolar type: Secondary | ICD-10-CM | POA: Diagnosis not present

## 2022-01-29 MED ORDER — PALIPERIDONE PALMITATE ER 156 MG/ML IM SUSY
156.0000 mg | PREFILLED_SYRINGE | Freq: Once | INTRAMUSCULAR | Status: AC
  Administered 2022-01-29: 156 mg via INTRAMUSCULAR

## 2022-01-29 NOTE — Progress Notes (Signed)
In as scheduled for his monthly inj of Invega S 156 mg in his R DELTOID today. He is pleasant, appropriate and verbal. He is getting his third shot here today at this clinic. He is interested in going to the three month shot and so is his mom who brought him to this appt. Will discuss with the provider giving a [place] shot next visit. He is in school and working. No complaints voiced. He is to return in 28 days.

## 2022-01-29 NOTE — Addendum Note (Signed)
Addended by: Davina Poke on: 01/29/2022 04:24 PM   Modules accepted: Orders

## 2022-02-05 ENCOUNTER — Encounter (HOSPITAL_COMMUNITY): Payer: Self-pay | Admitting: Psychiatry

## 2022-02-05 ENCOUNTER — Telehealth (INDEPENDENT_AMBULATORY_CARE_PROVIDER_SITE_OTHER): Payer: No Typology Code available for payment source | Admitting: Psychiatry

## 2022-02-05 DIAGNOSIS — F250 Schizoaffective disorder, bipolar type: Secondary | ICD-10-CM

## 2022-02-05 DIAGNOSIS — F900 Attention-deficit hyperactivity disorder, predominantly inattentive type: Secondary | ICD-10-CM | POA: Diagnosis not present

## 2022-02-05 MED ORDER — RISPERIDONE 1 MG PO TABS: 1.0000 mg | ORAL_TABLET | Freq: Two times a day (BID) | ORAL | 3 refills | Status: DC

## 2022-02-05 MED ORDER — PALIPERIDONE PALMITATE ER 546 MG/1.75ML IM SUSY
546.0000 mg | PREFILLED_SYRINGE | INTRAMUSCULAR | Status: DC
  Administered 2022-02-26 – 2022-05-28 (×2): 546 mg via INTRAMUSCULAR

## 2022-02-05 MED ORDER — BENZTROPINE MESYLATE 1 MG PO TABS: 1.0000 mg | ORAL_TABLET | Freq: Three times a day (TID) | ORAL | 3 refills | Status: DC

## 2022-02-05 MED ORDER — INVEGA TRINZA 546 MG/1.75ML IM SUSY: 546.0000 mg | PREFILLED_SYRINGE | INTRAMUSCULAR | 12 refills | Status: DC

## 2022-02-05 MED ORDER — ATOMOXETINE HCL 40 MG PO CAPS: 40.0000 mg | ORAL_CAPSULE | Freq: Every day | ORAL | 3 refills | Status: DC

## 2022-02-05 NOTE — Progress Notes (Signed)
BH MD/PA/NP OP Progress Note Virtual Visit via Video Note  I connected with Nathaniel [name] on 02/05/22 at  3:30 PM EDT by a video enabled telemedicine application and verified that I am speaking with the correct person using two identifiers.  Location: Patient: Home Provider: Clinic   I discussed the limitations of evaluation and management by telemedicine and the availability of in person appointments. The patient expressed understanding and agreed to proceed.  I provided 30 minutes of non-face-to-face time during this encounter.   02/05/2022 4:09 PM Nathaniel [name]  MRN:  [id number]  Chief Complaint: " I have been doing okay. The cogentin has helped with the muscle movement"  HPI:  21 year old male seen today for follow-up psychiatric evaluation. He has a psychiatric history of substance use, tobacco dependence, schizoaffective disorder bipolar type, and anxiety.  He is currently managed on Risperdal 1 mg twice daily and Invega Sustenna 156 mg injections. He informed Probation officer that his medications are somewhat effective in managing his psychiatric conditions.  Today he is well-groomed, pleasant, cooperative, engaged in conversation, maintaining eye contact.  He informed Probation officer that he has been doing okay.  He notes that the Cogentin help with his muscle movements however reports that at times he still have abnormal movements.  Since his last visit he notes that his anxiety and depression are well managed.  Provider conducted a GAD-7 and patient scored a 10, at his last visit he scored a 5.  Provider also conducted PHQ-9 and patient today 16, at his last visit he scored a 12.  He reports that he is worried about his health, schooling, finding a job, and his family.  He endorses adequate sleep and fluctuations in appetite.  Today he denies SI/HI/AH or paranoia.  Patient notes at times he sees molecule like things.  He describes them as a black or clear orbs.  Patient notes that they improve at night but  notes that they are more prominent in the daytime.  Provider recommended patient being seen by an ophthalmologist as this can be an ocular issue.  He endorsed understanding and agreed.   Today patient endorses symptoms of ADHD such as distractibility, forgetfulness, poor concentration, inattentiveness to mentally taxing tasks, and disorganization.  Patient informed Probation officer that he would like to trial [place].  Provider was agreeable to this and sent medication to preferred pharmacy.  He is also agreeable to starting Strattera 40 mg to help manage symptoms of ADHD.  He will continue Risperdal as prescribed.  Lorayne Bender Sustenna discontinued. Potential side effects of medication and risks vs benefits of treatment vs non-treatment were explained and discussed. All questions were answered.   No other concerns at this time.   Visit Diagnosis:    ICD-10-CM   1. Attention deficit hyperactivity disorder (ADHD), predominantly inattentive type  F90.0 atomoxetine (STRATTERA) 40 MG capsule    2. Schizoaffective disorder, bipolar type (HCC)  F25.0 benztropine (COGENTIN) 1 MG tablet    risperiDONE (RISPERDAL) 1 MG tablet    Paliperidone Palmitate ER (INVEGA TRINZA) injection 546 mg    Paliperidone Palmitate ER (INVEGA TRINZA) 546 MG/1.75ML injection      Past Psychiatric History:  substance use, tobacco dependence, schizoaffective disorder bipolar type, and anxiety  Past Medical History:  Past Medical History:  Diagnosis Date   Allergic rhinitis    Chronic tonsillar hypertrophy    ENT did tonsillectomy 2020   Elevated blood pressure reading without diagnosis of hypertension    IFG (impaired fasting glucose) 12/2017  Gluc 107, HbA1c 5.5%   Obesity, Class II, BMI 35-39.9    OSA (obstructive sleep apnea) 02/2018   Sleep study recommended by Dr. Ivory Broad considering it.   Severe persistent asthma    Dr. Sharyn Lull    Past Surgical History:  Procedure Laterality Date   TONSILLECTOMY  06/03/2018     Family Psychiatric History:  Denies  Family History:  Family History  Problem Relation Age of Onset   Heart attack Other    Prostate cancer Other    Hypertension Father    Hypertension Maternal Grandfather    Cancer Maternal Grandfather    Asthma Neg Hx     Social History:  Social History   Socioeconomic History   Marital status: Single    Spouse name: Not on file   Number of children: Not on file   Years of education: Not on file   Highest education level: Not on file  Occupational History   Not on file  Tobacco Use   Smoking status: Never    Passive exposure: Never   Smokeless tobacco: Never  Vaping Use   Vaping Use: Never used  Substance and Sexual Activity   Alcohol use: No   Drug use: No   Sexual activity: Yes  Other Topics Concern   Not on file  Social History Narrative   Single, lives with mom in [place].   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   No T/A/Ds.   Social Determinants of Health   Financial Resource Strain: Low Risk  (11/10/2021)   Overall Financial Resource Strain (CARDIA)    Difficulty of Paying Living Expenses: Not hard at all  Food Insecurity: No Food Insecurity (11/10/2021)   Hunger Vital Sign    Worried About Running Out of Food in the Last Year: Never true    Ran Out of Food in the Last Year: Never true  Transportation Needs: No Transportation Needs (11/10/2021)   PRAPARE - Administrator, Civil Service (Medical): No    Lack of Transportation (Non-Medical): No  Physical Activity: Sufficiently Active (11/10/2021)   Exercise Vital Sign    Days of Exercise per Week: 6 days    Minutes of Exercise per Session: 30 min  Stress: Stress Concern Present (11/10/2021)   [employer] of Occupational Health - Occupational Stress Questionnaire    Feeling of Stress : Very much  Social Connections: Socially Isolated (11/10/2021)   Social Connection and Isolation Panel [NHANES]    Frequency of  Communication with Friends and Family: More than three times a week    Frequency of Social Gatherings with Friends and Family: More than three times a week    Attends Religious Services: Never    Database administrator or Organizations: No    Attends Banker Meetings: Never    Marital Status: Never married    Allergies:  Allergies  Allergen Reactions   Amoxicillin Shortness Of Breath, Swelling and Other (See Comments)    Metabolic Disorder Labs: Lab Results  Component Value Date   HGBA1C 5.5 03/14/2020   MPG 111.15 03/14/2020   MPG 114 08/27/2014   No results found for: "PROLACTIN" Lab Results  Component Value Date   CHOL 180 03/14/2020   TRIG 136 03/14/2020   HDL 33 (L) 03/14/2020   CHOLHDL 5.5 03/14/2020   VLDL 27 03/14/2020   LDLCALC 120 (H) 03/14/2020   LDLCALC 92 02/28/2019   Lab Results  Component Value Date  TSH 2.519 03/15/2020   TSH 5.616 (H) 03/14/2020    Therapeutic Level Labs: No results found for: "LITHIUM" No results found for: "VALPROATE" No results found for: "CBMZ"  Current Medications: Current Outpatient Medications  Medication Sig Dispense Refill   atomoxetine (STRATTERA) 40 MG capsule Take 1 capsule (40 mg total) by mouth daily. 30 capsule 3   Paliperidone Palmitate ER (INVEGA TRINZA) 546 MG/1.75ML injection Inject 1.75 mLs (546 mg total) into the muscle every 3 (three) months. 1.8 mL 12   albuterol (VENTOLIN HFA) 108 (90 Base) MCG/ACT inhaler CAN INHALE TWO PUFFS EVERY FOUR TO SIX HOURS AS NEEDED FOR COUGH OR WHEEZE. 8.5 each 1   benztropine (COGENTIN) 1 MG tablet Take 1 tablet (1 mg total) by mouth 3 (three) times daily. 90 tablet 3   Budeson-Glycopyrrol-Formoterol (BREZTRI AEROSPHERE) 160-9-4.8 MCG/ACT AERO Inhale 2 puffs into the lungs 2 (two) times daily. (Patient taking differently: Inhale 2 puffs into the lungs daily.) 10.7 g 5   Cholecalciferol (VITAMIN D PO) Take 2,000 Units by mouth daily.     cyclobenzaprine (FLEXERIL)  10 MG tablet Take 1 tablet (10 mg total) by mouth 2 (two) times daily as needed for muscle spasms. 30 tablet 0   ketorolac (TORADOL) 10 MG tablet Take 1 tablet (10 mg total) by mouth every 6 (six) hours as needed. 30 tablet 0   metoprolol succinate (TOPROL-XL) 50 MG 24 hr tablet Take 50 mg by mouth daily.     risperiDONE (RISPERDAL) 1 MG tablet Take 1 tablet (1 mg total) by mouth 2 (two) times daily. 60 tablet 3   Current Facility-Administered Medications  Medication Dose Route Frequency Provider Last Rate Last Admin   [START ON 03/09/2022] Paliperidone Palmitate ER (INVEGA TRINZA) injection 546 mg  546 mg Intramuscular Q30 days Shanna Cisco, NP         Musculoskeletal: Strength & Muscle Tone: within normal limits and Telehealth visit Gait & Station: normal Patient leans: N/A  Psychiatric Specialty Exam: Review of Systems  There were no vitals taken for this visit.There is no height or weight on file to calculate BMI.  General Appearance: Well Groomed  Eye Contact:  Good  Speech:  Clear and Coherent and Normal Rate  Volume:  Normal  Mood:  Euthymic, reports that he is able to cope with his depression  Affect:  Appropriate and Congruent  Thought Process:  Coherent, Goal Directed, and Linear  Orientation:  Full (Time, Place, and Person)  Thought Content: Logical and Hallucinations: Visual   Suicidal Thoughts:  No  Homicidal Thoughts:  No  Memory:  Immediate;   Good Recent;   Good Remote;   Good  Judgement:  Good  Insight:  Good  Psychomotor Activity:  Normal  Concentration:  Concentration: Good and Attention Span: Good  Recall:  Good  Fund of Knowledge: Good  Language: Good  Akathisia:  No  Handed:  Right  AIMS (if indicated): not done  Assets:  Communication Skills Desire for Improvement Financial Resources/Insurance Housing Physical Health Social Support  ADL's:  Intact  Cognition: WNL  Sleep:  Good   Screenings: AIMS    Flowsheet Row Admission  (Discharged) [facility]  AIMS Total Score 0      AUDIT    Flowsheet Row Admission (Discharged) from [facility]  Alcohol Use Disorder Identification Test Final Score (AUDIT) 1      GAD-7  Flowsheet Row Video Visit from 02/05/2022 in [facility] Video Visit from 01/21/2022 in [facility] Office Visit from 11/03/2021 in [facility]  Total GAD-7 Score 10 10 5       PHQ2-9    Flowsheet Row Video Visit from 02/05/2022 in [facility] Video Visit from 01/21/2022 in [facility] Counselor from 11/10/2021 in [facility] Office Visit from 11/03/2021 in [facility] Office Visit from 05/22/2019 in [facility]  PHQ-2 Total Score 4 2 0 0 0  PHQ-9 Total Score 16 12 8 6  --      Flowsheet Row Video Visit from 01/21/2022 in [facility] Counselor from 11/10/2021 in [facility] Office Visit from 11/03/2021 in [facility]  C-SSRS RISK CATEGORY Error: Q7 should not be populated when Q6 is No No Risk No Risk        Assessment and Plan: Patient reports that his TD has improved however notes that continues to have some abnormal muscle movements.  Today Cogentin increased from 1 mg twice daily to 1 mg 3 times daily.  Patient also informed 01/04/2022 that he would like to trial [facility].  Medications ordered and sent to preferred pharmacy.  Patient describes black and clear ocular orbs.  He will follow-up with his ophthalmologist for further assessment.  Patient also endorses symptoms of ADHD and is agreeable to starting Strattera 40 mg to help manage symptoms.  Patient will continue  Risperdal as prescribed.  1. Schizoaffective disorder, bipolar type (HCC)  Increased- benztropine (COGENTIN) 1 MG tablet; Take 1 tablet (1 mg total) by mouth 3 (three) times daily.  Dispense: 90 tablet; Refill: 3 Continue- risperiDONE (RISPERDAL) 1 MG tablet; Take 1 tablet (1 mg total) by mouth 2 (two) times daily.  Dispense: 60 tablet; Refill: 3 Start- Paliperidone Palmitate ER (INVEGA TRINZA) injection 546 mg Start- Paliperidone Palmitate ER (INVEGA TRINZA) 546 MG/1.75ML injection; Inject 1.75 mLs (546 mg total) into the muscle every 3 (three) months.  Dispense: 1.8 mL; Refill: 12  2. Attention deficit hyperactivity disorder (ADHD), predominantly inattentive type  Start- atomoxetine (STRATTERA) 40 MG capsule; Take 1 capsule (40 mg total) by mouth daily.  Dispense: 30 capsule; Refill: 3    Collaboration of Care: Collaboration of Care: Other provider involved in patient's care AEB counselor  Patient/Guardian was advised Release of Information must be obtained prior to any record release in order to collaborate their care with an outside provider. Patient/Guardian was advised if they have not already done so to contact the registration department to sign all necessary forms in order for Clinical research associate to release information regarding their care.   Consent: Patient/Guardian gives verbal consent for treatment and assignment of benefits for services provided during this visit. Patient/Guardian expressed understanding and agreed to proceed.   Follow-up in 3 months Follow-up therapy [place], NP 02/05/2022, 4:09 PM

## 2022-02-10 ENCOUNTER — Ambulatory Visit (INDEPENDENT_AMBULATORY_CARE_PROVIDER_SITE_OTHER): Payer: No Typology Code available for payment source | Admitting: Allergy & Immunology

## 2022-02-10 ENCOUNTER — Encounter: Payer: Self-pay | Admitting: Allergy & Immunology

## 2022-02-10 VITALS — BP 128/86 | HR 101 | Temp 98.2°F | Resp 20 | Ht 73.0 in | Wt 341.6 lb

## 2022-02-10 DIAGNOSIS — J3089 Other allergic rhinitis: Secondary | ICD-10-CM

## 2022-02-10 DIAGNOSIS — J4550 Severe persistent asthma, uncomplicated: Secondary | ICD-10-CM

## 2022-02-10 MED ORDER — BREZTRI AEROSPHERE 160-9-4.8 MCG/ACT IN AERO: 2.0000 | INHALATION_SPRAY | Freq: Two times a day (BID) | RESPIRATORY_TRACT | 5 refills | Status: DC

## 2022-02-10 NOTE — Patient Instructions (Addendum)
Moderate persistent asthma, uncomplicated - Lung testing looks good today. - We are not going to make any medication changes at this time. - Daily controller medication(s): Breztri two puffs in the morning (DO THIS twice daily if you are having persistent symptoms) - Prior to physical activity: albuterol 2 puffs 10-15 minutes before physical activity. - Rescue medications: albuterol 4 puffs every 4-6 hours as needed - Changes during respiratory infections or worsening symptoms: Increase Breztri to 2 puffs twice daily for TWO WEEKS. - Asthma control goals:  * Full participation in all desired activities (may need albuterol before activity) * Albuterol use two time or less a week on average (not counting use with activity) * Cough interfering with sleep two time or less a month * Oral steroids no more than once a year * No hospitalizations  2. Allergic rhinitis - Continue with Flonase one spray per nostril up to twice daily. - Continue with Zyrtec once daily.   3. Return in about 6 months (around 08/12/2022).    Please inform us of any Emergency Department visits, hospitalizations, or changes in symptoms. Call us before going to the ED for breathing or allergy symptoms since we might be able to fit you in for a sick visit. Feel free to contact us anytime with any questions, problems, or concerns.  It was a pleasure to see you again today!  Websites that have reliable patient information: 1. American Academy of Asthma, Allergy, and Immunology: www.aaaai.org 2. Food Allergy Research and Education (FARE): foodallergy.org 3. Mothers of Asthmatics: http://www.asthmacommunitynetwork.org 4. American College of Allergy, Asthma, and Immunology: www.acaai.org   COVID-19 Vaccine Information can be found at: ShippingScam.co.uk For questions related to vaccine distribution or appointments, please email [email or web address] or call  [phone].   We realize that you might be concerned about having an allergic reaction to the COVID19 vaccines. To help with that concern, WE ARE OFFERING THE COVID19 VACCINES IN OUR OFFICE! Ask the front desk for dates!     "Like" [place] on Facebook and Instagram for our latest updates!      A healthy democracy works best when [employer] participate! Make sure you are registered to vote! If you have moved or changed any of your contact information, you will need to get this updated before voting!  In some cases, you MAY be able to register to vote online: [email or web address]

## 2022-02-10 NOTE — Progress Notes (Signed)
FOLLOW UP  Date of Service/Encounter:  02/10/22   Assessment:   Moderate persistent asthma, uncomplicated - well controlled   Allergic rhinoconjunctivitis  Complicated past medical history including schizoaffective disorder  Plan/Recommendations:   Moderate persistent asthma, uncomplicated - Lung testing looks good today. - We are not going to make any medication changes at this time. - Daily controller medication(s): Breztri two puffs in the morning (DO THIS twice daily if you are having persistent symptoms) - Prior to physical activity: albuterol 2 puffs 10-15 minutes before physical activity. - Rescue medications: albuterol 4 puffs every 4-6 hours as needed - Changes during respiratory infections or worsening symptoms: Increase Breztri to 2 puffs twice daily for TWO WEEKS. - Asthma control goals:  * Full participation in all desired activities (may need albuterol before activity) * Albuterol use two time or less a week on average (not counting use with activity) * Cough interfering with sleep two time or less a month * Oral steroids no more than once a year * No hospitalizations  2. Allergic rhinitis - Continue with Flonase one spray per nostril up to twice daily. - Continue with Zyrtec once daily.   3. Return in about 6 months (around 08/12/2022).    Subjective:   Nathaniel [name] is a 21 y.o. male presenting today for follow up of  Chief Complaint  Patient presents with   Asthma    6 mth f/u - Good - some wheezing today   Other Allergic rhinits    6 mth f/u - [place] due to the weather    Nathaniel [name] has a history of the following: Patient Active Problem List   Diagnosis Date Noted   Attention deficit hyperactivity disorder (ADHD), predominantly inattentive type 02/05/2022   Schizoaffective disorder, bipolar type ([place]) 11/03/2021   Generalized anxiety disorder 03/14/2020   Psychoactive substance-induced psychosis ([place]) 03/13/2020   Obstructive sleep apnea of  adult 03/22/2018   Hypertrophy, nasal, turbinate 03/22/2018   Sore throat 07/13/2017   Hypogonadism male 04/02/2015   Acquired acanthosis nigricans 11/27/2014   Pre-diabetes 07/29/2013   Morbid obesity ([place]) 07/29/2013   Goiter 07/29/2013   Dyspepsia 07/29/2013   Essential hypertension, benign 07/29/2013   Gynecomastia, male 07/29/2013   PHARYNGITIS 04/16/2008   Acute upper respiratory infection 04/16/2008   INFLUENZA WITH OTHER RESPIRATORY MANIFESTATIONS 02/06/2008   HERPETIC WHITLOW 12/12/2007   COLD SORE 12/12/2007   MIXED RECEPTIVE-EXPRESSIVE LANGUAGE DISORDER 12/12/2007   CONSTIPATION 12/12/2007   URTICARIA 12/12/2007   UNSPECIFIED FETAL AND NEONATAL JAUNDICE 12/12/2007   CARDIAC FLOW MURMUR 12/12/2007   Perennial allergic rhinitis 12/09/2007   ASTHMA, MILD 12/09/2007    History obtained from: chart review and patient.  Nathaniel [name] is a 20 y.o. male presenting for a follow up visit.  He was last seen in April 2023.  At that time, we continue with Breztri 2 puffs twice daily.  He was also continued on Flonase as well as albuterol and Zyrtec.  Since last visit, he has done fairly well.  He was admitted in June for 6 days for management of schizoaffective disorder.  He evidently was making more threats to kill himself and his father, which is why he was admitted.   Asthma/Respiratory Symptom History: He picked up smoking recently. He thinks that this has been making his breathing worse. He smoked yesterday and he has not smoked anything in 6 days before that.   He was told that nicotine would be beneficial to make him feel better. He has been working  out again upwards of 6 days per week. 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   Allergic Rhinitis Symptom History: He remains on Zyrtec.  He has not needed antibiotics at all since last time we saw him.  Her got his flu and COVID booster a few days ago.  He has felt rather drained since that time.  Otherwise,  there have been no changes to his past medical history, surgical history, family history, or social history.    ROS     Objective:   Blood pressure 128/86, pulse (!) 101, temperature 98.2 F (36.8 C), resp. rate 20, height 6\' 1"  (1.854 m), weight (!) 341 lb 9.6 oz (154.9 kg), SpO2 98 %. Body mass index is 45.07 kg/m.    Physical Exam Vitals reviewed.  Constitutional:      Appearance: He is well-developed.  HENT:     Head: Normocephalic and atraumatic.     Right Ear: Tympanic membrane, ear canal and external ear normal.     Left Ear: Tympanic membrane, ear canal and external ear normal.     Nose: No nasal deformity, septal deviation, mucosal edema or rhinorrhea.     Right Turbinates: Not enlarged or swollen.     Left Turbinates: Not enlarged or swollen.     Right Sinus: No maxillary sinus tenderness or frontal sinus tenderness.     Left Sinus: No maxillary sinus tenderness or frontal sinus tenderness.     Mouth/Throat:     Mouth: Mucous membranes are not pale and not dry.     Pharynx: Uvula midline.  Eyes:     General: Lids are normal. No allergic shiner.       Right eye: No discharge.        Left eye: No discharge.     Conjunctiva/sclera: Conjunctivae normal.     Right eye: Right conjunctiva is not injected. No chemosis.    Left eye: Left conjunctiva is not injected. No chemosis.    Pupils: Pupils are equal, round, and reactive to light.  Cardiovascular:     Rate and Rhythm: Normal rate and regular rhythm.     Heart sounds: Normal heart sounds.  Pulmonary:     Effort: Pulmonary effort is normal. No tachypnea, accessory muscle usage or respiratory distress.     Breath sounds: Normal breath sounds. No wheezing, rhonchi or rales.  Chest:     Chest wall: No tenderness.  Lymphadenopathy:     Cervical: No cervical adenopathy.  Skin:    Coloration: Skin is not pale.     Findings: No abrasion, erythema, petechiae or rash. Rash is not papular, urticarial or vesicular.   Neurological:     Mental Status: He is alert.  Psychiatric:        Behavior: Behavior is cooperative.      Diagnostic studies:    Spirometry: results normal (FEV1: 3.70/75%, FVC: 4.34/73%, FEV1/FVC: 85%).    Spirometry consistent with normal pattern.    Allergy Studies: none        Salvatore Marvel, MD  [facility] [employer] [facility] [place]

## 2022-02-12 ENCOUNTER — Encounter: Payer: Self-pay | Admitting: Allergy & Immunology

## 2022-02-26 ENCOUNTER — Ambulatory Visit (INDEPENDENT_AMBULATORY_CARE_PROVIDER_SITE_OTHER): Payer: No Typology Code available for payment source | Admitting: *Deleted

## 2022-02-26 ENCOUNTER — Encounter (HOSPITAL_COMMUNITY): Payer: Self-pay

## 2022-02-26 ENCOUNTER — Ambulatory Visit (INDEPENDENT_AMBULATORY_CARE_PROVIDER_SITE_OTHER): Payer: No Typology Code available for payment source | Admitting: Psychiatry

## 2022-02-26 ENCOUNTER — Encounter (HOSPITAL_COMMUNITY): Payer: Self-pay | Admitting: Psychiatry

## 2022-02-26 VITALS — BP 137/81 | HR 100 | Ht 73.0 in | Wt 337.0 lb

## 2022-02-26 DIAGNOSIS — F250 Schizoaffective disorder, bipolar type: Secondary | ICD-10-CM | POA: Diagnosis not present

## 2022-02-26 DIAGNOSIS — F900 Attention-deficit hyperactivity disorder, predominantly inattentive type: Secondary | ICD-10-CM | POA: Diagnosis not present

## 2022-02-26 MED ORDER — RISPERIDONE 1 MG PO TABS: 1.0000 mg | ORAL_TABLET | Freq: Two times a day (BID) | ORAL | 3 refills | Status: DC

## 2022-02-26 MED ORDER — ATOMOXETINE HCL 40 MG PO CAPS: 40.0000 mg | ORAL_CAPSULE | Freq: Every day | ORAL | 3 refills | Status: DC

## 2022-02-26 MED ORDER — BENZTROPINE MESYLATE 2 MG PO TABS: 2.0000 mg | ORAL_TABLET | Freq: Two times a day (BID) | ORAL | 3 refills | Status: DC

## 2022-02-26 MED ORDER — INVEGA TRINZA 546 MG/1.75ML IM SUSY: 546.0000 mg | PREFILLED_SYRINGE | INTRAMUSCULAR | 12 refills | Status: DC

## 2022-02-26 NOTE — Progress Notes (Signed)
Paliperidone Palmitate ER (INVEGA TRINZA) injection 546 mg 546 mg, Intramuscular, Every 3 months  Patient doing well & pleasant as Always

## 2022-02-26 NOTE — Progress Notes (Signed)
BH MD/PA/NP OP Progress Note    02/26/2022 4:03 PM Nathaniel [name]  MRN:  [id number]  Chief Complaint: "My head is more clear"  HPI:  21 year old male seen today for follow-up psychiatric evaluation. He has a psychiatric history of substance use, tobacco dependence, schizoaffective disorder bipolar type, and anxiety.  He is currently managed on Risperdal 1 mg twice daily Invega Trinza 546 mg every three months, Strattera 40 mg daily, and cogentin 1 mg three times daily. He informed Clinical research associate that his medications are somewhat effective in managing his psychiatric conditions.  Today he is well-groomed, pleasant, cooperative, engaged in conversation, maintaining eye contact.  He informed Clinical research associate that his medications make his head more clear.  He reports that he is less paranoid and has not been experiencing hallucinations.   Patient notes at times he is anxious and depressed but reports that he is able to cope with it with the assistance of therapy.  Today provider conducted a GAD-7 and patient scored a 5, at his last visit he scored a 10.  Provider also conducted PHQ-9 and patient scored a 10, at his last visit he scored a 16.  He endorses adequate sleep and appetite.  Today he denies SI/HI/VAH, mania, paranoia.  Patient informed writer that he has abnormal muscle movements in his face, arms, and legs.  He notes that it happens sparingly.  An aims assessment was conducted today and patient scored a 1.  Provider did not notice abnormal movements at patient's visit today however he notes that at times he is aware of it.  Patient reports that he occasionally has symptoms of TD.  Provider informed patient that Risperdal oral tablets as well as Eyvonne Mechanic can contribute to these movements.  Provider recommended reducing Risperdal however patient was not agreeable.  He was agreeable to increasing Cogentin 1 mg 3 times daily to 2 mg twice daily to help manage abnormal movements.  He will continue all other  medications as prescribed.  No other concerns at this time.  Visit Diagnosis:    ICD-10-CM   1. Attention deficit hyperactivity disorder (ADHD), predominantly inattentive type  F90.0 atomoxetine (STRATTERA) 40 MG capsule    2. Schizoaffective disorder, bipolar type (HCC)  F25.0 Paliperidone Palmitate ER (INVEGA TRINZA) 546 MG/1.75ML injection    risperiDONE (RISPERDAL) 1 MG tablet    benztropine (COGENTIN) 2 MG tablet      Past Psychiatric History:  substance use, tobacco dependence, schizoaffective disorder bipolar type, and anxiety  Past Medical History:  Past Medical History:  Diagnosis Date   Allergic rhinitis    Chronic tonsillar hypertrophy    ENT did tonsillectomy 2020   Elevated blood pressure reading without diagnosis of hypertension    IFG (impaired fasting glucose) 12/2017   Gluc 107, HbA1c 5.5%   Obesity, Class II, BMI 35-39.9    OSA (obstructive sleep apnea) 02/2018   Sleep study recommended by Dr. Ivory Broad considering it.   Severe persistent asthma    Dr. Sharyn Lull    Past Surgical History:  Procedure Laterality Date   TONSILLECTOMY  06/03/2018    Family Psychiatric History:  Denies  Family History:  Family History  Problem Relation Age of Onset   Heart attack Other    Prostate cancer Other    Hypertension Father    Hypertension Maternal Grandfather    Cancer Maternal Grandfather    Asthma Neg Hx     Social History:  Social History   Socioeconomic History   Marital status: Single  Spouse name: Not on file   Number of children: Not on file   Years of education: Not on file   Highest education level: Not on file  Occupational History   Not on file  Tobacco Use   Smoking status: Never    Passive exposure: Never   Smokeless tobacco: Never  Vaping Use   Vaping Use: Never used  Substance and Sexual Activity   Alcohol use: No   Drug use: No   Sexual activity: Yes  Other Topics Concern   Not on file  Social History Narrative   Single, lives  with mom in [place].   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   No T/A/Ds.   Social Determinants of Health   Financial Resource Strain: Low Risk  (11/10/2021)   Overall Financial Resource Strain (CARDIA)    Difficulty of Paying Living Expenses: Not hard at all  Food Insecurity: No Food Insecurity (11/10/2021)   Hunger Vital Sign    Worried About Running Out of Food in the Last Year: Never true    Ran Out of Food in the Last Year: Never true  Transportation Needs: No Transportation Needs (11/10/2021)   PRAPARE - Hydrologist (Medical): No    Lack of Transportation (Non-Medical): No  Physical Activity: Sufficiently Active (11/10/2021)   Exercise Vital Sign    Days of Exercise per Week: 6 days    Minutes of Exercise per Session: 30 min  Stress: Stress Concern Present (11/10/2021)   [place]    Feeling of Stress : Very much  Social Connections: Socially Isolated (11/10/2021)   Social Connection and Isolation Panel [NHANES]    Frequency of Communication with Friends and Family: More than three times a week    Frequency of Social Gatherings with Friends and Family: More than three times a week    Attends Religious Services: Never    Marine scientist or Organizations: No    Attends Archivist Meetings: Never    Marital Status: Never married    Allergies:  Allergies  Allergen Reactions   Amoxicillin Shortness Of Breath, Swelling and Other (See Comments)    Metabolic Disorder Labs: Lab Results  Component Value Date   HGBA1C 5.5 03/14/2020   MPG 111.15 03/14/2020   MPG 114 08/27/2014   No results found for: "PROLACTIN" Lab Results  Component Value Date   CHOL 180 03/14/2020   TRIG 136 03/14/2020   HDL 33 (L) 03/14/2020   CHOLHDL 5.5 03/14/2020   VLDL 27 03/14/2020   LDLCALC 120 (H) 03/14/2020   LDLCALC 92 02/28/2019   Lab Results   Component Value Date   TSH 2.519 03/15/2020   TSH 5.616 (H) 03/14/2020    Therapeutic Level Labs: No results found for: "LITHIUM" No results found for: "VALPROATE" No results found for: "CBMZ"  Current Medications: Current Outpatient Medications  Medication Sig Dispense Refill   benztropine (COGENTIN) 2 MG tablet Take 1 tablet (2 mg total) by mouth 2 (two) times daily. 60 tablet 3   albuterol (VENTOLIN HFA) 108 (90 Base) MCG/ACT inhaler CAN INHALE TWO PUFFS EVERY FOUR TO SIX HOURS AS NEEDED FOR COUGH OR WHEEZE. 8.5 each 1   atomoxetine (STRATTERA) 40 MG capsule Take 1 capsule (40 mg total) by mouth daily. 30 capsule 3   Budeson-Glycopyrrol-Formoterol (BREZTRI AEROSPHERE) 160-9-4.8 MCG/ACT AERO Inhale 2 puffs into the lungs 2 (two) times  daily. 10.7 g 5   Cholecalciferol (VITAMIN D PO) Take 2,000 Units by mouth daily.     cyclobenzaprine (FLEXERIL) 10 MG tablet Take 1 tablet (10 mg total) by mouth 2 (two) times daily as needed for muscle spasms. 30 tablet 0   metoprolol succinate (TOPROL-XL) 50 MG 24 hr tablet Take 50 mg by mouth daily.     Paliperidone Palmitate ER (INVEGA TRINZA) 546 MG/1.75ML injection Inject 1.75 mLs (546 mg total) into the muscle every 3 (three) months. 1.8 mL 12   risperiDONE (RISPERDAL) 1 MG tablet Take 1 tablet (1 mg total) by mouth 2 (two) times daily. 60 tablet 3   Current Facility-Administered Medications  Medication Dose Route Frequency Provider Last Rate Last Admin   [START ON 03/09/2022] Paliperidone Palmitate ER (INVEGA TRINZA) injection 546 mg  546 mg Intramuscular Q30 days Toy Cookey E, NP   546 mg at 02/26/22 1535     Musculoskeletal: Strength & Muscle Tone: within normal limits Gait & Station: normal Patient leans: N/A  Psychiatric Specialty Exam: Review of Systems  There were no vitals taken for this visit.There is no height or weight on file to calculate BMI.  General Appearance: Well Groomed  Eye Contact:  Good  Speech:  Clear and  Coherent and Normal Rate  Volume:  Normal  Mood:  Euthymic,   Affect:  Appropriate and Congruent  Thought Process:  Coherent, Goal Directed, and Linear  Orientation:  Full (Time, Place, and Person)  Thought Content: WDL and Logical   Suicidal Thoughts:  No  Homicidal Thoughts:  No  Memory:  Immediate;   Good Recent;   Good Remote;   Good  Judgement:  Good  Insight:  Good  Psychomotor Activity:  Normal  Concentration:  Concentration: Good and Attention Span: Good  Recall:  Good  Fund of Knowledge: Good  Language: Good  Akathisia:  No  Handed:  Right  AIMS (if indicated): done, no abnormal finding. Patient however reports abnormal movements occurring occasionally.   Assets:  Communication Skills Desire for Improvement Financial Resources/Insurance Housing Physical Health Social Support  ADL's:  Intact  Cognition: WNL  Sleep:  Good   Screenings: AIMS    Flowsheet Row Office Visit from 02/26/2022 in [facility] Admission (Discharged) [facility]  AIMS Total Score 1 0      AUDIT    Flowsheet Row Admission (Discharged) from [facility]  Alcohol Use Disorder Identification Test Final Score (AUDIT) 1      GAD-7    Flowsheet Row Office Visit from 02/26/2022 in [facility] Video Visit from 02/05/2022 in [facility] Video Visit from 01/21/2022 in [facility] Office Visit from 11/03/2021 in [facility]  Total GAD-7 Score 5 10 10 5       PHQ2-9    Flowsheet Row Office Visit from 02/26/2022 in [facility] Video Visit from 02/05/2022 in [facility] Video Visit from 01/21/2022 in [facility] Counselor from 11/10/2021 in [facility]  PHQ-2 Total Score 2 4 2  0 0  PHQ-9 Total Score 10 16 12 8 6       Flowsheet Row Office Visit from 02/26/2022 in [facility] Video Visit  from 01/21/2022 in [facility] Counselor from 11/10/2021 in [facility]  C-SSRS RISK CATEGORY Error: Question 6 not populated Error: Q7 should not be populated when Q6 is No No Risk        Assessment and Plan: Patient reports that he occasionally has symptoms of TD.  Provider informed patient that Risperdal oral tablets as well as Eyvonne Mechanic can contribute to these movements.  Provider recommended reducing Risperdal however patient was not agreeable.  He was agreeable to increasing Cogentin 1 mg 3 times daily to 2 mg twice daily to help manage abnormal movements.  He will continue all other medications as prescribed.   1. Attention deficit hyperactivity disorder (ADHD), predominantly inattentive type  Continue- atomoxetine (STRATTERA) 40 MG capsule; Take 1 capsule (40 mg total) by mouth daily.  Dispense: 30 capsule; Refill: 3  2. Schizoaffective disorder, bipolar type (HCC)  Continue- Paliperidone Palmitate ER (INVEGA TRINZA) 546 MG/1.75ML injection; Inject 1.75 mLs (546 mg total) into the muscle every 3 (three) months.  Dispense: 1.8 mL; Refill: 12 Continue- risperiDONE (RISPERDAL) 1 MG tablet; Take 1 tablet (1 mg total) by mouth 2 (two) times daily.  Dispense: 60 tablet; Refill: 3 Increased- benztropine (COGENTIN) 2 MG tablet; Take 1 tablet (2 mg total) by mouth 2 (two) times daily.  Dispense: 60 tablet; Refill: 3  Follow-up in 3 months Follow-up therapy Shanna Cisco, NP 02/26/2022, 4:03 PM

## 2022-03-31 ENCOUNTER — Other Ambulatory Visit: Payer: Self-pay | Admitting: *Deleted

## 2022-03-31 ENCOUNTER — Telehealth: Payer: Self-pay | Admitting: Allergy & Immunology

## 2022-03-31 MED ORDER — BREZTRI AEROSPHERE 160-9-4.8 MCG/ACT IN AERO: 2.0000 | INHALATION_SPRAY | Freq: Two times a day (BID) | RESPIRATORY_TRACT | 1 refills | Status: DC

## 2022-03-31 NOTE — Telephone Encounter (Signed)
Nathaniel [name] called in and states when he went to get a refill on Breztri, it cost over $200.  He called insurance and they stated he would need to have a 90 day prescription called in and to a [facility] to get it cheaper.  Nathaniel [name] would like [employer] written as 90 day prescription and sent to [facility] on [address].

## 2022-03-31 NOTE — Telephone Encounter (Signed)
New prescription has been sent in for 90 day prescription. Called and left a voicemail asking for patient to return call to inform.

## 2022-04-01 ENCOUNTER — Encounter: Payer: Self-pay | Admitting: *Deleted

## 2022-04-01 NOTE — Telephone Encounter (Signed)
Attempted to call patient, went straight to voicemail. MyChart message sent to patient.

## 2022-04-07 ENCOUNTER — Telehealth (HOSPITAL_COMMUNITY): Payer: Self-pay

## 2022-04-07 ENCOUNTER — Other Ambulatory Visit: Payer: Self-pay

## 2022-04-07 ENCOUNTER — Other Ambulatory Visit (HOSPITAL_COMMUNITY): Payer: Self-pay | Admitting: Psychiatry

## 2022-04-07 DIAGNOSIS — F900 Attention-deficit hyperactivity disorder, predominantly inattentive type: Secondary | ICD-10-CM

## 2022-04-07 MED ORDER — BREZTRI AEROSPHERE 160-9-4.8 MCG/ACT IN AERO: 2.0000 | INHALATION_SPRAY | Freq: Two times a day (BID) | RESPIRATORY_TRACT | 1 refills | Status: DC

## 2022-04-07 MED ORDER — ATOMOXETINE HCL 40 MG PO CAPS: 40.0000 mg | ORAL_CAPSULE | Freq: Every day | ORAL | 3 refills | Status: DC

## 2022-04-07 NOTE — Telephone Encounter (Signed)
Medication refilled and sent to preferred pharmacy

## 2022-04-22 ENCOUNTER — Telehealth (HOSPITAL_COMMUNITY): Payer: No Typology Code available for payment source | Admitting: Psychiatry

## 2022-05-07 ENCOUNTER — Telehealth (HOSPITAL_COMMUNITY): Payer: No Typology Code available for payment source | Admitting: Psychiatry

## 2022-05-18 ENCOUNTER — Other Ambulatory Visit (HOSPITAL_COMMUNITY): Payer: Self-pay | Admitting: Psychiatry

## 2022-05-18 DIAGNOSIS — F250 Schizoaffective disorder, bipolar type: Secondary | ICD-10-CM

## 2022-05-28 ENCOUNTER — Encounter (HOSPITAL_COMMUNITY): Payer: Self-pay

## 2022-05-28 ENCOUNTER — Ambulatory Visit (INDEPENDENT_AMBULATORY_CARE_PROVIDER_SITE_OTHER): Payer: No Typology Code available for payment source | Admitting: *Deleted

## 2022-05-28 ENCOUNTER — Encounter (HOSPITAL_COMMUNITY): Payer: Self-pay | Admitting: Registered Nurse

## 2022-05-28 ENCOUNTER — Ambulatory Visit (HOSPITAL_COMMUNITY): Payer: No Typology Code available for payment source | Admitting: Registered Nurse

## 2022-05-28 VITALS — BP 144/93 | HR 124 | Temp 98.5°F | Resp 16 | Ht 73.0 in | Wt 333.4 lb

## 2022-05-28 DIAGNOSIS — F250 Schizoaffective disorder, bipolar type: Secondary | ICD-10-CM | POA: Diagnosis not present

## 2022-05-28 DIAGNOSIS — F900 Attention-deficit hyperactivity disorder, predominantly inattentive type: Secondary | ICD-10-CM

## 2022-05-28 MED ORDER — BENZTROPINE MESYLATE 2 MG PO TABS: 2.0000 mg | ORAL_TABLET | Freq: Two times a day (BID) | ORAL | 2 refills | Status: DC

## 2022-05-28 MED ORDER — ATOMOXETINE HCL 40 MG PO CAPS: 40.0000 mg | ORAL_CAPSULE | Freq: Every day | ORAL | 3 refills | Status: DC

## 2022-05-28 MED ORDER — INVEGA TRINZA 546 MG/1.75ML IM SUSY: 546.0000 mg | PREFILLED_SYRINGE | INTRAMUSCULAR | 12 refills | Status: DC

## 2022-05-28 NOTE — Progress Notes (Signed)
Patient arrived for injection of Invega trinza 546mg . Given in left deltoid without issues or complaints. No SI/HI or AV Hallucinations.  Pleasant and cooperative.

## 2022-05-28 NOTE — Progress Notes (Signed)
BH MD/PA/NP OP Progress Note  05/28/2022 3:48 PM Nathaniel [name]  MRN:  [id number]  Chief Complaint:  Chief Complaint  Patient presents with   Follow-up    Psychiatric evaluation and administration of long acting injection   HPI: Nathaniel [name] 22 y.o. male seen face to face in office today for follow-up psychiatric evaluation and administration of long acting injectable .  His psychiatric history includes substance use, tobacco dependence, schizoaffective disorder bipolar type, and anxiety.  His mental health is managed with Risperdal 1 mg twice daily Invega Trinza 546 mg every three months, Strattera 40 mg daily, and Cogentin 1 mg three times daily.  He reports medication continues to manage his mental health well without adverse reaction other than sleeping to much for last 3 months since starting the [place].  Discussed discontinuing the Risperdal 1 mg Bid "I think that would be good cause even when I take at night I'm still fatigued in the morning.  Instructed to call or come back to office if he notices any symptoms or regressing.  Understanding voiced and agreed.  He denies depression, anxiety, fluctuations in mood, abnormal movement, suicidal/self-harm/homicidal ideation, psychosis, and paranoia.  AIMS, PHQ 2 & 9, C-SSRS, and GAD 7 screenings conducted by provider see scores below.  He reports eating and sleeping without difficulty.    Visit Diagnosis:    ICD-10-CM   1. Schizoaffective disorder, bipolar type (HCC)  F25.0 Paliperidone Palmitate ER (INVEGA TRINZA) 546 MG/1.75ML injection    benztropine (COGENTIN) 2 MG tablet    2. Attention deficit hyperactivity disorder (ADHD), predominantly inattentive type  F90.0 atomoxetine (STRATTERA) 40 MG capsule      Past Psychiatric History: substance use, tobacco dependence, schizoaffective disorder bipolar type, and anxiety.  Past Medical History:  Past Medical History:  Diagnosis Date   Allergic rhinitis    Chronic tonsillar hypertrophy     ENT did tonsillectomy 2020   Elevated blood pressure reading without diagnosis of hypertension    IFG (impaired fasting glucose) 12/2017   Gluc 107, HbA1c 5.5%   Obesity, Class II, BMI 35-39.9    OSA (obstructive sleep apnea) 02/2018   Sleep study recommended by Dr. Ivory Broad considering it.   Severe persistent asthma    Dr. Sharyn Lull    Past Surgical History:  Procedure Laterality Date   TONSILLECTOMY  06/03/2018    Family Psychiatric History: None reported  Family History:  Family History  Problem Relation Age of Onset   Heart attack Other    Prostate cancer Other    Hypertension Father    Hypertension Maternal Grandfather    Cancer Maternal Grandfather    Asthma Neg Hx     Social History:  Social History   Socioeconomic History   Marital status: Single    Spouse name: Not on file   Number of children: Not on file   Years of education: Not on file   Highest education level: Not on file  Occupational History   Not on file  Tobacco Use   Smoking status: Never    Passive exposure: Never   Smokeless tobacco: Never  Vaping Use   Vaping Use: Never used  Substance and Sexual Activity   Alcohol use: No   Drug use: No   Sexual activity: Yes  Other Topics Concern   Not on file  Social History Narrative   Single, lives with mom in [place].   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   No  T/A/Ds.   Social Determinants of Health   Financial Resource Strain: Low Risk  (11/10/2021)   Overall Financial Resource Strain (CARDIA)    Difficulty of Paying Living Expenses: Not hard at all  Food Insecurity: No Food Insecurity (11/10/2021)   Hunger Vital Sign    Worried About Running Out of Food in the Last Year: Never true    Ran Out of Food in the Last Year: Never true  Transportation Needs: No Transportation Needs (11/10/2021)   PRAPARE - Hydrologist (Medical): No    Lack of Transportation (Non-Medical): No   Physical Activity: Sufficiently Active (11/10/2021)   Exercise Vital Sign    Days of Exercise per Week: 6 days    Minutes of Exercise per Session: 30 min  Stress: Stress Concern Present (11/10/2021)   [place]    Feeling of Stress : Very much  Social Connections: Socially Isolated (11/10/2021)   Social Connection and Isolation Panel [NHANES]    Frequency of Communication with Friends and Family: More than three times a week    Frequency of Social Gatherings with Friends and Family: More than three times a week    Attends Religious Services: Never    Marine scientist or Organizations: No    Attends Archivist Meetings: Never    Marital Status: Never married    Allergies:  Allergies  Allergen Reactions   Amoxicillin Shortness Of Breath, Swelling and Other (See Comments)    Metabolic Disorder Labs: Lab Results  Component Value Date   HGBA1C 5.5 03/14/2020   MPG 111.15 03/14/2020   MPG 114 08/27/2014   No results found for: "PROLACTIN" Lab Results  Component Value Date   CHOL 180 03/14/2020   TRIG 136 03/14/2020   HDL 33 (L) 03/14/2020   CHOLHDL 5.5 03/14/2020   VLDL 27 03/14/2020   LDLCALC 120 (H) 03/14/2020   LDLCALC 92 02/28/2019   Lab Results  Component Value Date   TSH 2.519 03/15/2020   TSH 5.616 (H) 03/14/2020    Therapeutic Level Labs: No results found for: "LITHIUM" No results found for: "VALPROATE" No results found for: "CBMZ"  Current Medications: Current Outpatient Medications  Medication Sig Dispense Refill   albuterol (VENTOLIN HFA) 108 (90 Base) MCG/ACT inhaler CAN INHALE TWO PUFFS EVERY FOUR TO SIX HOURS AS NEEDED FOR COUGH OR WHEEZE. 8.5 each 1   atomoxetine (STRATTERA) 40 MG capsule Take 1 capsule (40 mg total) by mouth daily. 90 capsule 3   benztropine (COGENTIN) 2 MG tablet Take 1 tablet (2 mg total) by mouth 2 (two) times daily. 180 tablet 2    Budeson-Glycopyrrol-Formoterol (BREZTRI AEROSPHERE) 160-9-4.8 MCG/ACT AERO Inhale 2 puffs into the lungs 2 (two) times daily. 32.1 g 1   Cholecalciferol (VITAMIN D PO) Take 2,000 Units by mouth daily.     cyclobenzaprine (FLEXERIL) 10 MG tablet Take 1 tablet (10 mg total) by mouth 2 (two) times daily as needed for muscle spasms. 30 tablet 0   metoprolol succinate (TOPROL-XL) 50 MG 24 hr tablet Take 50 mg by mouth daily.     Paliperidone Palmitate ER (INVEGA TRINZA) 546 MG/1.75ML injection Inject 1.75 mLs (546 mg total) into the muscle every 3 (three) months. 1.8 mL 12   risperiDONE (RISPERDAL) 1 MG tablet Take 1 tablet (1 mg total) by mouth 2 (two) times daily. 60 tablet 3   Current Facility-Administered Medications  Medication Dose Route Frequency Provider Last Rate  Last Admin   Paliperidone Palmitate ER (INVEGA TRINZA) injection 546 mg  546 mg Intramuscular Q30 days Eulis Canner E, NP   546 mg at 02/26/22 1535     Musculoskeletal: Strength & Muscle Tone: within normal limits Gait & Station: normal Patient leans: N/A  Psychiatric Specialty Exam: Review of Systems  Psychiatric/Behavioral:  Negative for agitation, decreased concentration, dysphoric mood, hallucinations, self-injury and suicidal ideas. Sleep disturbance: States he has been sleeping to much and feels it is related to the medications.Nervous/anxious: Stable.   All other systems reviewed and are negative.   There were no vitals taken for this visit.There is no height or weight on file to calculate BMI.  General Appearance: Casual  Eye Contact:  Good  Speech:  Clear and Coherent and Normal Rate  Volume:  Normal  Mood:  Euthymic  Affect:  Appropriate and Congruent  Thought Process:  Coherent, Goal Directed, and Descriptions of Associations: Intact  Orientation:  Full (Time, Place, and Person)  Thought Content: Logical   Suicidal Thoughts:  No  Homicidal Thoughts:  No  Memory:  Immediate;   Good Recent;    Good Remote;   Good  Judgement:  Intact  Insight:  Present  Psychomotor Activity:  Normal  Concentration:  Concentration: Good and Attention Span: Good  Recall:  Good  Fund of Knowledge: Good  Language: Good  Akathisia:  No  Handed:  Right  AIMS (if indicated): done  Assets:  Communication Skills Desire for Improvement Financial Resources/Insurance Housing Leisure Time Physical Health Resilience Social Support Transportation  ADL's:  Intact  Cognition: WNL  Sleep:  Good   Screenings: [place] Office Visit from 05/28/2022 in [facility] Office Visit from 02/26/2022 in [facility] Admission (Discharged) from OP Visit from 03/13/2020 in [place] [facility]  AIMS Total Score 0 1 0      AUDIT    Flowsheet Row Admission (Discharged) from OP Visit from 03/13/2020 in [place] [facility]  Alcohol Use Disorder Identification Test Final Score (AUDIT) 1      GAD-7    Flowsheet Row Office Visit from 05/28/2022 in [facility] Office Visit from 02/26/2022 in [facility] Video Visit from 02/05/2022 in [facility] Video Visit from 01/21/2022 in [facility] Office Visit from 11/03/2021 in [facility]  Total GAD-7 Score 0 5 10 10 5       PHQ2-9    [place] Office Visit from 05/28/2022 in [facility] Office Visit from 02/26/2022 in [facility] Video Visit from 02/05/2022 in [facility] Video Visit from 01/21/2022 in [facility] Counselor from 11/10/2021 in [facility]  PHQ-2 Total Score 0 2 4 2  0  PHQ-9 Total Score -- 10 16 12 8       [place] Office Visit from 05/28/2022 in  [facility] Office Visit from 02/26/2022 in [facility] Video Visit from 01/21/2022 in [place] No Risk Error: Question 6 not populated Error: Q7 should not be populated when Q6 is No        Assessment and Plan: Nathaniel [name] appears to be doing well.  He reports that medications are effective and managing his mental health without any  adverse reaction.  But did have complaints of sleeping to much related to medications.  Discontinued Risperdal 1 mg Bid.  During visit he is dressed appropriate for weather.  He is sitting upright in chair with no noted distress.  He is alert/oriented x 4, calm/cooperative and mood is congruent with affect.  He spoke in a clear tone at moderate volume, and normal pace, with good eye contact.  His thought process is coherent and relevant; and there is no indication that he is currently responding to internal/external stimuli, or experiencing delusional thought content.  He denies depression, anxiety, suicidal/self-harm/homicidal ideation, psychosis, paranoia, and fluctuations in mood.   Collaboration of Care: Collaboration of Care: Medication Management AEB Administration of long acting and medication refills Meds ordered this encounter  Medications   Paliperidone Palmitate ER (INVEGA TRINZA) 546 MG/1.75ML injection    Sig: Inject 1.75 mLs (546 mg total) into the muscle every 3 (three) months.    Dispense:  1.8 mL    Refill:  12    Order Specific Question:   Supervising Provider    Answer:   Nelly Rout [[id number]]   benztropine (COGENTIN) 2 MG tablet    Sig: Take 1 tablet (2 mg total) by mouth 2 (two) times daily.    Dispense:  180 tablet    Refill:  2    Order Specific Question:   Supervising Provider    Answer:   Nelly Rout [[id number]]   atomoxetine (STRATTERA) 40 MG capsule    Sig: Take 1 capsule (40 mg total) by mouth daily.    Dispense:  90 capsule     Refill:  3    Order Specific Question:   Supervising Provider    Answer:   Nelly Rout [[id number]]     Patient/Guardian was advised Release of Information must be obtained prior to any record release in order to collaborate their care with an outside provider. Patient/Guardian was advised if they have not already done so to contact the registration department to sign all necessary forms in order for [place] to release information regarding their care.   Consent: Patient/Guardian gives verbal consent for treatment and assignment of benefits for services provided during this visit. Patient/Guardian expressed understanding and agreed to proceed.    Rashunda Passon, NP 05/28/2022, 3:48 PM

## 2022-07-28 ENCOUNTER — Telehealth (HOSPITAL_COMMUNITY): Payer: Self-pay | Admitting: Psychiatry

## 2022-07-28 ENCOUNTER — Other Ambulatory Visit (HOSPITAL_COMMUNITY): Payer: Self-pay | Admitting: Psychiatry

## 2022-07-28 MED ORDER — INVEGA HAFYERA 1092 MG/3.5ML IM SUSY: 1092.0000 mg | PREFILLED_SYRINGE | INTRAMUSCULAR | 11 refills | Status: DC

## 2022-07-28 NOTE — Telephone Encounter (Signed)
Patient is currently prescribed Rolland Porter which he takes every 3 months.  Patient informed Probation officer that he would like to switch to [employer].  Per patient mother Rolland Porter is not covered by their insurance anymore however Adelene Idler is.  Patient still will need patient care assistance for [facility].  Provider spoke to [place] who notes that [facility] has a patient care assistance program for underinsured patients.  Provider filled out this form and faxed it.  Invega Hafyera 1092 mg ordered and sent to patient preferred pharmacy.  Patient will follow up on 08/27/2022 to receive his first dose.  No other concerns noted at this time.

## 2022-08-13 ENCOUNTER — Other Ambulatory Visit: Payer: Self-pay | Admitting: Allergy & Immunology

## 2022-08-13 ENCOUNTER — Other Ambulatory Visit: Payer: Self-pay

## 2022-08-13 ENCOUNTER — Encounter: Payer: Self-pay | Admitting: Allergy & Immunology

## 2022-08-13 ENCOUNTER — Ambulatory Visit: Payer: No Typology Code available for payment source | Admitting: Allergy & Immunology

## 2022-08-13 VITALS — BP 136/88 | HR 108 | Temp 98.2°F | Resp 18 | Ht 73.5 in | Wt 330.7 lb

## 2022-08-13 DIAGNOSIS — J3089 Other allergic rhinitis: Secondary | ICD-10-CM

## 2022-08-13 DIAGNOSIS — J4550 Severe persistent asthma, uncomplicated: Secondary | ICD-10-CM | POA: Diagnosis not present

## 2022-08-13 MED ORDER — CETIRIZINE HCL 10 MG PO TABS: 10.0000 mg | ORAL_TABLET | Freq: Every day | ORAL | 1 refills | Status: AC

## 2022-08-13 MED ORDER — BREZTRI AEROSPHERE 160-9-4.8 MCG/ACT IN AERO: 2.0000 | INHALATION_SPRAY | Freq: Two times a day (BID) | RESPIRATORY_TRACT | 1 refills | Status: DC

## 2022-08-13 MED ORDER — ALBUTEROL SULFATE HFA 108 (90 BASE) MCG/ACT IN AERS: 2.0000 | INHALATION_SPRAY | RESPIRATORY_TRACT | 1 refills | Status: AC | PRN

## 2022-08-13 MED ORDER — RYALTRIS 665-25 MCG/ACT NA SUSP: 2.0000 | Freq: Two times a day (BID) | NASAL | 5 refills | Status: DC

## 2022-08-13 NOTE — Progress Notes (Signed)
FOLLOW UP  Date of Service/Encounter:  08/13/22   Assessment:   Moderate persistent asthma, uncomplicated - well controlled   Allergic rhinoconjunctivitis   Complicated past medical history including schizoaffective disorder  Plan/Recommendations:   Moderate persistent asthma, uncomplicated - Lung testing looks amazing today [name] BETTER than last time). - We are not going to make any medication changes at this time. - Daily controller medication(s): Breztri two puffs in the morning - Prior to physical activity: albuterol 2 puffs 10-15 minutes before physical activity. - Rescue medications: albuterol 4 puffs every 4-6 hours as needed - Changes during respiratory infections or worsening symptoms: Increase Breztri to 2 puffs twice daily for TWO WEEKS. - Asthma control goals:  * Full participation in all desired activities (may need albuterol before activity) * Albuterol use two time or less a week on average (not counting use with activity) * Cough interfering with sleep two time or less a month * Oral steroids no more than once a year * No hospitalizations  2. Allergic rhinitis - Stop the Flonase for now. - Start Ryaltris two sprays twice daily to stay on top of the nasal symptoms during the worst times of the year.  - Continue with Zyrtec once daily.   3. Return to clinic in 6 months or earlier if needed.   Subjective:   Nathaniel [name] is a 22 y.o. male presenting today for follow up of  Chief Complaint  Patient presents with   Asthma    6 mth f/u - Alright    Nathaniel [name] has a history of the following: Patient Active Problem List   Diagnosis Date Noted   Attention deficit hyperactivity disorder (ADHD), predominantly inattentive type 02/05/2022   Schizoaffective disorder, bipolar type 11/03/2021   Generalized anxiety disorder 03/14/2020   Psychoactive substance-induced psychosis 03/13/2020   Obstructive sleep apnea of adult 03/22/2018   Hypertrophy, nasal,  turbinate 03/22/2018   Sore throat 07/13/2017   Hypogonadism male 04/02/2015   Acquired acanthosis nigricans 11/27/2014   Pre-diabetes 07/29/2013   Morbid obesity 07/29/2013   Goiter 07/29/2013   Dyspepsia 07/29/2013   Essential hypertension, benign 07/29/2013   Gynecomastia, male 07/29/2013   PHARYNGITIS 04/16/2008   Acute upper respiratory infection 04/16/2008   INFLUENZA WITH OTHER RESPIRATORY MANIFESTATIONS 02/06/2008   HERPETIC WHITLOW 12/12/2007   COLD SORE 12/12/2007   MIXED RECEPTIVE-EXPRESSIVE LANGUAGE DISORDER 12/12/2007   CONSTIPATION 12/12/2007   URTICARIA 12/12/2007   UNSPECIFIED FETAL AND NEONATAL JAUNDICE 12/12/2007   CARDIAC FLOW MURMUR 12/12/2007   Perennial allergic rhinitis 12/09/2007   ASTHMA, MILD 12/09/2007    History obtained from: chart review and patient.  Nathaniel [name] is a 22 y.o. male presenting for a follow up visit. He was last seen in October 2023. At that time, we continue with Breztri 2 puffs twice daily.  For his allergic rhinitis, we will continue with Flonase as well as Zyrtec.   Asthma/Respiratory Symptom History: He is doing the Breztri two puffs once daily. He thinks that this is covered well. He does not think that this is expensive. He did not have a problem getting this covered. This is controlling his inhaler. He does use his rescue inhaler infrequently.  He has recently tried to decrease his smoking, which is good news. He feels that he is breathe more effectively now without the smoking on board. Nathaniel [name] asthma has been well controlled. He has not required rescue medication, experienced nocturnal awakenings due to lower respiratory symptoms, nor have activities of daily living been limited.  He has required no Emergency Department or Urgent Care visits for his asthma. He has required zero courses of systemic steroids for asthma exacerbations since the last visit. ACT score today is 25, indicating excellent asthma symptom control.   Allergic Rhinitis  Symptom History: He does have clogging of his nostrils at night. He uses nasal saline. This is even with the Flonase. He is open to trying new nasal sprays. He has never been on shots.  I do not see his last testing.  Advised to bed before we joined epic.  He is not really interested in retesting.  He is open to trying a new nose spray.  He recently quit his job.  He is thinking about going back to school.  He is not sure what he wants to pursue, but has been in and out of college over the last several years and he wants to just finish a degree.  Otherwise, there have been no changes to his past medical history, surgical history, family history, or social history.    Review of Systems  Constitutional: Negative.  Negative for chills, fever, malaise/fatigue and weight loss.  HENT: Negative.  Negative for congestion, ear discharge and ear pain.   Eyes:  Negative for pain, discharge and redness.  Respiratory:  Negative for cough, sputum production, shortness of breath and wheezing.   Cardiovascular: Negative.  Negative for chest pain and palpitations.  Gastrointestinal:  Negative for abdominal pain, constipation, diarrhea, heartburn, nausea and vomiting.  Skin: Negative.  Negative for itching and rash.  Neurological:  Negative for dizziness and headaches.  Endo/Heme/Allergies:  Negative for environmental allergies. Does not bruise/bleed easily.       Objective:   Blood pressure 136/88, pulse (!) 108, temperature 98.2 F (36.8 C), temperature source Temporal, resp. rate 18, height 6' 1.5" (1.867 m), weight (!) 330 lb 11.2 oz (150 kg), SpO2 98 %. Body mass index is 43.04 kg/m.    Physical Exam Vitals reviewed.  Constitutional:      Appearance: He is well-developed.  HENT:     Head: Normocephalic and atraumatic.     Right Ear: Tympanic membrane, ear canal and external ear normal.     Left Ear: Tympanic membrane, ear canal and external ear normal.     Nose: No nasal deformity, septal  deviation, mucosal edema or rhinorrhea.     Right Turbinates: Enlarged, swollen and pale.     Left Turbinates: Enlarged, swollen and pale.     Right Sinus: No maxillary sinus tenderness or frontal sinus tenderness.     Left Sinus: No maxillary sinus tenderness or frontal sinus tenderness.     Mouth/Throat:     Mouth: Mucous membranes are not pale and not dry.     Pharynx: Uvula midline.  Eyes:     General: Lids are normal. No allergic shiner.       Right eye: No discharge.        Left eye: No discharge.     Conjunctiva/sclera: Conjunctivae normal.     Right eye: Right conjunctiva is not injected. No chemosis.    Left eye: Left conjunctiva is not injected. No chemosis.    Pupils: Pupils are equal, round, and reactive to light.  Cardiovascular:     Rate and Rhythm: Normal rate and regular rhythm.     Heart sounds: Normal heart sounds.  Pulmonary:     Effort: Pulmonary effort is normal. No tachypnea, accessory muscle usage or respiratory distress.     Breath sounds:  Normal breath sounds. No wheezing, rhonchi or rales.  Chest:     Chest wall: No tenderness.  Lymphadenopathy:     Cervical: No cervical adenopathy.  Skin:    Coloration: Skin is not pale.     Findings: No abrasion, erythema, petechiae or rash. Rash is not papular, urticarial or vesicular.  Neurological:     Mental Status: He is alert.  Psychiatric:        Behavior: Behavior is cooperative.      Diagnostic studies:    Spirometry: results normal (FEV1: 4.22/87%, FVC: 5.06/88%, FEV1/FVC: 83%).    Spirometry consistent with normal pattern.   Allergy Studies: none        Malachi Bonds, MD  [facility]

## 2022-08-13 NOTE — Patient Instructions (Addendum)
Moderate persistent asthma, uncomplicated - Lung testing looks amazing today [facility] BETTER than last time). - We are not going to make any medication changes at this time. - Daily controller medication(s): Breztri two puffs in the morning - Prior to physical activity: albuterol 2 puffs 10-15 minutes before physical activity. - Rescue medications: albuterol 4 puffs every 4-6 hours as needed - Changes during respiratory infections or worsening symptoms: Increase Breztri to 2 puffs twice daily for TWO WEEKS. - Asthma control goals:  * Full participation in all desired activities (may need albuterol before activity) * Albuterol use two time or less a week on average (not counting use with activity) * Cough interfering with sleep two time or less a month * Oral steroids no more than once a year * No hospitalizations  2. Allergic rhinitis - Stop the Flonase for now. - Start Ryaltris two sprays twice daily to stay on top of the nasal symptoms during the worst times of the year.  - Continue with Zyrtec once daily.   3. Return to clinic in 6 months or earlier if needed.    Please inform us of any Emergency Department visits, hospitalizations, or changes in symptoms. Call us before going to the ED for breathing or allergy symptoms since we might be able to fit you in for a sick visit. Feel free to contact us anytime with any questions, problems, or concerns.  It was a pleasure to see you again today!  Websites that have reliable patient information: 1. American Academy of Asthma, Allergy, and Immunology: www.aaaai.org 2. Food Allergy Research and Education (FARE): foodallergy.org 3. Mothers of Asthmatics: http://www.asthmacommunitynetwork.org 4. American College of Allergy, Asthma, and Immunology: www.acaai.org   COVID-19 Vaccine Information can be found at: [email or web address] For questions related to vaccine distribution or  appointments, please email [email or web address] or call [phone].   We realize that you might be concerned about having an allergic reaction to the COVID19 vaccines. To help with that concern, WE ARE OFFERING THE COVID19 VACCINES IN OUR OFFICE! Ask the front desk for dates!     "Like" [place] on Facebook and Instagram for our latest updates!      A healthy democracy works best when [employer] participate! Make sure you are registered to vote! If you have moved or changed any of your contact information, you will need to get this updated before voting!  In some cases, you MAY be able to register to vote online: [email or web address]

## 2022-08-14 ENCOUNTER — Encounter: Payer: Self-pay | Admitting: Allergy & Immunology

## 2022-08-14 ENCOUNTER — Other Ambulatory Visit (HOSPITAL_COMMUNITY): Payer: Self-pay

## 2022-08-14 NOTE — Telephone Encounter (Signed)
PA not needed, medication is covered. Per test claim will only process for a 30 day supply at a time, script is written for 90 days.

## 2022-08-18 ENCOUNTER — Telehealth: Payer: Self-pay | Admitting: Allergy & Immunology

## 2022-08-18 NOTE — Telephone Encounter (Signed)
Dr. Dellis Anes please advise as to what other inhaler you would like him to try. His insurance is [employer] [place].  Patients mother called stating the prescription Budeson-Glycopyrrol-Formoterol (BREZTRI AEROSPHERE) 160-9-4.8 MCG/ACT AERO [[id number]] is too expensive cannot afford asking for another alternative please advise    [phone]

## 2022-08-18 NOTE — Telephone Encounter (Signed)
I will ask Trey Paula to check on this one as well.   Malachi Bonds, MD [facility]

## 2022-08-18 NOTE — Telephone Encounter (Signed)
Patients mother called stating the prescription Budeson-Glycopyrrol-Formoterol (BREZTRI AEROSPHERE) 160-9-4.8 MCG/ACT AERO [[id number]] is too expensive cannot afford asking for another alternative please advise

## 2022-08-19 NOTE — Telephone Encounter (Signed)
I talked to Trey Paula and Clydie Braun and they fixed it with the pharmacy!   Malachi Bonds, MD [facility]

## 2022-08-24 ENCOUNTER — Telehealth (HOSPITAL_COMMUNITY): Payer: Self-pay

## 2022-08-24 NOTE — Telephone Encounter (Signed)
Patient informed writer that he has been having increased side effects with Invega.  He notes that he has been having abnormal movements in his legs.  He also had informed writer that he feels that it is causing him to gain weight.  He notes that he has been eating more.  Patient also complains that it makes him feel fatigued.  Patient informed writer that he would like to trial other options to help manage his mental health conditions.  Provider schedule an appointment with patient on 08/27/2022 at 11:00.  No other concerns noted at this time.

## 2022-08-27 ENCOUNTER — Encounter (HOSPITAL_COMMUNITY): Payer: Self-pay

## 2022-08-27 ENCOUNTER — Ambulatory Visit (HOSPITAL_COMMUNITY): Payer: No Typology Code available for payment source

## 2022-08-27 ENCOUNTER — Ambulatory Visit (INDEPENDENT_AMBULATORY_CARE_PROVIDER_SITE_OTHER): Payer: No Typology Code available for payment source | Admitting: Psychiatry

## 2022-08-27 VITALS — BP 136/68 | HR 68 | Ht 73.0 in | Wt 330.0 lb

## 2022-08-27 DIAGNOSIS — F250 Schizoaffective disorder, bipolar type: Secondary | ICD-10-CM

## 2022-08-27 DIAGNOSIS — F900 Attention-deficit hyperactivity disorder, predominantly inattentive type: Secondary | ICD-10-CM

## 2022-08-27 DIAGNOSIS — G2401 Drug induced subacute dyskinesia: Secondary | ICD-10-CM

## 2022-08-27 DIAGNOSIS — G4700 Insomnia, unspecified: Secondary | ICD-10-CM

## 2022-08-27 DIAGNOSIS — F411 Generalized anxiety disorder: Secondary | ICD-10-CM

## 2022-08-27 MED ORDER — VALBENAZINE TOSYLATE 40 MG PO CAPS
40.0000 mg | ORAL_CAPSULE | Freq: Every day | ORAL | 3 refills | Status: DC
Start: 2022-08-27 — End: 2022-09-15

## 2022-08-27 MED ORDER — INVEGA HAFYERA 1092 MG/3.5ML IM SUSY
1092.0000 mg | PREFILLED_SYRINGE | INTRAMUSCULAR | 11 refills | Status: DC
Start: 2022-08-27 — End: 2022-09-15

## 2022-08-27 MED ORDER — BENZTROPINE MESYLATE 2 MG PO TABS: 2.0000 mg | ORAL_TABLET | Freq: Two times a day (BID) | ORAL | 2 refills | Status: DC

## 2022-08-27 NOTE — Progress Notes (Unsigned)
PT PRESENTS TO THE OFFICE FOR INVEGEA HAFYERA 1092 INJECTION , WAS GIVEN BY Bora Broner WITH NO COMPLAINTS OR ISSUES . PT WILL RETURN IN SIX MONTHS FOR NEXT DOSAGE

## 2022-08-27 NOTE — Progress Notes (Signed)
BH MD/PA/NP OP Progress Note    08/27/2022 1:22 PM Nathaniel [name]  MRN:  [id number]  Chief Complaint: "I have been having side effects with Invega"  HPI:  22 year old male seen today for follow-up psychiatric evaluation. He has a psychiatric history of substance use, tobacco dependence, schizoaffective disorder bipolar type, and anxiety.  He is currently managed on Invega Hafyera 1092 every 6 months, Strattera 40 mg daily, and cogentin 2 mg twice daily. He informed Clinical research associate that he discontinued Strattera. He informed Clinical research associate that his medications are somewhat effective in managing his psychiatric conditions.    Today he is well-groomed, pleasant, cooperative, engaged in conversation, maintaining eye contact.  He informed Clinical research associate that he has been having side effects with Invega.  He informed Clinical research associate that at times his muscles in his thighs moved abnormally.  He also describes shoulder rolling and abnormal muscle movements in his feet.  Patient notes that at times he feels that his face twitches.  Provider conducted an aims assessment however did not visibly see abnormal movements.  Patient however notes that it is interfering with his life.  Aims score of 4 given to patient based on his responses.   Patient informed Clinical research associate that his anxiety and depression are well-managed.  He notes that recently he has been worried about going back to school he notes that he has applied at [employer] and will be studying information technology.  He informed Clinical research associate that recently he left his job at [employer] because he felt it was too overwhelming.  Today he endorses adequate appetite and sleep.  He denies SI/HI/AVH, mania, paranoia.   Provider informed patient that Hinda Glatter can be lowered.  He reports that he did not experience symptoms of TD when he was on the monthly dose.  He however informed Clinical research associate that if his symptoms could be treated he will continue [facility] as he finds it most effective in treating his mental health. Today  patient agreeable to starting Ingrezza 40 mg daily to help manage symptoms of TD.  Provider informed patient that if Allena Earing is effective Cogentin will be tapered and discontinued.  He endorsed understanding and agreed. Potential side effects of medication and risks vs benefits of treatment vs non-treatment were explained and discussed. All questions were answered.  Today patient agreeable to continue [facility].  Provider also informed patient that if symptoms does not improve other antipsychotics such as Abilify could be trialed.  He endorsed understanding and agreed.  At this time he does not wish to restart Strattera and he notes that his mind is clear without it.  He will follow-up with writer in 2 months for further assessment.  No other concerns noted at this time.   Visit Diagnosis:    ICD-10-CM   1. Tardive dyskinesia  G24.01 valbenazine (INGREZZA) 40 MG capsule    2. Attention deficit hyperactivity disorder (ADHD), predominantly inattentive type  F90.0     3. Schizoaffective disorder, bipolar type  F25.0 benztropine (COGENTIN) 2 MG tablet    Paliperidone Palmitate ER (INVEGA HAFYERA) 1092 MG/3.5ML SUSY      Past Psychiatric History:  substance use, tobacco dependence, schizoaffective disorder bipolar type, and anxiety  Past Medical History:  Past Medical History:  Diagnosis Date   Allergic rhinitis    Chronic tonsillar hypertrophy    ENT did tonsillectomy 2020   Elevated blood pressure reading without diagnosis of hypertension    IFG (impaired fasting glucose) 12/2017   Gluc 107, HbA1c 5.5%   Obesity, Class II,  BMI 35-39.9    OSA (obstructive sleep apnea) 02/2018   Sleep study recommended by Dr. Ivory Broad considering it.   Severe persistent asthma    Dr. Sharyn Lull    Past Surgical History:  Procedure Laterality Date   TONSILLECTOMY  06/03/2018    Family Psychiatric History:  Denies  Family History:  Family History  Problem Relation Age of Onset   Heart attack Other     Prostate cancer Other    Hypertension Father    Hypertension Maternal Grandfather    Cancer Maternal Grandfather    Asthma Neg Hx     Social History:  Social History   Socioeconomic History   Marital status: Single    Spouse name: Not on file   Number of children: Not on file   Years of education: Not on file   Highest education level: Not on file  Occupational History   Not on file  Tobacco Use   Smoking status: Never    Passive exposure: Never   Smokeless tobacco: Never  Vaping Use   Vaping Use: Never used  Substance and Sexual Activity   Alcohol use: No   Drug use: No   Sexual activity: Yes  Other Topics Concern   Not on file  Social History Narrative   Single, lives with mom in [place].   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   No T/A/Ds.   Social Determinants of Health   Financial Resource Strain: Low Risk  (11/10/2021)   Overall Financial Resource Strain (CARDIA)    Difficulty of Paying Living Expenses: Not hard at all  Food Insecurity: No Food Insecurity (11/10/2021)   Hunger Vital Sign    Worried About Running Out of Food in the Last Year: Never true    Ran Out of Food in the Last Year: Never true  Transportation Needs: No Transportation Needs (11/10/2021)   PRAPARE - Administrator, Civil Service (Medical): No    Lack of Transportation (Non-Medical): No  Physical Activity: Sufficiently Active (11/10/2021)   Exercise Vital Sign    Days of Exercise per Week: 6 days    Minutes of Exercise per Session: 30 min  Stress: Stress Concern Present (11/10/2021)   [employer] of Occupational Health - Occupational Stress Questionnaire    Feeling of Stress : Very much  Social Connections: Socially Isolated (11/10/2021)   Social Connection and Isolation Panel [NHANES]    Frequency of Communication with Friends and Family: More than three times a week    Frequency of Social Gatherings with Friends and Family: More than  three times a week    Attends Religious Services: Never    Database administrator or Organizations: No    Attends Banker Meetings: Never    Marital Status: Never married    Allergies:  Allergies  Allergen Reactions   Amoxicillin Shortness Of Breath, Swelling and Other (See Comments)    Metabolic Disorder Labs: Lab Results  Component Value Date   HGBA1C 5.5 03/14/2020   MPG 111.15 03/14/2020   MPG 114 08/27/2014   No results found for: "PROLACTIN" Lab Results  Component Value Date   CHOL 180 03/14/2020   TRIG 136 03/14/2020   HDL 33 (L) 03/14/2020   CHOLHDL 5.5 03/14/2020   VLDL 27 03/14/2020   LDLCALC 120 (H) 03/14/2020   LDLCALC 92 02/28/2019   Lab Results  Component Value Date   TSH 2.519 03/15/2020   TSH 5.616 (  H) 03/14/2020    Therapeutic Level Labs: No results found for: "LITHIUM" No results found for: "VALPROATE" No results found for: "CBMZ"  Current Medications: Current Outpatient Medications  Medication Sig Dispense Refill   valbenazine (INGREZZA) 40 MG capsule Take 1 capsule (40 mg total) by mouth daily. 30 capsule 3   albuterol (VENTOLIN HFA) 108 (90 Base) MCG/ACT inhaler Inhale 2 puffs into the lungs every 4 (four) hours as needed for wheezing or shortness of breath. 18 each 1   benztropine (COGENTIN) 2 MG tablet Take 1 tablet (2 mg total) by mouth 2 (two) times daily. 180 tablet 2   Budeson-Glycopyrrol-Formoterol (BREZTRI AEROSPHERE) 160-9-4.8 MCG/ACT AERO INHALE 2 PUFFS INTO THE LUNGS TWICE A DAY 10.7 g 5   cetirizine (ZYRTEC) 10 MG tablet Take 1 tablet (10 mg total) by mouth daily. 90 tablet 1   Cholecalciferol (VITAMIN D PO) Take 2,000 Units by mouth daily.     cyclobenzaprine (FLEXERIL) 10 MG tablet Take 1 tablet (10 mg total) by mouth 2 (two) times daily as needed for muscle spasms. 30 tablet 0   metoprolol succinate (TOPROL-XL) 50 MG 24 hr tablet Take 50 mg by mouth daily.     Olopatadine-Mometasone (RYALTRIS) [id number] MCG/ACT SUSP  Place 2 sprays into the nose 2 (two) times daily. 29 g 5   Omega-3 Fatty Acids (FISH OIL TRIPLE STRENGTH) 1360 MG CAPS Take 1 capsule by mouth daily.     Paliperidone Palmitate ER (INVEGA HAFYERA) 1092 MG/3.5ML SUSY Inject 1,092 mg into the muscle every 6 (six) months. 1092 mL 11   WEGOVY 0.25 MG/0.5ML SOAJ Inject 0.25 mg into the skin once a week.     WEGOVY 1 MG/0.5ML SOAJ Inject 1 mg into the skin once a week.     No current facility-administered medications for this visit.     Musculoskeletal: Strength & Muscle Tone: within normal limits Gait & Station: normal Patient leans: N/A  Psychiatric Specialty Exam: Review of Systems  There were no vitals taken for this visit.There is no height or weight on file to calculate BMI.  General Appearance: Well Groomed  Eye Contact:  Good  Speech:  Clear and Coherent and Normal Rate  Volume:  Normal  Mood:  Euthymic,   Affect:  Appropriate and Congruent  Thought Process:  Coherent, Goal Directed, and Linear  Orientation:  Full (Time, Place, and Person)  Thought Content: WDL and Logical   Suicidal Thoughts:  No  Homicidal Thoughts:  No  Memory:  Immediate;   Good Recent;   Good Remote;   Good  Judgement:  Good  Insight:  Good  Psychomotor Activity:  Normal  Concentration:  Concentration: Good and Attention Span: Good  Recall:  Good  Fund of Knowledge: Good  Language: Good  Akathisia:  No  Handed:  Right  AIMS (if indicated): done, 4 no noticeable movements noticed by Clinical research associate. Patient however reports abnormal movements occurring   Assets:  Communication Skills Desire for Improvement Financial Resources/Insurance Housing Physical Health Social Support  ADL's:  Intact  Cognition: WNL  Sleep:  Good   Screenings: AIMS    Flowsheet Row Office Visit from 08/27/2022 in [facility] Office Visit from 05/28/2022 in [facility] Office Visit from 02/26/2022 in [facility] Admission (Discharged[facility]  AIMS Total Score 4 0 1 0      AUDIT    Flowsheet Row Admission (  Discharged) from [facility]  Alcohol Use Disorder Identification Test Final Score (AUDIT) 1      GAD-7    Flowsheet Row Office Visit from 08/27/2022 in [facility] Office Visit from 05/28/2022 in [facility] Office Visit from 02/26/2022 in [facility] Video Visit from 02/05/2022 in [facility] Video Visit from 01/21/2022 in [facility]  Total GAD-7 Score 5 0 5 10 10       PHQ2-9    Flowsheet Row Office Visit from 08/27/2022 in [facility] Office Visit from 05/28/2022 in [facility] Office Visit from 02/26/2022 in [facility] Video Visit from 02/05/2022 in [facility] Video Visit from 01/21/2022 in [facility]  PHQ-2 Total Score 3 0 2 4 2   PHQ-9 Total Score 12 -- 10 16 12       Flowsheet Row Office Visit from 05/28/2022 in [facility] Office Visit from 02/26/2022 in [facility] Video Visit from 01/21/2022 in [facility]  C-SSRS RISK CATEGORY No Risk Error: Question 6 not populated Error: Q7 should not be populated when Q6 is No        Assessment and Plan: Patient reports that he symptoms of TD.  Provider informed patient that Hinda Glatter can be lowered.  He reports that he did not experience symptoms of TD when he was on the monthly dose.  He however informed Clinical research associate that if his symptoms could be treated he will continue [facility] as he finds it most effective in treating  his mental health. Today patient agreeable to starting Ingrezza 40 mg daily to help manage symptoms of TD.  Provider informed patient that if Allena Earing is effective Cogentin will be tapered and discontinued.  He endorsed understanding and agreed.  Today patient agreeable to continue [facility].  Provider informed patient that other antipsychotics like Abilify can be trialed in the future if needed.  She endorsed understanding and agreed.  At this time he does not wish to restart Ingrezza as he notes that his mind is clear without it.    1. Attention deficit hyperactivity disorder (ADHD), predominantly inattentive type   2. Schizoaffective disorder, bipolar type  Continue- benztropine (COGENTIN) 2 MG tablet; Take 1 tablet (2 mg total) by mouth 2 (two) times daily.  Dispense: 180 tablet; Refill: 2 Continue- Paliperidone Palmitate ER (INVEGA HAFYERA) 1092 MG/3.5ML SUSY; Inject 1,092 mg into the muscle every 6 (six) months.  Dispense: 1092 mL; Refill: 11   3. Tardive dyskinesia  Continue- valbenazine (INGREZZA) 40 MG capsule; Take 1 capsule (40 mg total) by mouth daily.  Dispense: 30 capsule; Refill: 3     Follow-up in 2 months Follow-up therapy Shanna Cisco, NP 08/27/2022, 1:22 PM

## 2022-09-03 ENCOUNTER — Ambulatory Visit: Payer: No Typology Code available for payment source | Admitting: Neurology

## 2022-09-07 ENCOUNTER — Encounter (HOSPITAL_BASED_OUTPATIENT_CLINIC_OR_DEPARTMENT_OTHER): Payer: Self-pay | Admitting: Emergency Medicine

## 2022-09-07 ENCOUNTER — Emergency Department (HOSPITAL_BASED_OUTPATIENT_CLINIC_OR_DEPARTMENT_OTHER)
Admission: EM | Admit: 2022-09-07 | Discharge: 2022-09-07 | Disposition: A | Discharge status: Home / Self Care | Payer: No Typology Code available for payment source | Attending: Emergency Medicine | Admitting: Emergency Medicine

## 2022-09-07 ENCOUNTER — Other Ambulatory Visit: Payer: Self-pay

## 2022-09-07 DIAGNOSIS — Z7951 Long term (current) use of inhaled steroids: Secondary | ICD-10-CM | POA: Insufficient documentation

## 2022-09-07 DIAGNOSIS — R000 Tachycardia, unspecified: Secondary | ICD-10-CM | POA: Diagnosis not present

## 2022-09-07 DIAGNOSIS — R0602 Shortness of breath: Secondary | ICD-10-CM | POA: Diagnosis present

## 2022-09-07 DIAGNOSIS — J45901 Unspecified asthma with (acute) exacerbation: Secondary | ICD-10-CM

## 2022-09-07 DIAGNOSIS — F17210 Nicotine dependence, cigarettes, uncomplicated: Secondary | ICD-10-CM | POA: Diagnosis not present

## 2022-09-07 LAB — CBG MONITORING, ED: Glucose-Capillary: 99 mg/dL (ref 70–99)

## 2022-09-07 MED ORDER — PREDNISONE 20 MG PO TABS: 40.0000 mg | ORAL_TABLET | Freq: Every day | ORAL | 0 refills | Status: DC

## 2022-09-07 MED ORDER — PREDNISONE 50 MG PO TABS
60.0000 mg | ORAL_TABLET | Freq: Once | ORAL | Status: AC
  Administered 2022-09-07: 60 mg via ORAL
  Filled 2022-09-07: qty 1

## 2022-09-07 MED ORDER — ALBUTEROL SULFATE (2.5 MG/3ML) 0.083% IN NEBU
2.5000 mg | INHALATION_SOLUTION | Freq: Once | RESPIRATORY_TRACT | Status: AC
  Administered 2022-09-07: 2.5 mg via RESPIRATORY_TRACT
  Filled 2022-09-07: qty 3

## 2022-09-07 NOTE — ED Provider Notes (Signed)
[place] EMERGENCY DEPARTMENT AT [facility] Provider Note   CSN: [id number] Arrival date & time: 09/07/22  1708     History  Chief Complaint  Patient presents with   Asthma   Shortness of Breath    Nathaniel [name] is a 22 y.o. male.  Patient presents to the emergency department today for evaluation of shortness of breath and wheezing.  Patient has a history of asthma.  He states that he recently celebrated birthday and smokes some cigarettes.  He has had increasing shortness of breath with some wheezing.  He has tried his home albuterol inhaler without improvement.  He denies fever, nausea or vomiting.  Denies other respiratory insults.  Symptoms started about 3 to 4 days ago.       Home Medications Prior to Admission medications   Medication Sig Start Date End Date Taking? Authorizing Provider  albuterol (VENTOLIN HFA) 108 (90 Base) MCG/ACT inhaler Inhale 2 puffs into the lungs every 4 (four) hours as needed for wheezing or shortness of breath. 08/13/22   Alfonse Spruce, MD  benztropine (COGENTIN) 2 MG tablet Take 1 tablet (2 mg total) by mouth 2 (two) times daily. 08/27/22   Shanna Cisco, NP  Budeson-Glycopyrrol-Formoterol (BREZTRI AEROSPHERE) 160-9-4.8 MCG/ACT AERO INHALE 2 PUFFS INTO THE LUNGS TWICE A DAY 08/14/22   Alfonse Spruce, MD  cetirizine (ZYRTEC) 10 MG tablet Take 1 tablet (10 mg total) by mouth daily. 08/13/22   Alfonse Spruce, MD  Cholecalciferol (VITAMIN D PO) Take 2,000 Units by mouth daily.    [provider]  cyclobenzaprine (FLEXERIL) 10 MG tablet Take 1 tablet (10 mg total) by mouth 2 (two) times daily as needed for muscle spasms. 10/21/21   Janell Quiet, PA-C  metoprolol succinate (TOPROL-XL) 50 MG 24 hr tablet Take 50 mg by mouth daily. 07/28/21   [provider]  Olopatadine-Mometasone (Cristal Generous) [phone] MCG/ACT SUSP Place 2 sprays into the nose 2 (two) times daily. 08/13/22   Alfonse Spruce, MD   Omega-3 Fatty Acids (FISH OIL TRIPLE STRENGTH) 1360 MG CAPS Take 1 capsule by mouth daily. 10/27/21   [provider]  Paliperidone Palmitate ER (INVEGA HAFYERA) 1092 MG/3.5ML SUSY Inject 1,092 mg into the muscle every 6 (six) months. 08/27/22   Shanna Cisco, NP  valbenazine [facility]) 40 MG capsule Take 1 capsule (40 mg total) by mouth daily. 08/27/22   Shanna Cisco, NP  WEGOVY 0.25 MG/0.5ML SOAJ Inject 0.25 mg into the skin once a week.    [provider]  WEGOVY 1 MG/0.5ML SOAJ Inject 1 mg into the skin once a week. 08/10/22   [provider]      Allergies    Amoxicillin    Review of Systems   Review of Systems  Physical Exam Updated Vital Signs BP (!) 144/102 (BP Location: Right Arm)   Pulse (!) 103   Temp 97.8 F (36.6 C) (Temporal)   Resp 15   Ht 6\' 1"  (1.854 m)   Wt (!) 148.3 kg   SpO2 99%   BMI 43.14 kg/m  Physical Exam Vitals and nursing note reviewed.  Constitutional:      General: He is not in acute distress.    Appearance: He is well-developed.  HENT:     Head: Normocephalic and atraumatic.  Eyes:     General:        Right eye: No discharge.        Left eye: No discharge.  Conjunctiva/sclera: Conjunctivae normal.  Cardiovascular:     Rate and Rhythm: Regular rhythm. Tachycardia present.     Heart sounds: Normal heart sounds.     Comments: Mild tachycardia Pulmonary:     Effort: Pulmonary effort is normal.     Breath sounds: Wheezing (Scant, end expiratory) present.     Comments: Slightly diminished lung sounds bilaterally Abdominal:     Palpations: Abdomen is soft.     Tenderness: There is no abdominal tenderness.  Musculoskeletal:     Cervical back: Normal range of motion and neck supple.  Skin:    General: Skin is warm and dry.  Neurological:     Mental Status: He is alert.     ED Results / Procedures / Treatments   Labs (all labs ordered are listed, but only abnormal results are displayed) Labs  Reviewed  CBG MONITORING, ED    EKG None  Radiology No results found.  Procedures Procedures    Medications Ordered in ED Medications  predniSONE (DELTASONE) tablet 60 mg (has no administration in time range)  albuterol (PROVENTIL) (2.5 MG/3ML) 0.083% nebulizer solution 2.5 mg (2.5 mg Nebulization Given 09/07/22 1730)    ED Course/ Medical Decision Making/ A&P    Patient seen and examined. History obtained directly from patient.  He looks well, no accessory muscle use or respiratory distress.  RT starting albuterol treatment.  Labs/EKG: None ordered  Imaging: None ordered  Medications/Fluids: Ordered: Albuterol nebulizer, prednisone p.o. 60 mg.   Most recent vital signs reviewed and are as follows: BP (!) 157/74   Pulse (!) 108   Temp 97.8 F (36.6 C) (Temporal)   Resp 15   Ht 6\' 1"  (1.854 m)   Wt (!) 148.3 kg   SpO2 95%   BMI 43.14 kg/m   Initial impression: Asthma exacerbation  6:26 PM Reassessment performed. Patient appears stable.  He is up in the room.  Lungs are more clear now and with better air movement.  Patient states that he is feeling better.  Reviewed pertinent lab work and imaging with patient at bedside. Questions answered.   Most current vital signs reviewed and are as follows: BP (!) 157/74   Pulse (!) 108   Temp 97.8 F (36.6 C) (Temporal)   Resp 15   Ht 6\' 1"  (1.854 m)   Wt (!) 148.3 kg   SpO2 95%   BMI 43.14 kg/m   Plan: Discharge to home.   Prescriptions written for: Prednisone  Other home care instructions discussed: Use of albuterol inhaler at home, avoidance of triggers  ED return instructions discussed: Worsening shortness of breath, increased work of breathing, high fever, new symptoms or other concerns.  Follow-up instructions discussed: Patient encouraged to follow-up with their PCP in 3 days if not improving.                                Medical Decision Making Risk Prescription drug management.   Patient  with suspected asthma exacerbation in setting of recent smoke exposure.  Patient otherwise looks well.  No significant chest pain.  Improved with albuterol treatment here in the emergency department.  Patient started on prednisone.  Low concern for pneumonia or infectious etiology.  Low concern for ACS, PE.        Final Clinical Impression(s) / ED Diagnoses Final diagnoses:  Exacerbation of asthma, unspecified asthma severity, unspecified whether persistent    Rx / DC Orders ED  Discharge Orders          Ordered    predniSONE (DELTASONE) 20 MG tablet  Daily        09/07/22 1825              Renne Crigler, Cordelia Poche 09/07/22 Dorann Lodge, MD 09/07/22 2324

## 2022-09-07 NOTE — Discharge Instructions (Signed)
Please read and follow all provided instructions.  Your diagnoses today include:  1. Exacerbation of asthma, unspecified asthma severity, unspecified whether persistent    Tests performed today include: Vital signs. See below for your results today.   Medications prescribed:  Prednisone - steroid medicine   It is best to take this medication in the morning to prevent sleeping problems. If you are diabetic, monitor your blood sugar closely and stop taking Prednisone if blood sugar is over 300. Take with food to prevent stomach upset.   Take any prescribed medications only as directed.  Home care instructions:  Follow any educational materials contained in this packet.  Use your albuterol inhaler every 4 hours over the next day to help continue to control symptoms  Follow-up instructions: Please follow-up with your primary care provider in the next 3 days for further evaluation of your symptoms and management of your asthma.  Return instructions:  Please return to the Emergency Department if you experience worsening symptoms. Please return with worsening wheezing, shortness of breath, or difficulty breathing. Return with persistent fever above 101F.  Please return if you have any other emergent concerns.  Additional Information:  Your vital signs today were: BP (!) 157/74   Pulse (!) 108   Temp 97.8 F (36.6 C) (Temporal)   Resp 15   Ht 6\' 1"  (1.854 m)   Wt (!) 148.3 kg   SpO2 95%   BMI 43.14 kg/m  If your blood pressure (BP) was elevated above 135/85 this visit, please have this repeated by your doctor within one month. --------------

## 2022-09-07 NOTE — ED Triage Notes (Signed)
Pt arrives to ED with c/o asthma exacerbation and SOB x3-4 days. He notes using albuterol inhaler x2.

## 2022-09-09 ENCOUNTER — Ambulatory Visit (HOSPITAL_COMMUNITY)
Admission: EM | Admit: 2022-09-09 | Discharge: 2022-09-09 | Disposition: A | Discharge status: Home / Self Care | Payer: No Typology Code available for payment source | Attending: Psychiatry | Admitting: Psychiatry

## 2022-09-09 DIAGNOSIS — G2581 Restless legs syndrome: Secondary | ICD-10-CM | POA: Diagnosis not present

## 2022-09-09 DIAGNOSIS — F411 Generalized anxiety disorder: Secondary | ICD-10-CM | POA: Diagnosis not present

## 2022-09-09 MED ORDER — HYDROXYZINE HCL 25 MG PO TABS
50.0000 mg | ORAL_TABLET | ORAL | Status: AC
  Administered 2022-09-09: 50 mg via ORAL
  Filled 2022-09-09: qty 2

## 2022-09-09 NOTE — Progress Notes (Signed)
   09/09/22 2011  [facility] Triage Screening (Walk-ins at [facility] only)  How Did You Hear About [place]? Family/Friend  What Is the Reason for Your Visit/Call Today? Pt presents to [facility] voluntarily, accompanied by his mother with complaint of anxiety and restlessness. Pt reports feeling like he could not relax and go to bed even when he is tired. Pt also reports that he recently smoked a lot of cigarettes and went to the hospital for a breathing treatment and has not really felt the same since. Pt reports history of schizoaffective disorder and is prescribed Invega to manage his diagnosis. Pt is linked to outpatient therapy services through [facility]. Pt denies SI, HI, AVH and substance/alcohol use.  How Long Has This Been Causing You Problems? <Week  Have You Recently Had Any Thoughts About Hurting Yourself? No  Are You Planning to Commit Suicide/Harm Yourself At This time? No  Have you Recently Had Thoughts About Hurting Someone Karolee Ohs? No  Are You Planning To Harm Someone At This Time? No  Are you currently experiencing any auditory, visual or other hallucinations? No  Have You Used Any Alcohol or Drugs in the Past 24 Hours? No  Do you have any current medical co-morbidities that require immediate attention? No  Clinician description of patient physical appearance/behavior: Pt is calm, cooperative, oriented  What Do You Feel Would Help You the Most Today? Treatment for Depression or other mood problem;Medication(s)  Determination of Need Routine (7 days)  Options For Referral Other: Comment;Outpatient Therapy;Medication Management

## 2022-09-09 NOTE — ED Provider Notes (Signed)
Behavioral Health Urgent Care Medical Screening Exam  Patient Name: Nathaniel [name] MRN: [id number] Date of Evaluation: 09/10/22 Chief Complaint:  dealing with lot of restlessness and anxiety Diagnosis:  Final diagnoses:  Generalized anxiety disorder  Restless leg    History of Present illness: Nathaniel [name] is a 22 y.o. male. With a history of schizoaffective disorder, substance use disorder, and anxiety.  A review of patient's records shows that patient was seen in the ED 2 days ago for shortness of breath.  Presented to [facility] voluntarily per the patient he has been having increased restless leg and increased anxiety.  Per the patient he sees a provider at the walk-in psychiatry for med management.  According to patient this increased anxiety has been going on for the past 6 nights.  Patient is currently on Invega, patient lives at home with his mom.  Patient is unemployed.  According to patient he drinks alcohol mostly on the weekends 3-4 beers and smokes cigarettes.  Copied from triage notes: [name]- presents to [facility] voluntarily, accompanied by his mother with complaint of anxiety and restlessness. Pt reports feeling like he could not relax and go to bed even when he is tired. Pt also reports that he recently smoked a lot of cigarettes and went to the hospital for a breathing treatment and has not really felt the same since. Pt reports history of schizoaffective disorder and is prescribed Invega to manage his diagnosis. Pt is linked to outpatient therapy services through [facility]. Pt denies SI, HI, AVH and substance/alcohol use   Face-to-face observation of patient, patient is alert and oriented x 4, speech is clear, maintaining eye contact.  Patient denies SI, HI, AVH or paranoia at this time.  Patient reported he drinks alcohol only on the weekends 3-4 beers, reported he smokes cigarettes.  Denies any other illicit drug use.  Patient reports he sleeps 7 to 9 hours each night.  At  this time patient does not seem to be influenced by external or internal stimuli.  Offered patient a one-time dose of hydroxyzine.  Discussed with patient to reach out to their provider tomorrow to see if the provider wants to continue the hydroxyzine or want to add something to the regiment.  Patient in agreement with plan.   Recommend discharge for follow-up with psych medication provider.  Will also give 1 dose of hydroxyzine 50 mg.  Flowsheet Row ED from 09/09/2022 in [facility] ED from 09/07/2022 in [facility] Emergency Department at [facility] Office Visit from 05/28/2022 in [facility]  C-SSRS RISK CATEGORY No Risk No Risk No Risk       Psychiatric Specialty Exam  Presentation  General Appearance:Casual  Eye Contact:Good  Speech:Clear and Coherent  Speech Volume:Normal  Handedness:Right   Mood and Affect  Mood: Anxious  Affect: Appropriate   Thought Process  Thought Processes: Coherent  Descriptions of Associations:Circumstantial  Orientation:Full (Time, Place and Person)  Thought Content:Logical  Diagnosis of Schizophrenia or Schizoaffective disorder in past: Yes  Duration of Psychotic Symptoms: Greater than six months  Hallucinations:None  Ideas of Reference:None  Suicidal Thoughts:No  Homicidal Thoughts:No   Sensorium  Memory: Immediate Good  Judgment: Fair  Insight: Good   Executive Functions  Concentration: Good  Attention Span: Good  Recall: Good  Fund of Knowledge: Good  Language: Good   Psychomotor Activity  Psychomotor Activity: Normal   Assets  Assets: Communication Skills   Sleep  Sleep: Good  Number  of hours:  7   Physical Exam: Physical Exam HENT:     Head: Normocephalic.     Nose: Nose normal.  Cardiovascular:     Rate and Rhythm: Tachycardia present.  Pulmonary:     Effort: Pulmonary effort is normal.  Musculoskeletal:         General: Normal range of motion.     Cervical back: Normal range of motion.  Neurological:     General: No focal deficit present.     Mental Status: He is alert.  Psychiatric:        Mood and Affect: Mood normal.    Review of Systems  Constitutional: Negative.   HENT: Negative.    Eyes: Negative.   Respiratory: Negative.    Cardiovascular: Negative.   Gastrointestinal: Negative.   Genitourinary: Negative.   Musculoskeletal: Negative.   Skin: Negative.   Neurological: Negative.   Psychiatric/Behavioral:  The patient is nervous/anxious.    Blood pressure 137/85, pulse (!) 105, temperature 98.3 F (36.8 C), temperature source Oral, resp. rate 20, SpO2 98 %. There is no height or weight on file to calculate BMI.  Musculoskeletal: Strength & Muscle Tone: within normal limits Gait & Station: normal Patient leans: N/A   [facility] MSE Discharge Disposition for Follow up and Recommendations: Based on my evaluation the patient does not appear to have an emergency medical condition and can be discharged with resources and follow up care in outpatient services for Medication Management and Individual Therapy   Sindy Guadeloupe, NP 09/10/2022, 5:57 AM

## 2022-09-09 NOTE — Discharge Instructions (Addendum)
F/u with walk-in psychiatry  

## 2022-09-10 DIAGNOSIS — I2699 Other pulmonary embolism without acute cor pulmonale: Secondary | ICD-10-CM

## 2022-09-10 HISTORY — DX: Other pulmonary embolism without acute cor pulmonale: I26.99

## 2022-09-15 ENCOUNTER — Telehealth (HOSPITAL_COMMUNITY): Payer: Self-pay | Admitting: *Deleted

## 2022-09-15 ENCOUNTER — Encounter: Payer: Self-pay | Admitting: Allergy & Immunology

## 2022-09-15 ENCOUNTER — Telehealth (INDEPENDENT_AMBULATORY_CARE_PROVIDER_SITE_OTHER): Payer: No Typology Code available for payment source | Admitting: Psychiatry

## 2022-09-15 ENCOUNTER — Encounter (HOSPITAL_COMMUNITY): Payer: Self-pay | Admitting: Psychiatry

## 2022-09-15 ENCOUNTER — Other Ambulatory Visit: Payer: Self-pay

## 2022-09-15 ENCOUNTER — Ambulatory Visit (INDEPENDENT_AMBULATORY_CARE_PROVIDER_SITE_OTHER): Payer: No Typology Code available for payment source | Admitting: Allergy & Immunology

## 2022-09-15 VITALS — BP 110/70 | HR 98 | Temp 98.4°F | Resp 24 | Ht 73.0 in | Wt 333.4 lb

## 2022-09-15 DIAGNOSIS — G2401 Drug induced subacute dyskinesia: Secondary | ICD-10-CM

## 2022-09-15 DIAGNOSIS — F250 Schizoaffective disorder, bipolar type: Secondary | ICD-10-CM | POA: Diagnosis not present

## 2022-09-15 DIAGNOSIS — J4550 Severe persistent asthma, uncomplicated: Secondary | ICD-10-CM | POA: Diagnosis not present

## 2022-09-15 DIAGNOSIS — F411 Generalized anxiety disorder: Secondary | ICD-10-CM

## 2022-09-15 DIAGNOSIS — J3089 Other allergic rhinitis: Secondary | ICD-10-CM | POA: Diagnosis not present

## 2022-09-15 MED ORDER — INVEGA HAFYERA 1092 MG/3.5ML IM SUSY: 1092.0000 mg | PREFILLED_SYRINGE | INTRAMUSCULAR | 11 refills | Status: DC

## 2022-09-15 MED ORDER — BREZTRI AEROSPHERE 160-9-4.8 MCG/ACT IN AERO: INHALATION_SPRAY | RESPIRATORY_TRACT | 5 refills | Status: DC

## 2022-09-15 MED ORDER — BENZTROPINE MESYLATE 2 MG PO TABS: 2.0000 mg | ORAL_TABLET | Freq: Two times a day (BID) | ORAL | 2 refills | Status: DC

## 2022-09-15 MED ORDER — VALBENAZINE TOSYLATE 40 MG PO CAPS: 40.0000 mg | ORAL_CAPSULE | Freq: Every day | ORAL | 3 refills | Status: DC

## 2022-09-15 MED ORDER — BUSPIRONE HCL 10 MG PO TABS
10.0000 mg | ORAL_TABLET | Freq: Three times a day (TID) | ORAL | 3 refills | Status: DC
Start: 2022-09-15 — End: 2022-09-30

## 2022-09-15 NOTE — Progress Notes (Addendum)
BH MD/PA/NP OP Progress Note  Virtual Visit via Video Note  I connected with Nathaniel [name] on 09/21/22 at  3:00 PM EDT by a video enabled telemedicine application and verified that I am speaking with the correct person using two identifiers.  Location: Patient: Home Provider: Clinic   I discussed the limitations of evaluation and management by telemedicine and the availability of in person appointments. The patient expressed understanding and agreed to proceed.  I provided 30 minutes of non-face-to-face time during this encounter.    09/15/2022 3:33 PM Nathaniel [name]  MRN:  [id number]  Chief Complaint: "My anxiety is triggered by Schizoaffective disorder"  HPI:  22 year old male seen today for follow-up psychiatric evaluation. He has a psychiatric history of substance use, tobacco dependence, schizoaffective disorder bipolar type, and anxiety.  He is currently managed on Invega Hafyera 1092 every 6 months, Ingrezza 40 mg daily, and cogentin 2 mg twice daily. Recently patient reports that he was prescribed BuSpar 5 mg by his PCP. He informed Clinical research associate that his medications are somewhat effective in managing his psychiatric conditions.    Today he is well-groomed, pleasant, cooperative, engaged in conversation, maintaining eye contact.  He informed Clinical research associate that his anxiety is triggered by his schizoaffective disorder.  Patient was recently seen at [facility] on 09/09/2022 increased anxiety.  He informed Clinical research associate that he is uncertain what caused his anxiety attacl.  He notes that it potentially could have been due to having an asthma attack and becoming short of breath.  Today provider conducted GAD-7 and patient scored a 7. Provider also conducted PHQ-9 patient scored an 8.  He notes that his sleep fluctuates reporting that he sleeps 6 to 9 hours nightly.  Patient reports he has reduced appetite but denies weight loss.  Today he denies SI/HI/AVH or paranoia.  Patient informed Clinical research associate that he recently got glasses  as he was seeing floating white spots.  He notes that he continues to see floating white spots and shadows.  Patient reports that he completed his application at [employer] and will be studying information technology.  He reports that he is looking forward to this and returning.  Today BuSpar 5 mg increased to 10 mg 3 times daily to help manage anxiety and depression.  Patient asked writer if he should be on a mood stabilizer.  Patient did not endorses symptoms of mania.  At this time provider recommended that a mood stabilizer does not be started.  He endorsed understanding and agreed.  Patient notes that he is not started Ingrezza. Provider informed patient that if Allena Earing is effective Cogentin will be tapered and discontinued.  He endorsed understanding and agreed. Potential side effects of medication and risks vs benefits of treatment vs non-treatment were explained and discussed. All questions were answered.  Patient's next visit will be in person.  No other concerns noted at this time.   Visit Diagnosis:    ICD-10-CM   1. Generalized anxiety disorder  F41.1 busPIRone (BUSPAR) 10 MG tablet    2. Schizoaffective disorder, bipolar type (HCC)  F25.0 benztropine (COGENTIN) 2 MG tablet    Paliperidone Palmitate ER (INVEGA HAFYERA) 1092 MG/3.5ML SUSY    3. Tardive dyskinesia  G24.01 valbenazine (INGREZZA) 40 MG capsule      Past Psychiatric History:  substance use, tobacco dependence, schizoaffective disorder bipolar type, and anxiety  Past Medical History:  Past Medical History:  Diagnosis Date   Allergic rhinitis    Chronic tonsillar hypertrophy    ENT did tonsillectomy 2020  Elevated blood pressure reading without diagnosis of hypertension    IFG (impaired fasting glucose) 12/2017   Gluc 107, HbA1c 5.5%   Obesity, Class II, BMI 35-39.9    OSA (obstructive sleep apnea) 02/2018   Sleep study recommended by Dr. Ivory Broad considering it.   Severe persistent asthma    Dr. Sharyn Lull    Past  Surgical History:  Procedure Laterality Date   TONSILLECTOMY  06/03/2018    Family Psychiatric History:  Denies  Family History:  Family History  Problem Relation Age of Onset   Heart attack Other    Prostate cancer Other    Hypertension Father    Hypertension Maternal Grandfather    Cancer Maternal Grandfather    Asthma Neg Hx     Social History:  Social History   Socioeconomic History   Marital status: Single    Spouse name: Not on file   Number of children: Not on file   Years of education: Not on file   Highest education level: Not on file  Occupational History   Not on file  Tobacco Use   Smoking status: Never    Passive exposure: Never   Smokeless tobacco: Never  Vaping Use   Vaping Use: Never used  Substance and Sexual Activity   Alcohol use: No   Drug use: No   Sexual activity: Yes  Other Topics Concern   Not on file  Social History Narrative   Single, lives with mom in [place].   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   No T/A/Ds.   Social Determinants of Health   Financial Resource Strain: Low Risk  (11/10/2021)   Overall Financial Resource Strain (CARDIA)    Difficulty of Paying Living Expenses: Not hard at all  Food Insecurity: No Food Insecurity (11/10/2021)   Hunger Vital Sign    Worried About Running Out of Food in the Last Year: Never true    Ran Out of Food in the Last Year: Never true  Transportation Needs: No Transportation Needs (11/10/2021)   PRAPARE - Administrator, Civil Service (Medical): No    Lack of Transportation (Non-Medical): No  Physical Activity: Sufficiently Active (11/10/2021)   Exercise Vital Sign    Days of Exercise per Week: 6 days    Minutes of Exercise per Session: 30 min  Stress: Stress Concern Present (11/10/2021)   [employer] of Occupational Health - Occupational Stress Questionnaire    Feeling of Stress : Very much  Social Connections: Socially Isolated  (11/10/2021)   Social Connection and Isolation Panel [NHANES]    Frequency of Communication with Friends and Family: More than three times a week    Frequency of Social Gatherings with Friends and Family: More than three times a week    Attends Religious Services: Never    Database administrator or Organizations: No    Attends Banker Meetings: Never    Marital Status: Never married    Allergies:  Allergies  Allergen Reactions   Amoxicillin Shortness Of Breath, Swelling and Other (See Comments)    Metabolic Disorder Labs: Lab Results  Component Value Date   HGBA1C 5.5 03/14/2020   MPG 111.15 03/14/2020   MPG 114 08/27/2014   No results found for: "PROLACTIN" Lab Results  Component Value Date   CHOL 180 03/14/2020   TRIG 136 03/14/2020   HDL 33 (L) 03/14/2020   CHOLHDL 5.5 03/14/2020   VLDL 27 03/14/2020  LDLCALC 120 (H) 03/14/2020   LDLCALC 92 02/28/2019   Lab Results  Component Value Date   TSH 2.519 03/15/2020   TSH 5.616 (H) 03/14/2020    Therapeutic Level Labs: No results found for: "LITHIUM" No results found for: "VALPROATE" No results found for: "CBMZ"  Current Medications: Current Outpatient Medications  Medication Sig Dispense Refill   albuterol (VENTOLIN HFA) 108 (90 Base) MCG/ACT inhaler Inhale 2 puffs into the lungs every 4 (four) hours as needed for wheezing or shortness of breath. 18 each 1   benztropine (COGENTIN) 2 MG tablet Take 1 tablet (2 mg total) by mouth 2 (two) times daily. 180 tablet 2   Budeson-Glycopyrrol-Formoterol (BREZTRI AEROSPHERE) 160-9-4.8 MCG/ACT AERO INHALE 2 PUFFS INTO THE LUNGS TWICE A DAY 10.7 g 5   busPIRone (BUSPAR) 10 MG tablet Take 1 tablet (10 mg total) by mouth 3 (three) times daily. 90 tablet 3   cetirizine (ZYRTEC) 10 MG tablet Take 1 tablet (10 mg total) by mouth daily. 90 tablet 1   Cholecalciferol (VITAMIN D PO) Take 2,000 Units by mouth daily.     cyclobenzaprine (FLEXERIL) 10 MG tablet Take 1 tablet  (10 mg total) by mouth 2 (two) times daily as needed for muscle spasms. 30 tablet 0   metoprolol succinate (TOPROL-XL) 50 MG 24 hr tablet Take 50 mg by mouth daily.     Omega-3 Fatty Acids (FISH OIL TRIPLE STRENGTH) 1360 MG CAPS Take 1 capsule by mouth daily.     Paliperidone Palmitate ER (INVEGA HAFYERA) 1092 MG/3.5ML SUSY Inject 1,092 mg into the muscle every 6 (six) months. 1092 mL 11   valbenazine (INGREZZA) 40 MG capsule Take 1 capsule (40 mg total) by mouth daily. 30 capsule 3   WEGOVY 0.25 MG/0.5ML SOAJ Inject 0.25 mg into the skin once a week.     WEGOVY 1 MG/0.5ML SOAJ Inject 1 mg into the skin once a week.     No current facility-administered medications for this visit.     Musculoskeletal: Strength & Muscle Tone: within normal limits, telehealth visit Gait & Station: normal, telehealth visit Patient leans: N/A  Psychiatric Specialty Exam: Review of Systems  There were no vitals taken for this visit.There is no height or weight on file to calculate BMI.  General Appearance: Well Groomed  Eye Contact:  Good  Speech:  Clear and Coherent and Normal Rate  Volume:  Normal  Mood:  Euthymic,   Affect:  Appropriate and Congruent  Thought Process:  Coherent, Goal Directed, and Linear  Orientation:  Full (Time, Place, and Person)  Thought Content: Logical and Hallucinations: Visual   Suicidal Thoughts:  No  Homicidal Thoughts:  No  Memory:  Immediate;   Good Recent;   Good Remote;   Good  Judgement:  Good  Insight:  Good  Psychomotor Activity:  Normal  Concentration:  Concentration: Good and Attention Span: Good  Recall:  Good  Fund of Knowledge: Good  Language: Good  Akathisia:  No  Handed:  Right  AIMS (if indicated): Not done  Assets:  Communication Skills Desire for Improvement Financial Resources/Insurance Housing Physical Health Social Support  ADL's:  Intact  Cognition: WNL  Sleep:  Fair   Screenings: AIMS    Flowsheet Row [facility] Visit from [facility]27/2022 in  [facility] Office Visit from 05/28/2022 in [facility] Office Visit from 02/26/2022 in [facility] Admission (Discharged) [facility]  AIMS Total Score 4 0 1 0      AUDIT    Flowsheet Row Admission (Discharged) from [facility]  Alcohol Use Disorder Identification Test Final Score (AUDIT) 1      GAD-7    Flowsheet Row Video Visit from 09/15/2022 in [facility] Office Visit from 08/27/2022 in [facility] Office Visit from 05/28/2022 in [facility] Office Visit from 02/26/2022 in [facility] Video Visit from 02/05/2022 in [facility]  Total GAD-7 Score 7 5 0 5 10      PHQ2-9    Flowsheet Row Video Visit from 09/15/2022 in [facility] Office Visit from 08/27/2022 in [facility] Office Visit from 05/28/2022 in [facility] Office Visit from 02/26/2022 in [facility] Video Visit from 02/05/2022 in [facility]  PHQ-2 Total Score 2 3 0 2 4  PHQ-9 Total Score 8 12 -- 10 16      Flowsheet Row Video Visit from 09/15/2022 in [facility] ED from 09/09/2022 in [facility] ED from 09/07/2022 in [facility] Emergency Department at [facility]  C-SSRS RISK CATEGORY No Risk No Risk No Risk        Assessment and Plan: Patient reports that his anxiety and depression since last visit.Today BuSpar 5 mg increased to 10 mg 3 times daily to help manage anxiety and depression.  Patient asked writer if he should be on a mood stabilizer.  Patient did not endorses  symptoms of mania.  At this time provider recommended that a mood stabilizer does not be started.  He endorsed understanding and agreed.  Patient notes that he is not started Ingrezza. Provider informed patient that if Allena Earing is effective Cogentin will be tapered and discontinued.  He endorsed understanding and agreed  1. Schizoaffective disorder, bipolar type (HCC)  Continue- benztropine (COGENTIN) 2 MG tablet; Take 1 tablet (2 mg total) by mouth 2 (two) times daily.  Dispense: 180 tablet; Refill: 2 Continue- Paliperidone Palmitate ER (INVEGA HAFYERA) 1092 MG/3.5ML SUSY; Inject 1,092 mg into the muscle every 6 (six) months.  Dispense: 1092 mL; Refill: 11  2. Tardive dyskinesia  Restart- valbenazine (INGREZZA) 40 MG capsule; Take 1 capsule (40 mg total) by mouth daily.  Dispense: 30 capsule; Refill: 3  3. Generalized anxiety disorder  Increased- busPIRone (BUSPAR) 10 MG tablet; Take 1 tablet (10 mg total) by mouth 3 (three) times daily.  Dispense: 90 tablet; Refill: 3      Follow-up in 2 months Follow-up therapy Shanna Cisco, NP 09/15/2022, 3:33 PM

## 2022-09-15 NOTE — Patient Instructions (Addendum)
Moderate persistent asthma, uncomplicated - Lung testing looked stable today. - Great work with stopping smoking.  - Daily controller medication(s): Breztri two puffs in the morning - Prior to physical activity: albuterol 2 puffs 10-15 minutes before physical activity. - Rescue medications: albuterol 4 puffs every 4-6 hours as needed - Changes during respiratory infections or worsening symptoms: Increase Breztri to 2 puffs twice daily for TWO WEEKS. - Asthma control goals:  * Full participation in all desired activities (may need albuterol before activity) * Albuterol use two time or less a week on average (not counting use with activity) * Cough interfering with sleep two time or less a month * Oral steroids no more than once a year * No hospitalizations  2. Allergic rhinitis - Resume the Flonase one spray per nostril up to twice daily.  - Continue with Zyrtec once daily.   3. Return to clinic scheduled in October.   Please inform us of any Emergency Department visits, hospitalizations, or changes in symptoms. Call us before going to the ED for breathing or allergy symptoms since we might be able to fit you in for a sick visit. Feel free to contact us anytime with any questions, problems, or concerns.  It was a pleasure to see you again today!  Websites that have reliable patient information: 1. American Academy of Asthma, Allergy, and Immunology: www.aaaai.org 2. Food Allergy Research and Education (FARE): foodallergy.org 3. Mothers of Asthmatics: http://www.asthmacommunitynetwork.org 4. American College of Allergy, Asthma, and Immunology: www.acaai.org   COVID-19 Vaccine Information can be found at: [email or web address].[email or web address] For questions related to vaccine distribution or appointments, please email [email or web address] or call [phone].   We realize that you might be concerned about having an allergic reaction to the  COVID19 vaccines. To help with that concern, WE ARE OFFERING THE COVID19 VACCINES IN OUR OFFICE! Ask the front desk for dates!     "Like" [place] on Facebook and Instagram for our latest updates!      A healthy democracy works best when [employer] participate! Make sure you are registered to vote! If you have moved or changed any of your contact information, you will need to get this updated before voting!  In some cases, you MAY be able to register to vote online: [email or web address]

## 2022-09-15 NOTE — Progress Notes (Signed)
FOLLOW UP  Date of Service/Encounter:  09/15/22   Assessment:   Moderate persistent asthma, uncomplicated - well controlled   Allergic rhinoconjunctivitis   Complicated past medical history including schizoaffective disorder  Plan/Recommendations:    Moderate persistent asthma, uncomplicated - Lung testing looked stable today. - Great work with stopping smoking.  - Daily controller medication(s): Breztri two puffs in the morning - Prior to physical activity: albuterol 2 puffs 10-15 minutes before physical activity. - Rescue medications: albuterol 4 puffs every 4-6 hours as needed - Changes during respiratory infections or worsening symptoms: Increase Breztri to 2 puffs twice daily for TWO WEEKS. - Asthma control goals:  * Full participation in all desired activities (may need albuterol before activity) * Albuterol use two time or less a week on average (not counting use with activity) * Cough interfering with sleep two time or less a month * Oral steroids no more than once a year * No hospitalizations  2. Allergic rhinitis - Resume the Flonase one spray per nostril up to twice daily.  - Continue with Zyrtec once daily.   3. Return to clinic scheduled in October.  Subjective:   Nathaniel [name] is a 22 y.o. male presenting today for follow up of  Chief Complaint  Patient presents with   Breathing Problem   Follow-up    ER follow up    Nathaniel [name] has a history of the following: Patient Active Problem List   Diagnosis Date Noted   Attention deficit hyperactivity disorder (ADHD), predominantly inattentive type 02/05/2022   Schizoaffective disorder, bipolar type (HCC) 11/03/2021   Generalized anxiety disorder 03/14/2020   Psychoactive substance-induced psychosis (HCC) 03/13/2020   Obstructive sleep apnea of adult 03/22/2018   Hypertrophy, nasal, turbinate 03/22/2018   Sore throat 07/13/2017   Hypogonadism male 04/02/2015   Acquired acanthosis nigricans 11/27/2014    Pre-diabetes 07/29/2013   Morbid obesity (HCC) 07/29/2013   Goiter 07/29/2013   Dyspepsia 07/29/2013   Essential hypertension, benign 07/29/2013   Gynecomastia, male 07/29/2013   PHARYNGITIS 04/16/2008   Acute upper respiratory infection 04/16/2008   INFLUENZA WITH OTHER RESPIRATORY MANIFESTATIONS 02/06/2008   HERPETIC WHITLOW 12/12/2007   COLD SORE 12/12/2007   MIXED RECEPTIVE-EXPRESSIVE LANGUAGE DISORDER 12/12/2007   CONSTIPATION 12/12/2007   URTICARIA 12/12/2007   UNSPECIFIED FETAL AND NEONATAL JAUNDICE 12/12/2007   CARDIAC FLOW MURMUR 12/12/2007   Perennial allergic rhinitis 12/09/2007   ASTHMA, MILD 12/09/2007    History obtained from: chart review and patient.  Nathaniel [name] is a 22 y.o. male presenting for a follow up visit.  We last saw him in April 2024.  At that time, we continued with Breztri 2 puffs twice daily as well as albuterol as needed.  For his allergic rhinitis, we stopped the Flonase and started Ryaltris 2 sprays per nostril daily as well as Zyrtec.  In the interim, he had an episode with increased SOB and pressure. He was placed on a nebulizer in the ED. He was started on prednisone and this seemed to help. He has been having some problems sleeping at night. He did complete the prednisone. He did not have fevers during that time at all. He typically is fairly stable. He thinks that this might have been preceded by smoking a lot of cigarettes during his birthday. He has not smoked for a week now. He thinks that this is what triggered his asthma worse. He did not have sinus or cold symptoms at all.   Asthma/Respiratory Symptom History: He remains on the [facility]  two puffs once daily, typically in the morning.  It has been over a year since he needed steroids before this most recent attack.  This is largely been working well to control his symptoms.  This is actually the first emergency room visit for his breathing in quite some time.  He does not remember the last time that he  needed prednisone for anything at all.  Allergic Rhinitis Symptom History: Allergic rhinitis has been under good control with the antihistamine.  He never started the Ryaltris because the prescription was too expensive.  He has not been on antibiotics for any sinus infections.  Otherwise, there have been no changes to his past medical history, surgical history, family history, or social history.    Review of Systems  Constitutional: Negative.  Negative for chills, fever, malaise/fatigue and weight loss.  HENT: Negative.  Negative for congestion, ear discharge and ear pain.   Eyes:  Negative for pain, discharge and redness.  Respiratory:  Negative for cough, sputum production, shortness of breath and wheezing.   Cardiovascular: Negative.  Negative for chest pain and palpitations.  Gastrointestinal:  Negative for abdominal pain, constipation, diarrhea, heartburn, nausea and vomiting.  Skin: Negative.  Negative for itching and rash.  Neurological:  Negative for dizziness and headaches.  Endo/Heme/Allergies:  Negative for environmental allergies. Does not bruise/bleed easily.       Objective:   Blood pressure 110/70, pulse 98, temperature 98.4 F (36.9 C), resp. rate (!) 24, height 6\' 1"  (1.854 m), weight (!) 333 lb 6.4 oz (151.2 kg), SpO2 98 %. Body mass index is 43.99 kg/m.    Physical Exam Vitals reviewed.  Constitutional:      Appearance: He is well-developed.  HENT:     Head: Normocephalic and atraumatic.     Right Ear: Tympanic membrane, ear canal and external ear normal.     Left Ear: Tympanic membrane, ear canal and external ear normal.     Nose: No nasal deformity, septal deviation, mucosal edema or rhinorrhea.     Right Turbinates: Enlarged, swollen and pale.     Left Turbinates: Enlarged, swollen and pale.     Right Sinus: No maxillary sinus tenderness or frontal sinus tenderness.     Left Sinus: No maxillary sinus tenderness or frontal sinus tenderness.      Comments: No nasal polyps.    Mouth/Throat:     Lips: Pink.     Mouth: Mucous membranes are moist. Mucous membranes are not pale and not dry.     Pharynx: Uvula midline.     Comments: Cobblestoning in the posterior oropharynx. Eyes:     General: Lids are normal. No allergic shiner.       Right eye: No discharge.        Left eye: No discharge.     Conjunctiva/sclera: Conjunctivae normal.     Right eye: Right conjunctiva is not injected. No chemosis.    Left eye: Left conjunctiva is not injected. No chemosis.    Pupils: Pupils are equal, round, and reactive to light.  Cardiovascular:     Rate and Rhythm: Normal rate and regular rhythm.     Heart sounds: Normal heart sounds.  Pulmonary:     Effort: Pulmonary effort is normal. No tachypnea, accessory muscle usage or respiratory distress.     Breath sounds: Normal breath sounds. No wheezing, rhonchi or rales.  Chest:     Chest wall: No tenderness.  Lymphadenopathy:     Cervical: No cervical adenopathy.  Skin:    Coloration: Skin is not pale.     Findings: No abrasion, erythema, petechiae or rash. Rash is not papular, urticarial or vesicular.  Neurological:     Mental Status: He is alert.  Psychiatric:        Behavior: Behavior is cooperative.      Diagnostic studies:    Spirometry: results normal (FEV1: 3.87/81%, FVC: 4.65/82%, FEV1/FVC: 83%).    Spirometry consistent with normal pattern.   Allergy Studies: none        Malachi Bonds, MD  [facility]

## 2022-09-15 NOTE — Telephone Encounter (Signed)
Fax received for prior authorization of Ingrezza 40mg . Submitted online with cover my meds. Awaiting decision.

## 2022-09-15 NOTE — Addendum Note (Signed)
Addended by: Philipp Deputy on: 09/15/2022 11:12 AM   Modules accepted: Orders

## 2022-09-15 NOTE — Telephone Encounter (Signed)
Ingrezza 40mg  has been approved until 09/15/23 for case [id number].

## 2022-09-15 NOTE — Telephone Encounter (Signed)
Lafonda Mosses from [facility] called and states that they are out of network for the patients insurance and can not fill Ingrezza. States it will be covered with [facility] at [phone] and will need to be sent to them.

## 2022-09-16 ENCOUNTER — Other Ambulatory Visit (HOSPITAL_COMMUNITY): Payer: Self-pay | Admitting: Psychiatry

## 2022-09-16 DIAGNOSIS — G2401 Drug induced subacute dyskinesia: Secondary | ICD-10-CM

## 2022-09-16 NOTE — Telephone Encounter (Signed)
Medication called in to [facility]. Patient given 11 refills.

## 2022-09-29 ENCOUNTER — Other Ambulatory Visit: Payer: Self-pay

## 2022-09-29 ENCOUNTER — Encounter (HOSPITAL_BASED_OUTPATIENT_CLINIC_OR_DEPARTMENT_OTHER): Payer: Self-pay

## 2022-09-29 DIAGNOSIS — G4700 Insomnia, unspecified: Secondary | ICD-10-CM | POA: Insufficient documentation

## 2022-09-29 DIAGNOSIS — Z79899 Other long term (current) drug therapy: Secondary | ICD-10-CM | POA: Insufficient documentation

## 2022-09-29 DIAGNOSIS — F419 Anxiety disorder, unspecified: Secondary | ICD-10-CM | POA: Insufficient documentation

## 2022-09-29 DIAGNOSIS — R079 Chest pain, unspecified: Secondary | ICD-10-CM | POA: Diagnosis not present

## 2022-09-29 DIAGNOSIS — I10 Essential (primary) hypertension: Secondary | ICD-10-CM | POA: Diagnosis not present

## 2022-09-29 DIAGNOSIS — J45909 Unspecified asthma, uncomplicated: Secondary | ICD-10-CM | POA: Diagnosis not present

## 2022-09-29 NOTE — ED Triage Notes (Addendum)
Patient here POV from Home.  Endorses not being able to sleep and having Anxiety for 2 Days. States he usually takes Buspirone which is effective but has not taken it for 2 Days due to its side effects. Took a dose tonight without relief.  No Pain. No SOB. No SI. No HI.   NAD noted during triage. A&Ox4. GCS 15. Ambulatory.

## 2022-09-30 ENCOUNTER — Emergency Department (HOSPITAL_COMMUNITY)
Admission: EM | Admit: 2022-09-30 | Discharge: 2022-09-30 | Disposition: A | Discharge status: Home / Self Care | Payer: No Typology Code available for payment source | Attending: Emergency Medicine | Admitting: Emergency Medicine

## 2022-09-30 ENCOUNTER — Emergency Department (HOSPITAL_BASED_OUTPATIENT_CLINIC_OR_DEPARTMENT_OTHER)
Admission: EM | Admit: 2022-09-30 | Discharge: 2022-09-30 | Disposition: A | Discharge status: Home / Self Care | Payer: No Typology Code available for payment source | Attending: Emergency Medicine | Admitting: Emergency Medicine

## 2022-09-30 ENCOUNTER — Encounter (HOSPITAL_COMMUNITY): Payer: Self-pay | Admitting: Emergency Medicine

## 2022-09-30 DIAGNOSIS — I10 Essential (primary) hypertension: Secondary | ICD-10-CM | POA: Insufficient documentation

## 2022-09-30 DIAGNOSIS — J45909 Unspecified asthma, uncomplicated: Secondary | ICD-10-CM | POA: Insufficient documentation

## 2022-09-30 DIAGNOSIS — F419 Anxiety disorder, unspecified: Secondary | ICD-10-CM | POA: Insufficient documentation

## 2022-09-30 DIAGNOSIS — G4700 Insomnia, unspecified: Secondary | ICD-10-CM

## 2022-09-30 DIAGNOSIS — R079 Chest pain, unspecified: Secondary | ICD-10-CM | POA: Insufficient documentation

## 2022-09-30 LAB — COMPREHENSIVE METABOLIC PANEL
ALT: 57 U/L — ABNORMAL HIGH (ref 0–44)
AST: 29 U/L (ref 15–41)
Albumin: 4.6 g/dL (ref 3.5–5.0)
Alkaline Phosphatase: 57 U/L (ref 38–126)
Anion gap: 12 (ref 5–15)
BUN: 5 mg/dL — ABNORMAL LOW (ref 6–20)
CO2: 21 mmol/L — ABNORMAL LOW (ref 22–32)
Calcium: 9.6 mg/dL (ref 8.9–10.3)
Chloride: 105 mmol/L (ref 98–111)
Creatinine, Ser: 0.84 mg/dL (ref 0.61–1.24)
GFR, Estimated: >60 mL/min (ref >60)
Glucose, Bld: 104 mg/dL — ABNORMAL HIGH (ref 70–99)
Potassium: 4.1 mmol/L (ref 3.5–5.1)
Sodium: 138 mmol/L (ref 135–145)
Total Bilirubin: 0.7 mg/dL (ref 0.3–1.2)
Total Protein: 7.9 g/dL (ref 6.5–8.1)

## 2022-09-30 LAB — CBC
HCT: 43.7 % (ref 39.0–52.0)
Hemoglobin: 15.1 g/dL (ref 13.0–17.0)
MCH: 29.8 pg (ref 26.0–34.0)
MCHC: 34.6 g/dL (ref 30.0–36.0)
MCV: 86.2 fL (ref 80.0–100.0)
Platelets: 337 10*3/uL (ref 150–400)
RBC: 5.07 MIL/uL (ref 4.22–5.81)
RDW: 12.3 % (ref 11.5–15.5)
WBC: 13 10*3/uL — ABNORMAL HIGH (ref 4.0–10.5)
nRBC: 0 % (ref 0.0–0.2)

## 2022-09-30 MED ORDER — DIAZEPAM 5 MG PO TABS
5.0000 mg | ORAL_TABLET | Freq: Once | ORAL | Status: AC
  Administered 2022-09-30: 5 mg via ORAL
  Filled 2022-09-30: qty 1

## 2022-09-30 MED ORDER — TRAZODONE HCL 50 MG PO TABS
50.0000 mg | ORAL_TABLET | Freq: Once | ORAL | Status: AC
  Administered 2022-09-30: 50 mg via ORAL
  Filled 2022-09-30: qty 1

## 2022-09-30 MED ORDER — TRAZODONE HCL 50 MG PO TABS: 50.0000 mg | ORAL_TABLET | Freq: Every day | ORAL | 0 refills | Status: DC

## 2022-09-30 NOTE — Discharge Instructions (Signed)
Touch base with your psychiatrist tomorrow.  Return to the emergency room if you have any worsening symptoms.

## 2022-09-30 NOTE — ED Provider Notes (Signed)
[place] EMERGENCY DEPARTMENT AT [facility]  Provider Note  CSN: [id number] Arrival date & time: 09/29/22 2249  History Chief Complaint  Patient presents with   Anxiety    Nathaniel [name] is a 22 y.o. male with history of schizoaffective d/o and anxiety brought to the ED by mother for evaluation of anxiety and insomnia. Has been ongoing for several weeks. Was prescribed buspar by [facility] and increased by Psych in the last 2 weeks. Patient has had diarrhea and thought it might be due to the buspar so he quit taking it although he did not think it was helping much anyway. He has not been having AVH or SI.    Home Medications Prior to Admission medications   Medication Sig Start Date End Date Taking? Authorizing Provider  traZODone (DESYREL) 50 MG tablet Take 1 tablet (50 mg total) by mouth at bedtime for 7 days. 09/30/22 10/07/22 Yes Pollyann Savoy, MD  albuterol (VENTOLIN HFA) 108 (90 Base) MCG/ACT inhaler Inhale 2 puffs into the lungs every 4 (four) hours as needed for wheezing or shortness of breath. 08/13/22   Alfonse Spruce, MD  benztropine (COGENTIN) 2 MG tablet Take 1 tablet (2 mg total) by mouth 2 (two) times daily. 09/15/22   Shanna Cisco, NP  Budeson-Glycopyrrol-Formoterol (BREZTRI AEROSPHERE) 160-9-4.8 MCG/ACT AERO INHALE 2 PUFFS INTO THE LUNGS TWICE A DAY 09/15/22   Alfonse Spruce, MD  cetirizine (ZYRTEC) 10 MG tablet Take 1 tablet (10 mg total) by mouth daily. 08/13/22   Alfonse Spruce, MD  Cholecalciferol (VITAMIN D PO) Take 2,000 Units by mouth daily.    [provider]  cyclobenzaprine (FLEXERIL) 10 MG tablet Take 1 tablet (10 mg total) by mouth 2 (two) times daily as needed for muscle spasms. 10/21/21   Janell Quiet, PA-C  metoprolol succinate (TOPROL-XL) 50 MG 24 hr tablet Take 50 mg by mouth daily. 07/28/21   [provider]  Omega-3 Fatty Acids (FISH OIL TRIPLE STRENGTH) 1360 MG CAPS Take 1 capsule by mouth daily. 10/27/21    [provider]  Paliperidone Palmitate ER (INVEGA HAFYERA) 1092 MG/3.5ML SUSY Inject 1,092 mg into the muscle every 6 (six) months. 09/15/22   Shanna Cisco, NP  valbenazine [facility]) 40 MG capsule Take 1 capsule (40 mg total) by mouth daily. 09/15/22   Shanna Cisco, NP  WEGOVY 0.25 MG/0.5ML SOAJ Inject 0.25 mg into the skin once a week.    [provider]  WEGOVY 1 MG/0.5ML SOAJ Inject 1 mg into the skin once a week. 08/10/22   [provider]     Allergies    Amoxicillin   Review of Systems   Review of Systems Please see HPI for pertinent positives and negatives  Physical Exam BP (!) 143/90 (BP Location: Right Arm)   Pulse (!) 125   Temp 98.2 F (36.8 C) (Oral)   Resp 14   Ht 6\' 1"  (1.854 m)   Wt (!) 146.5 kg   SpO2 100%   BMI 42.61 kg/m   Physical Exam Vitals and nursing note reviewed.  HENT:     Head: Normocephalic.     Nose: Nose normal.  Eyes:     Extraocular Movements: Extraocular movements intact.  Pulmonary:     Effort: Pulmonary effort is normal.  Musculoskeletal:        General: Normal range of motion.     Cervical back: Neck supple.  Skin:    Findings: No rash (on exposed  skin).  Neurological:     Mental Status: He is alert and oriented to person, place, and time.  Psychiatric:        Mood and Affect: Mood normal.     Comments: Calm and cooperative.      ED Results / Procedures / Treatments   EKG EKG Interpretation  Date/Time:  Tuesday Sep 29 2022 23:07:57 EDT Ventricular Rate:  121 PR Interval:  152 QRS Duration: 82 QT Interval:  316 QTC Calculation: 448 R Axis:   133 Text Interpretation: Sinus tachycardia Right axis deviation Cannot rule out Anterior infarct , age undetermined Abnormal ECG When compared with ECG of 21-Oct-2021 15:49, Non-specific ST-t changes Confirmed by Susy Frizzle [phone]) on 09/29/2022 11:29:11 PM  Procedures Procedures  Medications Ordered in the ED Medications  traZODone  (DESYREL) tablet 50 mg (has no administration in time range)    Initial Impression and Plan  Patient here with continued insomnia and anxiety. Has been off Buspar for 3 days. Will give a trial of Trazodone, but recommend Psychiatry/[facility] follow up for long term management.   ED Course       MDM Rules/Calculators/A&P Medical Decision Making Problems Addressed: Anxiety: chronic illness or injury with exacerbation, progression, or side effects of treatment Insomnia, unspecified type: chronic illness or injury with exacerbation, progression, or side effects of treatment  Amount and/or Complexity of Data Reviewed ECG/medicine tests: ordered and independent interpretation performed. Decision-making details documented in ED Course.  Risk Prescription drug management.     Final Clinical Impression(s) / ED Diagnoses Final diagnoses:  Insomnia, unspecified type  Anxiety    Rx / DC Orders ED Discharge Orders          Ordered    traZODone (DESYREL) 50 MG tablet  Daily at bedtime        09/30/22 0123             Pollyann Savoy, MD 09/30/22 [phone]

## 2022-09-30 NOTE — ED Provider Notes (Signed)
[place] EMERGENCY DEPARTMENT AT [facility] Provider Note   CSN: [id number] Arrival date & time: 09/30/22  1536     History  Chief Complaint  Patient presents with   Anxiety    Rajiv Suppes is a 22 y.o. male.  Patient is a 22 year old male who presents with agitation.  He has a history of hypertension asthma and schizoaffective disorder.  He drinks alcohol on the weekends but not typically during the week.  He presents with anxiety and inability to sleep.  He recently took a course of prednisone for an asthma flareup.  That seem to have triggered some increased anxiety.  He was started on BuSpar by his PCP.  It was then increased by his psychiatrist.  He started having some side effects such as diarrhea and nausea so stopped it 3 days ago.  He has had some increased anxiety where he has not been asleep in the last 3 days and feels very restless.  He went to the ED last night and was given a prescription for trazodone but says that did not help him sleep.  He denies any physical complaints other than the anxiety.  He had a little chest pain earlier today but none now.  No fevers or other recent illnesses.  No difficulty with ambulation.  No numbness or weakness in his extremities.  No thoughts of self-harm.       Home Medications Prior to Admission medications   Medication Sig Start Date End Date Taking? Authorizing Provider  albuterol (VENTOLIN HFA) 108 (90 Base) MCG/ACT inhaler Inhale 2 puffs into the lungs every 4 (four) hours as needed for wheezing or shortness of breath. 08/13/22   Alfonse Spruce, MD  benztropine (COGENTIN) 2 MG tablet Take 1 tablet (2 mg total) by mouth 2 (two) times daily. 09/15/22   Shanna Cisco, NP  Budeson-Glycopyrrol-Formoterol (BREZTRI AEROSPHERE) 160-9-4.8 MCG/ACT AERO INHALE 2 PUFFS INTO THE LUNGS TWICE A DAY 09/15/22   Alfonse Spruce, MD  cetirizine (ZYRTEC) 10 MG tablet Take 1 tablet (10 mg total) by mouth daily. 08/13/22    Alfonse Spruce, MD  Cholecalciferol (VITAMIN D PO) Take 2,000 Units by mouth daily.    [provider]  cyclobenzaprine (FLEXERIL) 10 MG tablet Take 1 tablet (10 mg total) by mouth 2 (two) times daily as needed for muscle spasms. 10/21/21   Janell Quiet, PA-C  metoprolol succinate (TOPROL-XL) 50 MG 24 hr tablet Take 50 mg by mouth daily. 07/28/21   [provider]  Omega-3 Fatty Acids (FISH OIL TRIPLE STRENGTH) 1360 MG CAPS Take 1 capsule by mouth daily. 10/27/21   [provider]  Paliperidone Palmitate ER (INVEGA HAFYERA) 1092 MG/3.5ML SUSY Inject 1,092 mg into the muscle every 6 (six) months. 09/15/22   Shanna Cisco, NP  traZODone (DESYREL) 50 MG tablet Take 1 tablet (50 mg total) by mouth at bedtime for 7 days. 09/30/22 10/07/22  Pollyann Savoy, MD  valbenazine [facility]) 40 MG capsule Take 1 capsule (40 mg total) by mouth daily. 09/15/22   Shanna Cisco, NP  WEGOVY 0.25 MG/0.5ML SOAJ Inject 0.25 mg into the skin once a week.    [provider]  WEGOVY 1 MG/0.5ML SOAJ Inject 1 mg into the skin once a week. 08/10/22   [provider]      Allergies    Amoxicillin    Review of Systems   Review of Systems  Constitutional:  Negative for chills, diaphoresis, fatigue and  fever.  HENT:  Negative for congestion, rhinorrhea and sneezing.   Eyes: Negative.   Respiratory:  Negative for cough, chest tightness and shortness of breath.   Cardiovascular:  Positive for chest pain. Negative for leg swelling.  Gastrointestinal:  Negative for abdominal pain, blood in stool, diarrhea, nausea and vomiting.  Genitourinary:  Negative for difficulty urinating, flank pain, frequency and hematuria.  Musculoskeletal:  Negative for arthralgias and back pain.  Skin:  Negative for rash.  Neurological:  Negative for dizziness, speech difficulty, weakness, numbness and headaches.  Psychiatric/Behavioral:  Negative for sleep disturbance. The patient is  nervous/anxious.     Physical Exam Updated Vital Signs BP 135/88 (BP Location: Left Arm)   Pulse 90   Temp 98 F (36.7 C) (Oral)   Resp 20   SpO2 99%  Physical Exam Constitutional:      Appearance: He is well-developed.  HENT:     Head: Normocephalic and atraumatic.  Eyes:     Pupils: Pupils are equal, round, and reactive to light.  Cardiovascular:     Rate and Rhythm: Normal rate and regular rhythm.     Heart sounds: Normal heart sounds.  Pulmonary:     Effort: Pulmonary effort is normal. No respiratory distress.     Breath sounds: Normal breath sounds. No wheezing or rales.  Chest:     Chest wall: No tenderness.  Abdominal:     General: Bowel sounds are normal.     Palpations: Abdomen is soft.     Tenderness: There is no abdominal tenderness. There is no guarding or rebound.  Musculoskeletal:        General: Normal range of motion.     Cervical back: Normal range of motion and neck supple.  Lymphadenopathy:     Cervical: No cervical adenopathy.  Skin:    General: Skin is warm and dry.     Findings: No rash.  Neurological:     Mental Status: He is alert and oriented to person, place, and time.     ED Results / Procedures / Treatments   Labs (all labs ordered are listed, but only abnormal results are displayed) Labs Reviewed  COMPREHENSIVE METABOLIC PANEL - Abnormal; Notable for the following components:      Result Value   CO2 21 (*)    Glucose, Bld 104 (*)    BUN 5 (*)    ALT 57 (*)    All other components within normal limits  CBC - Abnormal; Notable for the following components:   WBC 13.0 (*)    All other components within normal limits    EKG EKG Interpretation  Date/Time:  Wednesday Sep 30 2022 15:42:43 EDT Ventricular Rate:  129 PR Interval:  144 QRS Duration: 76 QT Interval:  298 QTC Calculation: 436 R Axis:   143 Text Interpretation: Sinus tachycardia Possible Anterolateral infarct , age undetermined Abnormal ECG When compared with ECG of  29-Sep-2022 23:07, PREVIOUS ECG IS PRESENT Confirmed by Rolan Bucco [phone]) on 09/30/2022 8:54:42 PM  Radiology No results found.  Procedures Procedures    Medications Ordered in ED Medications  diazepam (VALIUM) tablet 5 mg (5 mg Oral Given 09/30/22 2105)    ED Course/ Medical Decision Making/ A&P                             Medical Decision Making Amount and/or Complexity of Data Reviewed Labs: ordered.  Risk Prescription drug management.   Patient  is a 22 year old who presents with agitation.  Seem to have started after he took some prednisone.  He had some side effects of the buspirone and stopped it but recently has restarted it.  However he has not been able to sleep.  He is having a lot of anxiety.  He was started on trazodone last night but it did not help.  His EKG shows sinus tachycardia.  No ischemic changes.  It looks similar to prior EKGs which were also tachycardic.  His labs are nonconcerning.  He is calm and cooperative here in the ED.  He just feels like he needs to get some sleep to help with his anxiety.  He denies any thoughts of self-harm.  Will give him 1 dose of Valium here in the ED.  Advised him and his mom who is at bedside to talk to their psychiatrist tomorrow.  Return precautions were given.  Final Clinical Impression(s) / ED Diagnoses Final diagnoses:  Anxiety    Rx / DC Orders ED Discharge Orders     None         Rolan Bucco, MD 09/30/22 2130

## 2022-09-30 NOTE — ED Triage Notes (Signed)
Pt from home via [facility]. PT C/O chest pain, anxiety, and insomnia x 3 days. Pt recently started on Buspar.

## 2022-10-06 ENCOUNTER — Telehealth (HOSPITAL_COMMUNITY): Payer: Self-pay | Admitting: *Deleted

## 2022-10-06 NOTE — Telephone Encounter (Signed)
Mother called and explained that her son has been to the hospital multiple times for asthma and medication issues. He was being given Buspar that helped with anxiety but then developed diarrhea and dehydration. They took him off the medication and he continues to be restless. Mother states he was given Ativan once and it worked but once it wears off his anxiety comes back and he is shaking. They want to take him off Ingrezza but she states they asked her to call his psychiatrist. He is currently inpatient at [facility] hospital. Mothers number [phone]. She would like someone to call her to discuss his medications.

## 2022-10-07 NOTE — Discharge Summary (Signed)
 Hospital Medicine Discharge Summary   Demographics: Nathaniel [name]  22 y.o. [birth year] MRN: [id number]    Extended Emergency Contact Information Primary Emergency Contact: Shandiz,Mitra Home Phone: [phone] Relation: Mother  Full Code  Admit Date: 10/01/2022                            Attending Physician: Jama Rome Louder, MD Discharge Date: 10/07/2022  Primary Care Provider: Erminio Mitzie Sensing, MD   [id number]  Consults during this admission: Consult Orders             IP CONSULT TO UROLOGY       Specialty:  Urology  Provider:  (Not yet assigned)      IP CONSULT TO HOSPITALIST       Provider:  (Not yet assigned)      IP CONSULT TO INTENSIVIST       Specialty:  Critical Care (Intensivists)  Provider:  (Not yet assigned)      IP CONSULT TO PSYCHIATRY       Specialty:  Psychiatry  Provider:  (Not yet assigned)              Active & Resolved Diagnosis: Principal Problem (Resolved):   Serotonin syndrome Active Problems:   Schizoaffective disorder, bipolar type (HCC)   Generalized anxiety disorder   Morbid obesity (HCC)   History of schizoaffective disorder Resolved Problems:   Impaired gait and mobility   Disposition: Patient discharged to home in stable condition    History of present illness taken from history and physical dictated 5/31: Nathaniel [name] is a 22 y.o. male with PMHx Schizoaffective disorder, anxiety, depression, asthma, obesity, who presents to ED with Anxiety, Insomnia, Chest Pain over ~4 days. Pt with multiple ED visits over past 3 days (here & outside).    In ED, pt drowsy, but Ox4. Participative in exam & discussion. Mom at bedside. Sitter at bedside. Pt denies further CP, no SOB. Prior to admit, Tolerating PO. Pt confirms below HPI story. Denies SI/HI/AVH.   Pt hasn't slept in 4-5 days. Recently treated with steroids about 3 weeks ago. He has been having increased restlessness. His psychiatrist tried him on BuSpar , but he did not  feel this helped. Initially, he was given trazodone , which did not help. He was then given Valium , which did not help.     Hospital Course: Psychiatric disorders with concern for early serotonin syndrome greatly appreciate assistance from psychiatry team.  We do not plan to resume Ingrezza  at this time.  We will have patient follow-up with his psychiatrist to discuss appropriate timing to restart Ingrezza .  Patient's mother inquires about Invega  dose.  Patient's last dose was April 25.  She indicates the patient is on a every 6 month injection status and therefore would not be due until October.  We do not intend to have patient continue Ativan  following discharge from the hospital.  Atarax  has been prescribed on an as-needed basis for anxiety.   Hypertension antihypertensive medication regimen adjusted on 6/4 and this morning, blood pressure is 121/61.  Plasma metanephrine level to evaluate for pheochromocytoma has been requested but has not returned by time of discharge.  Symptoms are not consistent with pheochromocytoma but we will follow-up test results   Sinus tachycardia patient has persistent sinus tachycardia.  Metoprolol  dose increased with improved rate control noted.  Heart rate ranging from 101-121 at rest and with exertion on day of discharge.  Patient was able to  walk 6-8 laps around nurses station and maintaining improved rate control.  Will refer patient to cardiology for further adjustment and evaluation   Urinary incontinence patient reports this is a new symptom which has been ongoing for less than 1 week.  Patient does not have any suprapubic tenderness.  Urinalysis is not consistent with urinary tract infection.  Bladder scan does not demonstrate any evidence of urinary retention.  We will arrange for outpatient urology evaluation   Restlessness, tremulousness and decreased functional status unclear etiology.  Does not appear to be secondary to psychiatric medications.  We will  refer patient for neurology evaluation   Morbid obesity BMI is greater than 42.  Patient is taking Wegovy for weight loss.  This is nonformulary.  Patient's mother can bring medication from home so that he may receive his weekly dose  Insomnia patient's presenting symptom was actually insomnia.  Patient has been able to sleep with adjustments to medication regimen.          Wound / Incision Assessment: Refer to Chart Review and Media Tab for images if available.      Temp:  [97.6 F (36.4 C)-98.4 F (36.9 C)] 97.6 F (36.4 C) Heart Rate:  [101-124] 121 Resp:  [17-30] 30 BP: (121-153)/(61-89) 121/61  General appearance - alert, well appearing, and in no distress Mental status - alert, oriented to person, place, and time Eyes - pupils equal and reactive, extraocular eye movements intact Mouth - mucous membranes moist, pharynx normal without lesions Chest - clear to auscultation, no wheezes, rales or rhonchi, symmetric air entry Heart - normal rate, regular rhythm, normal S1, S2, no murmurs, rubs, clicks or gallops Abdomen - soft, nontender, nondistended, no masses or organomegaly       Discharge Medications     New Medications      Sig Disp Refill Start End  doxazosin  1 mg tablet Commonly known as: CARDURA   Take 1 tablet (1 mg total) by mouth daily.  90 tablet  0  October 08, 2022    hydrOXYzine  25 mg tablet Commonly known as: ATARAX   Take 1 tablet (25 mg total) by mouth every 8 (eight) hours as needed for anxiety.  60 tablet  0     lisinopriL  20 mg tablet Commonly known as: PRINIVIL   Take 1 tablet (20 mg total) by mouth daily.  90 tablet  3  October 08, 2022        Modified Medications      Sig Disp Refill Start End  metoprolol  succinate 50 mg 24 hr tablet Commonly known as: TOPROL  XL What changed:  how much to take when to take this  Take 2 tablets (100 mg total) by mouth 2 (two) times a day.  30 tablet  0         Medications To Continue       Sig Disp Refill Start End  Breztri  Aerosphere 160-9-4.8 mcg/actuation [facility] Generic drug: budesonide -glycopyr-formoterol   Inhale 2 puffs Once Daily.   0     cetirizine  10 mg tablet Commonly known as: ZyrTEC   Take 10 mg by mouth daily.   0     Invega  Sustenna 156 mg/mL Syrg injection Generic drug: paliperidone  palmitate  Inject 156 mg into the muscle every 30 (thirty) days.  1 mL  0     nicotine polacrilex 2 mg gum Commonly known as: NICORETTE  2 mg every hour as needed for smoking cessation.  100 tablet  0     omega 3-dha-epa-fish oil  850-1,400 mg Cap  Take 1 capsule by mouth Once Daily.   0     Wegovy 1 mg/0.5 mL injection pen Generic drug: semaglutide (weight loss)  Inject 1 mg under the skin every 7 days. On Tuesdays   0         Stopped Medications    Ingrezza  40 mg capsule Generic drug: valbenazine        Discharge Orders     Ambulatory referral to PCP     Ambulatory referral to PCP     Ambulatory referral to Physical Therapy     Details:    Service Requested: Non-Surgical Evaluation   Full Code     Lifting Limits:     Details:    Lifting Limits: No lifting limits   Lifting Limits:     Details:    Lifting Limits: No lifting limits   Regular diet     Details:    Diet type: Regular   Ambulatory referral to Urology     Details:    Reason for Referral: Urology   Reason for Referral: Incontinence   Ambulatory referral to Urology     Details:    Reason for Referral: Urology   Reason for Referral: Incontinence   Ambulatory referral to Cardiology     Details:    Are you referring from the ED?: Yes   Reason for referral: Cardiology - Adult   Ambulatory referral to Cardiology     Details:    Are you referring from the ED?: No   Reason for referral: Cardiology - Adult  Additional instructions:   This patient is currently using a home medication that needs to be returned to them prior to discharge.        Lab Results  Component Value  Date/Time   HGB 14.1 10/07/2022 09:21 AM   HCT 41.6 10/07/2022 09:21 AM   WBC 11.33 (H) 10/07/2022 09:21 AM   PLT 283 10/07/2022 09:21 AM   Lab Results  Component Value Date/Time   NA 140 10/06/2022 04:59 AM   K 3.8 10/06/2022 04:59 AM   CREATININE 0.75 10/06/2022 04:59 AM   BUN 13 10/06/2022 04:59 AM   GLUCOSE 100 (H) 10/06/2022 04:59 AM    Pertinent Imaging: XR Chest 1 View  Final Result by Delmar Herbert Winfred Booker Results In [place] [id number] (05/30 [id number])  CLINICAL DATA:  Chest pain.    EXAM:  CHEST  1 VIEW    COMPARISON:  October 21, 2021    FINDINGS:  The heart size and mediastinal contours are within normal limits.  Low lung volumes are noted. There is no evidence of an acute  infiltrate, pleural effusion or pneumothorax. The visualized  skeletal structures are unremarkable.    IMPRESSION:  No active disease.      Electronically Signed    By: Suzen Dials M.D.    On: 10/01/2022 03:26        Electronically signed by: Jama Rome Louder, MD 10/07/2022 11:40 AM   Time spent on discharge: 35 minutes

## 2022-10-08 ENCOUNTER — Encounter (HOSPITAL_COMMUNITY): Payer: Self-pay | Admitting: Family Medicine

## 2022-10-08 ENCOUNTER — Inpatient Hospital Stay (HOSPITAL_COMMUNITY)
Admission: AD | Admit: 2022-10-08 | Discharge: 2022-10-09 | Disposition: A | Discharge status: Home / Self Care | Payer: No Typology Code available for payment source | Source: Intra-hospital | Attending: Internal Medicine | Admitting: Internal Medicine

## 2022-10-08 ENCOUNTER — Emergency Department (HOSPITAL_BASED_OUTPATIENT_CLINIC_OR_DEPARTMENT_OTHER)
Admission: EM | Admit: 2022-10-08 | Discharge: 2022-10-08 | Disposition: A | Discharge status: Other Acute Inpatient Hospital | Payer: No Typology Code available for payment source | Source: Home / Self Care | Attending: Emergency Medicine | Admitting: Emergency Medicine

## 2022-10-08 ENCOUNTER — Encounter (HOSPITAL_BASED_OUTPATIENT_CLINIC_OR_DEPARTMENT_OTHER): Payer: Self-pay | Admitting: Emergency Medicine

## 2022-10-08 ENCOUNTER — Other Ambulatory Visit: Payer: Self-pay

## 2022-10-08 DIAGNOSIS — F251 Schizoaffective disorder, depressive type: Secondary | ICD-10-CM

## 2022-10-08 DIAGNOSIS — R45851 Suicidal ideations: Secondary | ICD-10-CM

## 2022-10-08 DIAGNOSIS — G4709 Other insomnia: Secondary | ICD-10-CM | POA: Insufficient documentation

## 2022-10-08 DIAGNOSIS — F5105 Insomnia due to other mental disorder: Secondary | ICD-10-CM | POA: Diagnosis not present

## 2022-10-08 DIAGNOSIS — R079 Chest pain, unspecified: Secondary | ICD-10-CM | POA: Diagnosis present

## 2022-10-08 DIAGNOSIS — I2699 Other pulmonary embolism without acute cor pulmonale: Principal | ICD-10-CM

## 2022-10-08 DIAGNOSIS — F17210 Nicotine dependence, cigarettes, uncomplicated: Secondary | ICD-10-CM | POA: Diagnosis not present

## 2022-10-08 DIAGNOSIS — I10 Essential (primary) hypertension: Secondary | ICD-10-CM | POA: Diagnosis not present

## 2022-10-08 DIAGNOSIS — J45909 Unspecified asthma, uncomplicated: Secondary | ICD-10-CM | POA: Diagnosis not present

## 2022-10-08 DIAGNOSIS — R748 Abnormal levels of other serum enzymes: Secondary | ICD-10-CM | POA: Insufficient documentation

## 2022-10-08 DIAGNOSIS — Z79899 Other long term (current) drug therapy: Secondary | ICD-10-CM | POA: Insufficient documentation

## 2022-10-08 DIAGNOSIS — R531 Weakness: Secondary | ICD-10-CM | POA: Diagnosis not present

## 2022-10-08 LAB — ACETAMINOPHEN LEVEL: Acetaminophen (Tylenol), Serum: <10 ug/mL — ABNORMAL LOW (ref 10–30)

## 2022-10-08 LAB — URINALYSIS, ROUTINE W REFLEX MICROSCOPIC
Bacteria, UA: NONE SEEN
Bilirubin Urine: NEGATIVE
Glucose, UA: NEGATIVE mg/dL
Leukocytes,Ua: NEGATIVE
Nitrite: NEGATIVE
Protein, ur: 30 mg/dL — AB
Specific Gravity, Urine: 1.029 (ref 1.005–1.030)
pH: 5.5 (ref 5.0–8.0)

## 2022-10-08 LAB — COMPREHENSIVE METABOLIC PANEL
ALT: 44 U/L (ref 0–44)
AST: 25 U/L (ref 15–41)
Albumin: 4.9 g/dL (ref 3.5–5.0)
Alkaline Phosphatase: 47 U/L (ref 38–126)
Anion gap: 13 (ref 5–15)
BUN: 21 mg/dL — ABNORMAL HIGH (ref 6–20)
CO2: 19 mmol/L — ABNORMAL LOW (ref 22–32)
Calcium: 10.1 mg/dL (ref 8.9–10.3)
Chloride: 107 mmol/L (ref 98–111)
Creatinine, Ser: 0.97 mg/dL (ref 0.61–1.24)
GFR, Estimated: >60 mL/min (ref >60)
Glucose, Bld: 106 mg/dL — ABNORMAL HIGH (ref 70–99)
Potassium: 4.1 mmol/L (ref 3.5–5.1)
Sodium: 139 mmol/L (ref 135–145)
Total Bilirubin: 0.5 mg/dL (ref 0.3–1.2)
Total Protein: 7.6 g/dL (ref 6.5–8.1)

## 2022-10-08 LAB — CBC WITH DIFFERENTIAL/PLATELET
Abs Immature Granulocytes: 0.06 10*3/uL (ref 0.00–0.07)
Basophils Absolute: 0.1 10*3/uL (ref 0.0–0.1)
Basophils Relative: 0 %
Eosinophils Absolute: 0.1 10*3/uL (ref 0.0–0.5)
Eosinophils Relative: 1 %
HCT: 41.1 % (ref 39.0–52.0)
Hemoglobin: 14.5 g/dL (ref 13.0–17.0)
Immature Granulocytes: 0 %
Lymphocytes Relative: 24 %
Lymphs Abs: 3.3 10*3/uL (ref 0.7–4.0)
MCH: 30.1 pg (ref 26.0–34.0)
MCHC: 35.3 g/dL (ref 30.0–36.0)
MCV: 85.4 fL (ref 80.0–100.0)
Monocytes Absolute: 1.1 10*3/uL — ABNORMAL HIGH (ref 0.1–1.0)
Monocytes Relative: 8 %
Neutro Abs: 9.3 10*3/uL — ABNORMAL HIGH (ref 1.7–7.7)
Neutrophils Relative %: 67 %
Platelets: 334 10*3/uL (ref 150–400)
RBC: 4.81 MIL/uL (ref 4.22–5.81)
RDW: 12.4 % (ref 11.5–15.5)
WBC: 13.9 10*3/uL — ABNORMAL HIGH (ref 4.0–10.5)
nRBC: 0 % (ref 0.0–0.2)

## 2022-10-08 LAB — RAPID URINE DRUG SCREEN, HOSP PERFORMED
Amphetamines: NOT DETECTED
Barbiturates: NOT DETECTED
Benzodiazepines: NOT DETECTED
Cocaine: NOT DETECTED
Opiates: POSITIVE — AB
Tetrahydrocannabinol: POSITIVE — AB

## 2022-10-08 LAB — SALICYLATE LEVEL: Salicylate Lvl: <7 mg/dL — ABNORMAL LOW (ref 7.0–30.0)

## 2022-10-08 LAB — ETHANOL: Alcohol, Ethyl (B): <10 mg/dL (ref <10)

## 2022-10-08 LAB — CK: Total CK: 488 U/L — ABNORMAL HIGH (ref 49–397)

## 2022-10-08 MED ORDER — METOPROLOL SUCCINATE ER 25 MG PO TB24
50.0000 mg | ORAL_TABLET | Freq: Every day | ORAL | Status: DC
  Filled 2022-10-08 (×2): qty 1

## 2022-10-08 MED ORDER — LISINOPRIL 10 MG PO TABS
20.0000 mg | ORAL_TABLET | Freq: Every day | ORAL | Status: DC
  Administered 2022-10-08: 20 mg via ORAL
  Filled 2022-10-08: qty 2

## 2022-10-08 MED ORDER — LORATADINE 10 MG PO TABS
10.0000 mg | ORAL_TABLET | Freq: Every day | ORAL | Status: DC
  Administered 2022-10-08: 10 mg via ORAL
  Filled 2022-10-08: qty 1

## 2022-10-08 MED ORDER — HYDROXYZINE HCL 25 MG PO TABS
25.0000 mg | ORAL_TABLET | Freq: Three times a day (TID) | ORAL | Status: DC | PRN
  Administered 2022-10-08: 25 mg via ORAL
  Filled 2022-10-08: qty 1

## 2022-10-08 MED ORDER — ALBUTEROL SULFATE HFA 108 (90 BASE) MCG/ACT IN AERS: 2.0000 | INHALATION_SPRAY | RESPIRATORY_TRACT | Status: DC | PRN

## 2022-10-08 MED ORDER — ALUM & MAG HYDROXIDE-SIMETH 200-200-20 MG/5ML PO SUSP: 30.0000 mL | ORAL | Status: DC | PRN

## 2022-10-08 MED ORDER — ALBUTEROL SULFATE HFA 108 (90 BASE) MCG/ACT IN AERS
2.0000 | INHALATION_SPRAY | RESPIRATORY_TRACT | Status: DC | PRN
  Administered 2022-10-08: 2 via RESPIRATORY_TRACT
  Filled 2022-10-08: qty 6.7

## 2022-10-08 MED ORDER — OLANZAPINE 5 MG PO TBDP
5.0000 mg | ORAL_TABLET | Freq: Three times a day (TID) | ORAL | Status: DC | PRN
  Administered 2022-10-08: 5 mg via ORAL
  Filled 2022-10-08: qty 1

## 2022-10-08 MED ORDER — ACETAMINOPHEN 325 MG PO TABS
650.0000 mg | ORAL_TABLET | Freq: Four times a day (QID) | ORAL | Status: DC | PRN
  Administered 2022-10-08: 650 mg via ORAL
  Filled 2022-10-08: qty 2

## 2022-10-08 MED ORDER — MAGNESIUM HYDROXIDE 400 MG/5ML PO SUSP: 30.0000 mL | Freq: Every day | ORAL | Status: DC | PRN

## 2022-10-08 MED ORDER — ZIPRASIDONE MESYLATE 20 MG IM SOLR: 20.0000 mg | INTRAMUSCULAR | Status: DC | PRN

## 2022-10-08 MED ORDER — VALBENAZINE TOSYLATE 40 MG PO CAPS: 40.0000 mg | ORAL_CAPSULE | Freq: Every day | ORAL | Status: DC

## 2022-10-08 MED ORDER — TRAZODONE HCL 50 MG PO TABS
100.0000 mg | ORAL_TABLET | Freq: Every evening | ORAL | Status: DC | PRN
  Administered 2022-10-08: 100 mg via ORAL
  Filled 2022-10-08: qty 1

## 2022-10-08 MED ORDER — LORAZEPAM 1 MG PO TABS
1.0000 mg | ORAL_TABLET | ORAL | Status: AC | PRN
  Administered 2022-10-08: 1 mg via ORAL
  Filled 2022-10-08: qty 1

## 2022-10-08 MED ORDER — LISINOPRIL 20 MG PO TABS
20.0000 mg | ORAL_TABLET | Freq: Every day | ORAL | Status: DC
  Filled 2022-10-08 (×2): qty 1

## 2022-10-08 MED ORDER — LORATADINE 10 MG PO TABS
10.0000 mg | ORAL_TABLET | Freq: Every day | ORAL | Status: DC
  Administered 2022-10-09: 10 mg via ORAL
  Filled 2022-10-08 (×3): qty 1

## 2022-10-08 MED ORDER — SODIUM CHLORIDE 0.9 % IV BOLUS: 1000.0000 mL | Freq: Once | INTRAVENOUS | Status: DC

## 2022-10-08 MED ORDER — DOXAZOSIN MESYLATE 1 MG PO TABS
1.0000 mg | ORAL_TABLET | Freq: Every day | ORAL | Status: DC
  Filled 2022-10-08: qty 1

## 2022-10-08 MED ORDER — METOPROLOL SUCCINATE ER 25 MG PO TB24
50.0000 mg | ORAL_TABLET | Freq: Every day | ORAL | Status: DC
  Administered 2022-10-08: 50 mg via ORAL
  Filled 2022-10-08: qty 2

## 2022-10-08 MED ORDER — DOXAZOSIN MESYLATE 1 MG PO TABS
1.0000 mg | ORAL_TABLET | Freq: Every day | ORAL | Status: DC
  Administered 2022-10-09: 1 mg via ORAL
  Filled 2022-10-08 (×4): qty 1

## 2022-10-08 NOTE — Progress Notes (Signed)
Pt constantly coming up to the nursing station stating various somatic complaints, pt originally stated he could not walk, but was coming up to the nursing station constantly. Pt stated he could not breathe , pt given his inhaler PRN per MAR.
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
## 2022-10-08 NOTE — ED Notes (Signed)
Pt. Changed into scrubs. 

## 2022-10-08 NOTE — ED Notes (Addendum)
Called [facility]; will be picking up pt at 1:00 pm

## 2022-10-08 NOTE — Progress Notes (Signed)
Pt stated he was having tightness in his chest "I'm having a heart attack" . Pt vitals were taken and EKG done and NP-Victoria notified , stated pt will be sent out for medical clearance after she discusses with ED doctor , will continue to monitor

## 2022-10-08 NOTE — Progress Notes (Signed)
Pt was accepted to [facility] TODAY 10/08/2022. Bed assignment: 403-2  Pt meets inpatient criteria per Joaquin Courts, NP  Attending Physician will be Phineas Inches, MD  Report can be called to: - Adult unit: [phone]  Pt can arrive after 1 PM  Care Team Notified: [facility] [place], RN, [place], [place], [place], RN, and [place], NP  [place], [place]  10/08/2022 12:24 PM

## 2022-10-08 NOTE — ED Notes (Signed)
Report given to [facility] RN. Pt being transferred to [facility] via [facility] voluntary. BH consent signed. Pts mother has all of pts belongings. Pt d/c from ED.

## 2022-10-08 NOTE — ED Notes (Signed)
Pt and mother (with pt's consent) speaking with BH via TTS

## 2022-10-08 NOTE — Progress Notes (Signed)
   10/08/22 1449  Psych Admission Type (Psych Patients Only)  Admission Status Voluntary  Psychosocial Assessment  Patient Complaints Anxiety;Decreased concentration;Crying spells;Depression;Insomnia;Restlessness  Eye Contact Intense;Staring  Facial Expression Blank;Flat  Affect Blunted;Flat  Speech Soft  Interaction Cautious;Guarded;Minimal  Motor Activity Slow;Shuffling;Unsteady  Appearance/Hygiene Body odor;Disheveled;In scrubs  Behavior Characteristics Cooperative  Mood Anxious  Aggressive Behavior  Effect No apparent injury  Thought Process  Coherency Circumstantial  Content Blaming self  Delusions None reported or observed  Perception WDL  Hallucination None reported or observed  Judgment Limited  Confusion None  Danger to Self  Current suicidal ideation? Denies  Danger to Others  Danger to Others None reported or observed

## 2022-10-08 NOTE — ED Provider Notes (Signed)
[facility] EMERGENCY [facility] Emergency Department Provider Note MRN:  [id number]  Arrival date & time: 10/08/22     Chief Complaint   Suicidal   History of Present Illness   Nathaniel [name] is a 22 y.o. year-old male with a history of schizoaffective disorder presenting to the ED with chief complaint of suicidal.  Several weeks of restlessness, insomnia, which is becoming very dire at home.  Family explaining that he has been on a number of new medications including Wegovy, prednisone burst for his asthma, buspirone was added on recently, trazodone was given for sleep recently.  Family concerned that the medication combinations are causing issues and are not helping his mental health.  He was recently hospitalized at [facility] and a psychiatric capacity and was discharged yesterday.  He expressed intent on suicide to his mother and mother actually found him with a knife trying to harm himself this evening.  Here for evaluation.  Review of Systems  A thorough review of systems was obtained and all systems are negative except as noted in the HPI and PMH.   Patient's Health History    Past Medical History:  Diagnosis Date   Allergic rhinitis    Chronic tonsillar hypertrophy    ENT did tonsillectomy 2020   Elevated blood pressure reading without diagnosis of hypertension    IFG (impaired fasting glucose) 12/2017   Gluc 107, HbA1c 5.5%   Obesity, Class II, BMI 35-39.9    OSA (obstructive sleep apnea) 02/2018   Sleep study recommended by Dr. Ivory Broad considering it.   Severe persistent asthma    Dr. Sharyn Lull    Past Surgical History:  Procedure Laterality Date   TONSILLECTOMY  06/03/2018    Family History  Problem Relation Age of Onset   Heart attack Other    Prostate cancer Other    Hypertension Father    Hypertension Maternal Grandfather    Cancer Maternal Grandfather    Asthma Neg Hx     Social History   Socioeconomic History   Marital status: Single     Spouse name: Not on file   Number of children: Not on file   Years of education: Not on file   Highest education level: Not on file  Occupational History   Not on file  Tobacco Use   Smoking status: Never    Passive exposure: Never   Smokeless tobacco: Never  Vaping Use   Vaping Use: Never used  Substance and Sexual Activity   Alcohol use: Yes    Comment: Rare   Drug use: No   Sexual activity: Yes  Other Topics Concern   Not on file  Social History Narrative   Single, lives with mom in [place].   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   No T/A/Ds.   Social Determinants of Health   Financial Resource Strain: Low Risk  (11/10/2021)   Overall Financial Resource Strain (CARDIA)    Difficulty of Paying Living Expenses: Not hard at all  Food Insecurity: No Food Insecurity (11/10/2021)   Hunger Vital Sign    Worried About Running Out of Food in the Last Year: Never true    Ran Out of Food in the Last Year: Never true  Transportation Needs: No Transportation Needs (11/10/2021)   PRAPARE - Administrator, Civil Service (Medical): No    Lack of Transportation (Non-Medical): No  Physical Activity: Sufficiently Active (11/10/2021)   Exercise Vital Sign  Days of Exercise per Week: 6 days    Minutes of Exercise per Session: 30 min  Stress: Stress Concern Present (11/10/2021)   [employer] of Occupational Health - Occupational Stress Questionnaire    Feeling of Stress : Very much  Social Connections: Socially Isolated (11/10/2021)   Social Connection and Isolation Panel [NHANES]    Frequency of Communication with Friends and Family: More than three times a week    Frequency of Social Gatherings with Friends and Family: More than three times a week    Attends Religious Services: Never    Database administrator or Organizations: No    Attends Banker Meetings: Never    Marital Status: Never married  Intimate Partner  Violence: Not At Risk (11/10/2021)   Humiliation, Afraid, Rape, and Kick questionnaire    Fear of Current or Ex-Partner: No    Emotionally Abused: No    Physically Abused: No    Sexually Abused: No     Physical Exam   Vitals:   10/08/22 0430 10/08/22 0445  BP: (!) 152/101 (!) 152/89  Pulse: (!) 112 (!) 111  Resp: 16 16  Temp:    SpO2: 99% 99%    CONSTITUTIONAL: Well-appearing, NAD NEURO/PSYCH:  Alert and oriented x 3, no focal deficits EYES:  eyes equal and reactive ENT/NECK:  no LAD, no JVD CARDIO: Tachycardic rate, well-perfused, normal S1 and S2 PULM:  CTAB no wheezing or rhonchi GI/GU:  non-distended, non-tender MSK/SPINE:  No gross deformities, no edema SKIN:  no rash, atraumatic   *Additional and/or pertinent findings included in MDM below  Diagnostic and Interventional Summary    EKG Interpretation  Date/Time:  Thursday October 08 2022 00:33:54 EDT Ventricular Rate:  111 PR Interval:  151 QRS Duration: 83 QT Interval:  346 QTC Calculation: 471 R Axis:   116 Text Interpretation: Sinus tachycardia Right axis deviation Low voltage, extremity leads Anterolateral Q wave, probably normal for age ST elevation, consider inferior injury Confirmed by Kennis Carina [id number]) on 10/08/2022 1:56:12 AM       Labs Reviewed  COMPREHENSIVE METABOLIC PANEL - Abnormal; Notable for the following components:      Result Value   CO2 19 (*)    Glucose, Bld 106 (*)    BUN 21 (*)    All other components within normal limits  RAPID URINE DRUG SCREEN, HOSP PERFORMED - Abnormal; Notable for the following components:   Opiates POSITIVE (*)    Tetrahydrocannabinol POSITIVE (*)    All other components within normal limits  CBC WITH DIFFERENTIAL/PLATELET - Abnormal; Notable for the following components:   WBC 13.9 (*)    Neutro Abs 9.3 (*)    Monocytes Absolute 1.1 (*)    All other components within normal limits  ACETAMINOPHEN LEVEL - Abnormal; Notable for the following components:    Acetaminophen (Tylenol), Serum <10 (*)    All other components within normal limits  SALICYLATE LEVEL - Abnormal; Notable for the following components:   Salicylate Lvl <7.0 (*)    All other components within normal limits  URINALYSIS, ROUTINE W REFLEX MICROSCOPIC - Abnormal; Notable for the following components:   Color, Urine ORANGE (*)    APPearance CLOUDY (*)    Hgb urine dipstick TRACE (*)    Ketones, ur TRACE (*)    Protein, ur 30 (*)    All other components within normal limits  CK - Abnormal; Notable for the following components:   Total CK 488 (*)  All other components within normal limits  ETHANOL    No orders to display    Medications - No data to display   Procedures  /  Critical Care Procedures  ED Course and Medical Decision Making  Initial Impression and Ddx Schizoaffective disorder with suicidal intent this evening, will need psychiatry evaluation.  Mild tachycardia, has not been eating or drinking that well, suspect some mild dehydration.  Will obtain screening labs including Tylenol and salicylate.  Past medical/surgical history that increases complexity of ED encounter: Schizoaffective disorder  Interpretation of Diagnostics I personally reviewed the EKG and my interpretation is as follows: Sinus rhythm  Labs overall reassuring with no significant blood count or electrolyte disturbance, mild acidosis.  Patient Reassessment and Ultimate Disposition/Management     Patient was complaining of some leg soreness for the past few days and so screening CK obtained and it is mildly elevated in the 500s.  Attempted to provide patient with a bag of IV fluids but he is refusing.  He is willing to drink fluids which seems appropriate.  This is not a reason for medical admission, patient is medically cleared awaiting TTS recommendations.  Signed out to default provider.  Patient management required discussion with the following services or consulting groups:   Psychiatry/TTS  Complexity of Problems Addressed Acute illness or injury that poses threat of life of bodily function  Additional Data Reviewed and Analyzed Further history obtained from: Further history from spouse/family member  Additional Factors Impacting ED Encounter Risk Consideration of hospitalization  Elmer Sow. Pilar Plate, MD [facility] Emergency Medicine [facility] [email or web address]  Final Clinical Impressions(s) / ED Diagnoses     ICD-10-CM   1. Elevated CK  R74.8     2. Suicidal ideation  R45.851       ED Discharge Orders     None        Discharge Instructions Discussed with and Provided to Patient:   Discharge Instructions   None      Sabas Sous, MD 10/08/22 [phone]

## 2022-10-08 NOTE — Progress Notes (Signed)
Pt stated he had problems walking and needed assistance going to the bathroom upon initial interaction. Pt was given a urinal and educated on the expectations of the patients on the unit, pt was informed that the criteria for patients coming here is they are able to do their own daily cares by themselves or with minimal assistance. Pt walked to the medication to get his night medications without issue, will continue to monitor.     10/08/22 2100  Psych Admission Type (Psych Patients Only)  Admission Status Voluntary  Psychosocial Assessment  Patient Complaints Anxiety  Eye Contact Avoids  Facial Expression Blank;Flat  Affect Blunted;Flat  Speech Soft  Interaction Cautious;Guarded;Minimal  Motor Activity Slow;Shuffling  Appearance/Hygiene Disheveled;Body odor  Behavior Characteristics Cooperative  Mood Anxious  Aggressive Behavior  Effect No apparent injury  Thought Process  Coherency Circumstantial  Content Blaming self  Delusions None reported or observed  Perception WDL  Hallucination None reported or observed  Judgment Limited  Confusion WDL  Danger to Self  Current suicidal ideation? Denies  Danger to Others  Danger to Others None reported or observed

## 2022-10-08 NOTE — Group Note (Signed)
Date:  10/08/2022 Time:  8:20 PM  Group Topic/Focus:  Wrap-Up Group:   The focus of this group is to help patients review their daily goal of treatment and discuss progress on daily workbooks.    Participation Level:  Did Not Attend   Scot Dock 10/08/2022, 8:20 PM

## 2022-10-08 NOTE — Progress Notes (Signed)
Patient arrived to [facility] at 1345. Hx of schizoaffective disorder. Patient reports that he was just recently discharged from hospital in [place] and that he has not been sleeping. Reports he has been with out sleep for 7 or 8 days. Reports that he does take medication at home and that he has been compliant with the medications. He reports that while at the hospital in [facility], they started him on a new medication that he feels is not working. Patient is also reporting generalized pain that he says comes from sleeping in the hospital bed that was uncomfortable. During the skin assessment, patient needed assistance both putting on the gown and putting on the scrub top. Reports that the pain is hindering him and has been affecting him for about 7 days. Patient denies AVH, however, during the admission interview, patient observed clinching his fist and shaking his head repeatedly while this Thereasa Parkin was going over the treatment agreement. Patient reports that his inability to sleep has been his biggest stressor and that is the reason he attempted to cut himself with the kitchen knife. Patient ambulates independently, slow and cautious. Patient at the time of admission interview denies SI/HI. Reports that he smokes about 4-5 cigarettes/day.

## 2022-10-08 NOTE — ED Notes (Signed)
Pt. Refused IV.

## 2022-10-08 NOTE — ED Notes (Signed)
0330  LSW completed intake assessment. Pt. Resting with eyes closed,respirations even and unlabored

## 2022-10-08 NOTE — ED Notes (Addendum)
Tried to contact TTS; no answer [UPDATE: contacted TTS again, informed that Cortney Mas, RN will be speaking with Joaquin Courts, for further evaluation and updates]

## 2022-10-08 NOTE — ED Triage Notes (Signed)
Discharge today from [facility] Restless, anxiety increase.  Patient is having feels of hopelessness. Tells mom he doesn't want to live any more and reached for a knife. Agreeable to come here to get help

## 2022-10-08 NOTE — Consult Note (Signed)
Telepsych Consultation   Reason for Consult:  Psychiatric Consult  Referring Physician: Rolene Arbour Location of Patient: [facility]  Location of Provider: Other: [facility]   Patient Identification: Nathaniel [name] MRN:  [id number] Principal Diagnosis: Schizoaffective disorder, depressive type (HCC) Diagnosis:  Principal Problem:   Schizoaffective disorder, depressive type (HCC) Active Problems:   Insomnia due to mental disorder   Total Time spent with patient: 30 minutes  Subjective:   Nathaniel [name] is a 22 y.o. male patient admitted with mental health history consisting of schizoaffective disorder with depression, anxiety, but obesity was admitted to [facility] emergency department around midnight with suicidal ideations and had attempted to grab a knife to harm himself in the presence of his mother.  Patient was discharged only yesterday from [facility] facility due to schizoaffective disorder exacerbation and persistent insomnia.  HPI:   Nathaniel [name], 22 y.o., male patient seen via tele health by this provider, consulted with Dr. Lucianne Muss; and chart reviewed on 10/08/22.     On evaluation Nathaniel [name], is accompanied by his mother Nathaniel [name] who he requests provide history related to his admission here at [facility].  Mother Nathaniel [name] [phone] reports that over the last 3 weeks patient's mental health has declined patient has not endorsed more frequent suicidal ideation and is not sleeping.  His psychiatric provider has trialed him on Valium, trazodone, and hydroxyzine without any significant improvement in his sleep quality.  Patient has been psychiatrically stable on Invega Sustenna , transitioned to [place], and transitioned to [place] with last injection received on 08/27/22 for a period of 11 months, with admission on 10/01/22 and his first psychiatric admission since initiation of Invega injection. Nathaniel [name] reports  during his psychiatric admission recently that he continue to experience suicidal ideations.  He also reports that he still did not achieve appropriate sleep during his admission.Cardarius endorses that he feels if he could sleep it would help with his overall mental health.  He denies hearing any voices but endorses hopelessness and depression.  Of note patient's grandfather passed away in 07/12/22 and mother reports that she has noticed the decline in his overall mood since that time as he was very close with his grandfather.  Patient receives weekly therapy sessions with a counselor and has a psychiatric for medication management.  Patient endorses that he is still unable to contract for safety and although he is not actively suicidal at this moment he fears that if he were to return home he is uncertain if he can keep himself safe.  Mother also supports that patient needs additional mental health stabilization as the suicidal thoughts and suicidal behavior is a new him since his diagnosis of schizophrenia.  Was seen and consulted by TTS earlier this morning who also recommended patient for inpatient psychiatric treatment and stabilization.  This Clinical research associate also supports that recommendation and feels that patient would benefit from further medication management, acute crisis stabilization, and safety within an inpatient psychiatric setting.  Past Medical History:  Past Medical History:  Diagnosis Date   Allergic rhinitis    Chronic tonsillar hypertrophy    ENT did tonsillectomy 2020   Elevated blood pressure reading without diagnosis of hypertension    IFG (impaired fasting glucose) 12/2017   Gluc 107, HbA1c 5.5%   Obesity, Class II, BMI 35-39.9    OSA (obstructive sleep apnea) 02/2018   Sleep study recommended by Dr. Ivory Broad considering it.   Severe persistent asthma  Dr. Sharyn Lull    Past Surgical History:  Procedure Laterality Date   TONSILLECTOMY  06/03/2018   Family History:  Family History   Problem Relation Age of Onset   Heart attack Other    Prostate cancer Other    Hypertension Father    Hypertension Maternal Grandfather    Cancer Maternal Grandfather    Asthma Neg Hx    Social History:  Social History   Substance and Sexual Activity  Alcohol Use Yes   Comment: Rare     Social History   Substance and Sexual Activity  Drug Use No    Social History   Socioeconomic History   Marital status: Single    Spouse name: Not on file   Number of children: Not on file   Years of education: Not on file   Highest education level: Not on file  Occupational History   Not on file  Tobacco Use   Smoking status: Never    Passive exposure: Never   Smokeless tobacco: Never  Vaping Use   Vaping Use: Never used  Substance and Sexual Activity   Alcohol use: Yes    Comment: Rare   Drug use: No   Sexual activity: Yes  Other Topics Concern   Not on file  Social History Narrative   Single, lives with mom in [place].   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   No T/A/Ds.   Social Determinants of Health   Financial Resource Strain: Low Risk  (11/10/2021)   Overall Financial Resource Strain (CARDIA)    Difficulty of Paying Living Expenses: Not hard at all  Food Insecurity: No Food Insecurity (11/10/2021)   Hunger Vital Sign    Worried About Running Out of Food in the Last Year: Never true    Ran Out of Food in the Last Year: Never true  Transportation Needs: No Transportation Needs (11/10/2021)   PRAPARE - Administrator, Civil Service (Medical): No    Lack of Transportation (Non-Medical): No  Physical Activity: Sufficiently Active (11/10/2021)   Exercise Vital Sign    Days of Exercise per Week: 6 days    Minutes of Exercise per Session: 30 min  Stress: Stress Concern Present (11/10/2021)   [employer] of Occupational Health - Occupational Stress Questionnaire    Feeling of Stress : Very much  Social Connections:  Socially Isolated (11/10/2021)   Social Connection and Isolation Panel [NHANES]    Frequency of Communication with Friends and Family: More than three times a week    Frequency of Social Gatherings with Friends and Family: More than three times a week    Attends Religious Services: Never    Database administrator or Organizations: No    Attends Banker Meetings: Never    Marital Status: Never married   Additional Social History:    Allergies:   Allergies  Allergen Reactions   Amoxicillin Shortness Of Breath, Swelling and Other (See Comments)    Labs:  Results for orders placed or performed during the hospital encounter of 10/08/22 (from the past 48 hour(s))  Comprehensive metabolic panel     Status: Abnormal   Collection Time: 10/08/22 12:23 AM  Result Value Ref Range   Sodium 139 135 - 145 mmol/L   Potassium 4.1 3.5 - 5.1 mmol/L   Chloride 107 98 - 111 mmol/L   CO2 19 (L) 22 - 32 mmol/L   Glucose, Bld 106 (H) 70 -  99 mg/dL    Comment: Glucose reference range applies only to samples taken after fasting for at least 8 hours.   BUN 21 (H) 6 - 20 mg/dL   Creatinine, Ser 3.66 0.61 - 1.24 mg/dL   Calcium 44.0 8.9 - 34.7 mg/dL   Total Protein 7.6 6.5 - 8.1 g/dL   Albumin 4.9 3.5 - 5.0 g/dL   AST 25 15 - 41 U/L   ALT 44 0 - 44 U/L   Alkaline Phosphatase 47 38 - 126 U/L   Total Bilirubin 0.5 0.3 - 1.2 mg/dL   GFR, Estimated >42 >59 mL/min    Comment: (NOTE) Calculated using the CKD-EPI Creatinine Equation (2021)    Anion gap 13 5 - 15    Comment: Performed at [facility], [address] [place], [place] [address]  Ethanol     Status: None   Collection Time: 10/08/22 12:23 AM  Result Value Ref Range   Alcohol, Ethyl (B) <10 <10 mg/dL    Comment: (NOTE) Lowest detectable limit for serum alcohol is 10 mg/dL.  For medical purposes only. Performed at [facility], [address], [place], [place] [address]   Urine rapid  drug screen (hosp performed)     Status: Abnormal   Collection Time: 10/08/22 12:23 AM  Result Value Ref Range   Opiates POSITIVE (A) NONE DETECTED   Cocaine NONE DETECTED NONE DETECTED   Benzodiazepines NONE DETECTED NONE DETECTED   Amphetamines NONE DETECTED NONE DETECTED   Tetrahydrocannabinol POSITIVE (A) NONE DETECTED   Barbiturates NONE DETECTED NONE DETECTED    Comment: (NOTE) DRUG SCREEN FOR MEDICAL PURPOSES ONLY.  IF CONFIRMATION IS NEEDED FOR ANY PURPOSE, NOTIFY LAB WITHIN 5 DAYS.  LOWEST DETECTABLE LIMITS FOR URINE DRUG SCREEN Drug Class                     Cutoff (ng/mL) Amphetamine and metabolites    1000 Barbiturate and metabolites    200 Benzodiazepine                 200 Opiates and metabolites        300 Cocaine and metabolites        300 THC                            50 Performed at [facility], [address] [place], [place] [address]   CBC with Diff     Status: Abnormal   Collection Time: 10/08/22 12:23 AM  Result Value Ref Range   WBC 13.9 (H) 4.0 - 10.5 K/uL   RBC 4.81 4.22 - 5.81 MIL/uL   Hemoglobin 14.5 13.0 - 17.0 g/dL   HCT 84.1 66.0 - 63.0 %   MCV 85.4 80.0 - 100.0 fL   MCH 30.1 26.0 - 34.0 pg   MCHC 35.3 30.0 - 36.0 g/dL   RDW 16.0 10.9 - 32.3 %   Platelets 334 150 - 400 K/uL   nRBC 0.0 0.0 - 0.2 %   Neutrophils Relative % 67 %   Neutro Abs 9.3 (H) 1.7 - 7.7 K/uL   Lymphocytes Relative 24 %   Lymphs Abs 3.3 0.7 - 4.0 K/uL   Monocytes Relative 8 %   Monocytes Absolute 1.1 (H) 0.1 - 1.0 K/uL   Eosinophils Relative 1 %   Eosinophils Absolute 0.1 0.0 - 0.5 K/uL   Basophils Relative 0 %   Basophils Absolute 0.1 0.0 -  0.1 K/uL   Immature Granulocytes 0 %   Abs Immature Granulocytes 0.06 0.00 - 0.07 K/uL    Comment: Performed at [facility], [address], [place], [place] [address]  Acetaminophen level     Status: Abnormal   Collection Time: 10/08/22 12:23 AM  Result Value Ref Range    Acetaminophen (Tylenol), Serum <10 (L) 10 - 30 ug/mL    Comment: (NOTE) Therapeutic concentrations vary significantly. A range of 10-30 ug/mL  may be an effective concentration for many patients. However, some  are best treated at concentrations outside of this range. Acetaminophen concentrations >150 ug/mL at 4 hours after ingestion  and >50 ug/mL at 12 hours after ingestion are often associated with  toxic reactions.  Performed at [facility], [address] [place], [place] [address]   Salicylate level     Status: Abnormal   Collection Time: 10/08/22 12:23 AM  Result Value Ref Range   Salicylate Lvl <7.0 (L) 7.0 - 30.0 mg/dL    Comment: Performed at [facility], [address], [place], [place] [address]  Urinalysis, Routine w reflex microscopic -Urine, Clean Catch     Status: Abnormal   Collection Time: 10/08/22  1:57 AM  Result Value Ref Range   Color, Urine ORANGE (A) YELLOW    Comment: BIOCHEMICALS MAY BE AFFECTED BY COLOR   APPearance CLOUDY (A) CLEAR   Specific Gravity, Urine 1.029 1.005 - 1.030   pH 5.5 5.0 - 8.0   Glucose, UA NEGATIVE NEGATIVE mg/dL   Hgb urine dipstick TRACE (A) NEGATIVE   Bilirubin Urine NEGATIVE NEGATIVE   Ketones, ur TRACE (A) NEGATIVE mg/dL   Protein, ur 30 (A) NEGATIVE mg/dL   Nitrite NEGATIVE NEGATIVE   Leukocytes,Ua NEGATIVE NEGATIVE   RBC / HPF 0-5 0 - 5 RBC/hpf   WBC, UA 0-5 0 - 5 WBC/hpf   Bacteria, UA NONE SEEN NONE SEEN   Squamous Epithelial / HPF 0-5 0 - 5 /HPF   Mucus PRESENT     Comment: Performed at [facility], [address] [place], [place] [address]  CK     Status: Abnormal   Collection Time: 10/08/22  1:57 AM  Result Value Ref Range   Total CK 488 (H) 49 - 397 U/L    Comment: Performed at [facility], [address] [place], [place], [place] [address]    Medications:  Current Facility-Administered Medications  Medication Dose Route Frequency  Provider Last Rate Last Admin   albuterol (VENTOLIN HFA) 108 (90 Base) MCG/ACT inhaler 2 puff  2 puff Inhalation Q4H PRN Sabas Sous, MD       doxazosin (CARDURA) tablet 1 mg  1 mg Oral Daily Ray, Duwayne Heck, MD       hydrOXYzine (ATARAX) tablet 25 mg  25 mg Oral Q8H PRN Margarita Grizzle, MD   25 mg at 10/08/22 0905   lisinopril (ZESTRIL) tablet 20 mg  20 mg Oral Daily Margarita Grizzle, MD   20 mg at 10/08/22 0905   loratadine (CLARITIN) tablet 10 mg  10 mg Oral Daily Sabas Sous, MD   10 mg at 10/08/22 0905   metoprolol succinate (TOPROL-XL) 24 hr tablet 50 mg  50 mg Oral Daily Sabas Sous, MD   50 mg at 10/08/22 1308   valbenazine (INGREZZA) capsule 40 mg  40 mg Oral Daily Sabas Sous, MD       Current Outpatient Medications  Medication Sig Dispense Refill   atomoxetine (STRATTERA)  40 MG capsule Take 40 mg by mouth every morning.     diphenoxylate-atropine (LOMOTIL) 2.5-0.025 MG tablet PLEASE SEE ATTACHED FOR DETAILED DIRECTIONS     doxazosin (CARDURA) 1 MG tablet Take 1 tablet by mouth daily.     hydrOXYzine (ATARAX) 25 MG tablet Take 25 mg by mouth every 8 (eight) hours as needed for anxiety.     lisinopril (ZESTRIL) 20 MG tablet Take 20 mg by mouth daily.     metoprolol succinate (TOPROL-XL) 50 MG 24 hr tablet Take 100 mg by mouth 2 (two) times daily.     ondansetron (ZOFRAN-ODT) 4 MG disintegrating tablet Take 4 mg by mouth every 8 (eight) hours as needed.     albuterol (VENTOLIN HFA) 108 (90 Base) MCG/ACT inhaler Inhale 2 puffs into the lungs every 4 (four) hours as needed for wheezing or shortness of breath. 18 each 1   benztropine (COGENTIN) 2 MG tablet Take 1 tablet (2 mg total) by mouth 2 (two) times daily. 180 tablet 2   Budeson-Glycopyrrol-Formoterol (BREZTRI AEROSPHERE) 160-9-4.8 MCG/ACT AERO INHALE 2 PUFFS INTO THE LUNGS TWICE A DAY 10.7 g 5   cetirizine (ZYRTEC) 10 MG tablet Take 1 tablet (10 mg total) by mouth daily. 90 tablet 1   Cholecalciferol (VITAMIN D PO) Take  2,000 Units by mouth daily.     cyclobenzaprine (FLEXERIL) 10 MG tablet Take 1 tablet (10 mg total) by mouth 2 (two) times daily as needed for muscle spasms. 30 tablet 0   Omega-3 Fatty Acids (FISH OIL TRIPLE STRENGTH) 1360 MG CAPS Take 1 capsule by mouth daily.     Paliperidone Palmitate ER (INVEGA HAFYERA) 1092 MG/3.5ML SUSY Inject 1,092 mg into the muscle every 6 (six) months. 1092 mL 11   traZODone (DESYREL) 50 MG tablet Take 1 tablet (50 mg total) by mouth at bedtime for 7 days. 7 tablet 0   WEGOVY 0.25 MG/0.5ML SOAJ Inject 0.25 mg into the skin once a week.     WEGOVY 1 MG/0.5ML SOAJ Inject 1 mg into the skin once a week.      Psychiatric Specialty Exam:  Presentation  General Appearance:  Appropriate for Environment  Eye Contact: Good  Speech: Clear and Coherent  Speech Volume: Normal  Handedness: Right   Mood and Affect  Mood: Depressed  Affect: Blunt   Thought Process  Thought Processes: Coherent  Descriptions of Associations:Intact  Orientation:Full (Time, Place and Person)  Thought Content:WDL  History of Schizophrenia/Schizoaffective disorder:Yes  Duration of Psychotic Symptoms:Greater than six months  Hallucinations:Hallucinations: None  Ideas of Reference:None  Suicidal Thoughts:Suicidal Thoughts: Yes, Active SI Active Intent and/or Plan: With Plan (cut himself with a knife)  Homicidal Thoughts:Homicidal Thoughts: No   Sensorium  Memory: Immediate Good  Judgment: Fair  Insight: Good   Executive Functions  Concentration: Good  Attention Span: Good  Recall: Good  Fund of Knowledge: Good  Language: Good   Psychomotor Activity  Psychomotor Activity: Psychomotor Activity: Normal   Assets  Assets: Communication Skills; Resilience; Desire for Improvement; Vocational/Educational; Housing   Sleep  Sleep: Sleep: Poor Number of Hours of Sleep: 0 (few hours)    Physical Exam: Physical Exam Speaking in clear  sentences. Breathing pattern is audibly normal.  Patient is asking and responding to questions appropriately.  Review of Systems  Psychiatric/Behavioral:  Positive for depression and suicidal ideas. The patient has insomnia.     Blood pressure 139/80, pulse (!) 114, temperature 97.7 F (36.5 C), temperature source Oral, resp. rate (!) 22, SpO2  99 %. There is no height or weight on file to calculate BMI.  Treatment Plan Summary: Patient has been accepted to [facility] psychiatric [facility] facility and will be transferred today.  Disposition: Recommend psychiatric Inpatient admission when medically cleared.  This service was provided via telemedicine using a 2-way, interactive audio and video technology.  Names of all persons participating in this telemedicine service and their role in this encounter. Name: Godfrey Pick. Tiburcio Pea, NP Role: Nurse Practitioner   Name: [name] Role: Patient   Name: [name] Role:Mother     Joaquin Courts, NP 10/08/2022 11:41 AM

## 2022-10-08 NOTE — ED Notes (Signed)
TTS ccart in the room

## 2022-10-09 ENCOUNTER — Inpatient Hospital Stay (HOSPITAL_COMMUNITY): Payer: No Typology Code available for payment source

## 2022-10-09 ENCOUNTER — Encounter (HOSPITAL_COMMUNITY): Payer: Self-pay | Admitting: Family Medicine

## 2022-10-09 ENCOUNTER — Other Ambulatory Visit: Payer: Self-pay

## 2022-10-09 ENCOUNTER — Observation Stay (HOSPITAL_COMMUNITY)
Admission: AD | Admit: 2022-10-09 | Discharge: 2022-10-10 | Disposition: A | Discharge status: Home / Self Care | Payer: No Typology Code available for payment source | Source: Other Acute Inpatient Hospital | Attending: Internal Medicine | Admitting: Internal Medicine

## 2022-10-09 DIAGNOSIS — E860 Dehydration: Secondary | ICD-10-CM | POA: Diagnosis present

## 2022-10-09 DIAGNOSIS — Z79899 Other long term (current) drug therapy: Secondary | ICD-10-CM | POA: Insufficient documentation

## 2022-10-09 DIAGNOSIS — G4733 Obstructive sleep apnea (adult) (pediatric): Secondary | ICD-10-CM | POA: Diagnosis present

## 2022-10-09 DIAGNOSIS — F5105 Insomnia due to other mental disorder: Secondary | ICD-10-CM | POA: Diagnosis present

## 2022-10-09 DIAGNOSIS — F419 Anxiety disorder, unspecified: Secondary | ICD-10-CM | POA: Diagnosis present

## 2022-10-09 DIAGNOSIS — I2699 Other pulmonary embolism without acute cor pulmonale: Principal | ICD-10-CM | POA: Diagnosis present

## 2022-10-09 DIAGNOSIS — K5903 Drug induced constipation: Secondary | ICD-10-CM | POA: Diagnosis present

## 2022-10-09 DIAGNOSIS — I10 Essential (primary) hypertension: Secondary | ICD-10-CM | POA: Diagnosis not present

## 2022-10-09 DIAGNOSIS — R29898 Other symptoms and signs involving the musculoskeletal system: Secondary | ICD-10-CM | POA: Diagnosis present

## 2022-10-09 DIAGNOSIS — Z86711 Personal history of pulmonary embolism: Secondary | ICD-10-CM

## 2022-10-09 DIAGNOSIS — J4550 Severe persistent asthma, uncomplicated: Secondary | ICD-10-CM | POA: Diagnosis present

## 2022-10-09 DIAGNOSIS — Z880 Allergy status to penicillin: Secondary | ICD-10-CM | POA: Diagnosis not present

## 2022-10-09 DIAGNOSIS — T50995A Adverse effect of other drugs, medicaments and biological substances, initial encounter: Secondary | ICD-10-CM | POA: Diagnosis present

## 2022-10-09 DIAGNOSIS — F251 Schizoaffective disorder, depressive type: Secondary | ICD-10-CM | POA: Diagnosis present

## 2022-10-09 DIAGNOSIS — R000 Tachycardia, unspecified: Secondary | ICD-10-CM | POA: Diagnosis present

## 2022-10-09 DIAGNOSIS — Z8249 Family history of ischemic heart disease and other diseases of the circulatory system: Secondary | ICD-10-CM | POA: Diagnosis not present

## 2022-10-09 DIAGNOSIS — Z6841 Body Mass Index (BMI) 40.0 and over, adult: Secondary | ICD-10-CM | POA: Diagnosis not present

## 2022-10-09 DIAGNOSIS — R531 Weakness: Secondary | ICD-10-CM | POA: Insufficient documentation

## 2022-10-09 DIAGNOSIS — J45909 Unspecified asthma, uncomplicated: Secondary | ICD-10-CM | POA: Insufficient documentation

## 2022-10-09 DIAGNOSIS — R748 Abnormal levels of other serum enzymes: Secondary | ICD-10-CM | POA: Insufficient documentation

## 2022-10-09 DIAGNOSIS — R32 Unspecified urinary incontinence: Secondary | ICD-10-CM | POA: Diagnosis present

## 2022-10-09 DIAGNOSIS — F17210 Nicotine dependence, cigarettes, uncomplicated: Secondary | ICD-10-CM | POA: Insufficient documentation

## 2022-10-09 DIAGNOSIS — I2609 Other pulmonary embolism with acute cor pulmonale: Secondary | ICD-10-CM | POA: Diagnosis not present

## 2022-10-09 DIAGNOSIS — E669 Obesity, unspecified: Secondary | ICD-10-CM | POA: Diagnosis present

## 2022-10-09 DIAGNOSIS — Z8042 Family history of malignant neoplasm of prostate: Secondary | ICD-10-CM

## 2022-10-09 DIAGNOSIS — R45851 Suicidal ideations: Secondary | ICD-10-CM | POA: Diagnosis not present

## 2022-10-09 LAB — POCT I-STAT EG7
Acid-base deficit: 2 mmol/L (ref 0.0–2.0)
Bicarbonate: 21.7 mmol/L (ref 20.0–28.0)
Calcium, Ion: 1.26 mmol/L (ref 1.15–1.40)
HCT: 41 % (ref 39.0–52.0)
Hemoglobin: 13.9 g/dL (ref 13.0–17.0)
O2 Saturation: 99 %
Potassium: 4.3 mmol/L (ref 3.5–5.1)
Sodium: 140 mmol/L (ref 135–145)
TCO2: 23 mmol/L (ref 22–32)
pCO2, Ven: 33.9 mmHg — ABNORMAL LOW (ref 44–60)
pH, Ven: 7.414 (ref 7.25–7.43)
pO2, Ven: 144 mmHg — ABNORMAL HIGH (ref 32–45)

## 2022-10-09 LAB — BASIC METABOLIC PANEL
Anion gap: 18 — ABNORMAL HIGH (ref 5–15)
BUN: 20 mg/dL (ref 6–20)
CO2: 19 mmol/L — ABNORMAL LOW (ref 22–32)
Calcium: 10.5 mg/dL — ABNORMAL HIGH (ref 8.9–10.3)
Chloride: 102 mmol/L (ref 98–111)
Creatinine, Ser: 1.3 mg/dL — ABNORMAL HIGH (ref 0.61–1.24)
GFR, Estimated: >60 mL/min (ref >60)
Glucose, Bld: 100 mg/dL — ABNORMAL HIGH (ref 70–99)
Potassium: 4 mmol/L (ref 3.5–5.1)
Sodium: 139 mmol/L (ref 135–145)

## 2022-10-09 LAB — HEPARIN LEVEL (UNFRACTIONATED)
Heparin Unfractionated: 0.28 IU/mL — ABNORMAL LOW (ref 0.30–0.70)
Heparin Unfractionated: 0.2 IU/mL — ABNORMAL LOW (ref 0.30–0.70)

## 2022-10-09 LAB — COMPREHENSIVE METABOLIC PANEL
ALT: 43 U/L (ref 0–44)
AST: 41 U/L (ref 15–41)
Albumin: 4.1 g/dL (ref 3.5–5.0)
Alkaline Phosphatase: 44 U/L (ref 38–126)
Anion gap: 14 (ref 5–15)
BUN: 20 mg/dL (ref 6–20)
CO2: 19 mmol/L — ABNORMAL LOW (ref 22–32)
Calcium: 10.1 mg/dL (ref 8.9–10.3)
Chloride: 105 mmol/L (ref 98–111)
Creatinine, Ser: 1.21 mg/dL (ref 0.61–1.24)
GFR, Estimated: >60 mL/min (ref >60)
Glucose, Bld: 103 mg/dL — ABNORMAL HIGH (ref 70–99)
Potassium: 5.1 mmol/L (ref 3.5–5.1)
Sodium: 138 mmol/L (ref 135–145)
Total Bilirubin: 2.1 mg/dL — ABNORMAL HIGH (ref 0.3–1.2)
Total Protein: 7.1 g/dL (ref 6.5–8.1)

## 2022-10-09 LAB — CBC WITH DIFFERENTIAL/PLATELET
Abs Immature Granulocytes: 0.06 10*3/uL (ref 0.00–0.07)
Basophils Absolute: 0.1 10*3/uL (ref 0.0–0.1)
Basophils Relative: 1 %
Eosinophils Absolute: 0.1 10*3/uL (ref 0.0–0.5)
Eosinophils Relative: 0 %
HCT: 40 % (ref 39.0–52.0)
Hemoglobin: 13.8 g/dL (ref 13.0–17.0)
Immature Granulocytes: 1 %
Lymphocytes Relative: 22 %
Lymphs Abs: 2.8 10*3/uL (ref 0.7–4.0)
MCH: 30.4 pg (ref 26.0–34.0)
MCHC: 34.5 g/dL (ref 30.0–36.0)
MCV: 88.1 fL (ref 80.0–100.0)
Monocytes Absolute: 1.1 10*3/uL — ABNORMAL HIGH (ref 0.1–1.0)
Monocytes Relative: 8 %
Neutro Abs: 8.7 10*3/uL — ABNORMAL HIGH (ref 1.7–7.7)
Neutrophils Relative %: 68 %
Platelets: 286 10*3/uL (ref 150–400)
RBC: 4.54 MIL/uL (ref 4.22–5.81)
RDW: 12.3 % (ref 11.5–15.5)
WBC: 12.7 10*3/uL — ABNORMAL HIGH (ref 4.0–10.5)
nRBC: 0 % (ref 0.0–0.2)

## 2022-10-09 LAB — CBC
HCT: 37.6 % — ABNORMAL LOW (ref 39.0–52.0)
Hemoglobin: 12.8 g/dL — ABNORMAL LOW (ref 13.0–17.0)
MCH: 30 pg (ref 26.0–34.0)
MCHC: 34 g/dL (ref 30.0–36.0)
MCV: 88.1 fL (ref 80.0–100.0)
Platelets: 248 10*3/uL (ref 150–400)
RBC: 4.27 MIL/uL (ref 4.22–5.81)
RDW: 12.4 % (ref 11.5–15.5)
WBC: 12 10*3/uL — ABNORMAL HIGH (ref 4.0–10.5)
nRBC: 0 % (ref 0.0–0.2)

## 2022-10-09 LAB — PROTIME-INR
INR: 1.1 (ref 0.8–1.2)
Prothrombin Time: 14 seconds (ref 11.4–15.2)

## 2022-10-09 LAB — URINALYSIS, ROUTINE W REFLEX MICROSCOPIC
Bilirubin Urine: NEGATIVE
Glucose, UA: NEGATIVE mg/dL
Hgb urine dipstick: NEGATIVE
Ketones, ur: 5 mg/dL — AB
Leukocytes,Ua: NEGATIVE
Nitrite: NEGATIVE
Protein, ur: NEGATIVE mg/dL
Specific Gravity, Urine: >1.046 — ABNORMAL HIGH (ref 1.005–1.030)
pH: 5 (ref 5.0–8.0)

## 2022-10-09 LAB — TROPONIN I (HIGH SENSITIVITY)
Troponin I (High Sensitivity): 6 ng/L (ref <18)
Troponin I (High Sensitivity): 7 ng/L (ref <18)

## 2022-10-09 LAB — D-DIMER, QUANTITATIVE: D-Dimer, Quant: 1.73 ug/mL-FEU — ABNORMAL HIGH (ref 0.00–0.50)

## 2022-10-09 LAB — SARS CORONAVIRUS 2 BY RT PCR: SARS Coronavirus 2 by RT PCR: NEGATIVE

## 2022-10-09 MED ORDER — HEPARIN BOLUS VIA INFUSION
2500.0000 [IU] | Freq: Once | INTRAVENOUS | Status: AC
  Administered 2022-10-09: 2500 [IU] via INTRAVENOUS
  Filled 2022-10-09: qty 2500

## 2022-10-09 MED ORDER — SODIUM CHLORIDE 0.9 % IV SOLN: INTRAVENOUS | Status: DC

## 2022-10-09 MED ORDER — HEPARIN BOLUS VIA INFUSION
7500.0000 [IU] | Freq: Once | INTRAVENOUS | Status: AC
  Administered 2022-10-09: 7500 [IU] via INTRAVENOUS
  Filled 2022-10-09: qty 7500

## 2022-10-09 MED ORDER — ALUM & MAG HYDROXIDE-SIMETH 200-200-20 MG/5ML PO SUSP: 30.0000 mL | ORAL | Status: DC | PRN

## 2022-10-09 MED ORDER — LACTATED RINGERS IV BOLUS
1000.0000 mL | Freq: Once | INTRAVENOUS | Status: AC
  Administered 2022-10-09: 1000 mL via INTRAVENOUS

## 2022-10-09 MED ORDER — OLANZAPINE 5 MG PO TBDP: 5.0000 mg | ORAL_TABLET | Freq: Three times a day (TID) | ORAL | Status: DC | PRN

## 2022-10-09 MED ORDER — BENZTROPINE MESYLATE 2 MG PO TABS
2.0000 mg | ORAL_TABLET | Freq: Two times a day (BID) | ORAL | Status: DC
  Administered 2022-10-09: 2 mg via ORAL
  Filled 2022-10-09 (×2): qty 1

## 2022-10-09 MED ORDER — PROPRANOLOL HCL 10 MG PO TABS: 40.0000 mg | ORAL_TABLET | Freq: Two times a day (BID) | ORAL | Status: DC

## 2022-10-09 MED ORDER — ALBUTEROL SULFATE HFA 108 (90 BASE) MCG/ACT IN AERS: 2.0000 | INHALATION_SPRAY | RESPIRATORY_TRACT | Status: DC | PRN

## 2022-10-09 MED ORDER — HEPARIN BOLUS VIA INFUSION
1700.0000 [IU] | Freq: Once | INTRAVENOUS | Status: AC
  Administered 2022-10-09: 1700 [IU] via INTRAVENOUS
  Filled 2022-10-09: qty 1700

## 2022-10-09 MED ORDER — ZIPRASIDONE MESYLATE 20 MG IM SOLR: 20.0000 mg | INTRAMUSCULAR | Status: DC | PRN

## 2022-10-09 MED ORDER — BENZTROPINE MESYLATE 2 MG PO TABS
2.0000 mg | ORAL_TABLET | Freq: Two times a day (BID) | ORAL | Status: DC
  Administered 2022-10-10 (×2): 2 mg via ORAL
  Filled 2022-10-09 (×3): qty 1

## 2022-10-09 MED ORDER — HYDROXYZINE HCL 25 MG PO TABS
25.0000 mg | ORAL_TABLET | Freq: Three times a day (TID) | ORAL | Status: DC | PRN
  Administered 2022-10-10: 25 mg via ORAL
  Filled 2022-10-09: qty 1

## 2022-10-09 MED ORDER — HEPARIN (PORCINE) 25000 UT/250ML-% IV SOLN
2300.0000 [IU]/h | INTRAVENOUS | Status: DC
  Administered 2022-10-09: 1900 [IU]/h via INTRAVENOUS
  Filled 2022-10-09 (×2): qty 250

## 2022-10-09 MED ORDER — IOHEXOL 350 MG/ML SOLN
75.0000 mL | Freq: Once | INTRAVENOUS | Status: AC | PRN
  Administered 2022-10-09: 75 mL via INTRAVENOUS

## 2022-10-09 MED ORDER — LORATADINE 10 MG PO TABS
10.0000 mg | ORAL_TABLET | Freq: Every day | ORAL | Status: DC
  Administered 2022-10-10: 10 mg via ORAL
  Filled 2022-10-09: qty 1

## 2022-10-09 MED ORDER — ACETAMINOPHEN 325 MG PO TABS: 650.0000 mg | ORAL_TABLET | Freq: Four times a day (QID) | ORAL | Status: DC | PRN

## 2022-10-09 MED ORDER — ONDANSETRON HCL 4 MG PO TABS: 4.0000 mg | ORAL_TABLET | Freq: Four times a day (QID) | ORAL | Status: DC | PRN

## 2022-10-09 MED ORDER — TRAZODONE HCL 100 MG PO TABS
100.0000 mg | ORAL_TABLET | Freq: Every evening | ORAL | Status: DC | PRN
  Administered 2022-10-10: 100 mg via ORAL
  Filled 2022-10-09: qty 4
  Filled 2022-10-09: qty 1

## 2022-10-09 MED ORDER — LORAZEPAM 1 MG PO TABS: 1.0000 mg | ORAL_TABLET | ORAL | Status: DC | PRN

## 2022-10-09 MED ORDER — PROPRANOLOL HCL 20 MG PO TABS
20.0000 mg | ORAL_TABLET | Freq: Two times a day (BID) | ORAL | Status: DC
  Administered 2022-10-10 (×2): 20 mg via ORAL
  Filled 2022-10-09 (×2): qty 1

## 2022-10-09 MED ORDER — ALBUTEROL SULFATE (2.5 MG/3ML) 0.083% IN NEBU: 2.5000 mg | INHALATION_SOLUTION | RESPIRATORY_TRACT | Status: DC | PRN

## 2022-10-09 MED ORDER — ACETAMINOPHEN 650 MG RE SUPP: 650.0000 mg | Freq: Four times a day (QID) | RECTAL | Status: DC | PRN

## 2022-10-09 MED ORDER — SODIUM CHLORIDE 0.9 % IV BOLUS
1000.0000 mL | Freq: Once | INTRAVENOUS | Status: AC
  Administered 2022-10-09: 1000 mL via INTRAVENOUS

## 2022-10-09 MED ORDER — HEPARIN (PORCINE) 25000 UT/250ML-% IV SOLN
2300.0000 [IU]/h | INTRAVENOUS | Status: DC
  Administered 2022-10-09 – 2022-10-10 (×2): 2300 [IU]/h via INTRAVENOUS
  Filled 2022-10-09 (×2): qty 250

## 2022-10-09 MED ORDER — DOXAZOSIN MESYLATE 1 MG PO TABS
1.0000 mg | ORAL_TABLET | Freq: Every day | ORAL | Status: DC
  Administered 2022-10-10: 1 mg via ORAL
  Filled 2022-10-09: qty 1

## 2022-10-09 MED ORDER — ONDANSETRON HCL 4 MG/2ML IJ SOLN: 4.0000 mg | Freq: Four times a day (QID) | INTRAMUSCULAR | Status: DC | PRN

## 2022-10-09 MED ORDER — PROPRANOLOL HCL 10 MG PO TABS
20.0000 mg | ORAL_TABLET | Freq: Two times a day (BID) | ORAL | Status: DC
  Administered 2022-10-09: 20 mg via ORAL
  Filled 2022-10-09: qty 2

## 2022-10-09 MED ORDER — MAGNESIUM HYDROXIDE 400 MG/5ML PO SUSP: 30.0000 mL | Freq: Every day | ORAL | Status: DC | PRN

## 2022-10-09 MED ORDER — LACTATED RINGERS IV SOLN: INTRAVENOUS | Status: DC

## 2022-10-09 NOTE — ED Notes (Signed)
Tech from [facility] at bedside.

## 2022-10-09 NOTE — ED Notes (Signed)
Per MRI, patient was willing to do the MRI of the brain but refused further MRI imaging that was ordered

## 2022-10-09 NOTE — ED Notes (Signed)
Patient urinated 100 mL. Upon completion bladder scan volume resulted with 0 mL

## 2022-10-09 NOTE — ED Notes (Signed)
Noted elevated heart rate while in room. Message doctor about heart rate and waiting for response. Heart rate noted to be at 120-130 bpm.

## 2022-10-09 NOTE — ED Notes (Signed)
Patient given water and ginger ale. 

## 2022-10-09 NOTE — Progress Notes (Signed)
Subjective:   "Nathaniel [name] is a 22 y.o. male patient admitted to [facility] 10/08/22 with mental health history consisting of schizoaffective disorder with depression, anxiety, but obesity was admitted to [facility] emergency department around midnight with suicidal ideations and had attempted to grab a knife to harm himself in the presence of his mother.  Patient was discharged only yesterday from [facility] facility due to schizoaffective disorder exacerbation and persistent insomnia".   Tonight, this provider received the following report from the patient's RN via phone call  "patient complaining of chest pain, shortness of breath, and tightness in chest that just started, I'm having a heart attack.  Blood pressure 99/67, heart rate 139, O2 sat 97% on room air".  Per RN note "Pt constantly coming up to the nursing station stating various somatic complaints, pt originally stated he could not walk, but was coming up to the nursing station constantly. Pt stated he could not breathe , pt given his inhaler PRN per MAR".    Recommend transfer to [facility] for medical clearance. I spoke to Dr. Manus Gunning at [facility] and the provider has agreed to accept the patient.  Patient may return to [facility] to continue his psychiatric treatment after medical clearance.

## 2022-10-09 NOTE — Assessment & Plan Note (Signed)
Pt with BLL PEs.  Unclear if RHS or not: 1) No RHS on CT scan 2) Trop neg 3) pt with BPs ~100 systolic 4) s.tach to 130s, had this persistently during admit this past week to [facility] 5) S1Q3 but no T3 on EKG today  No O2 requirement.  Discussed with Dr. Merrily Pew with P[facility]M: he thinks that pt just dehydrated and we should give fluid for the soft BPs and tachycardia, doesn't feel pt needs thrombolytics at this time he states. Will order 1L IVF bolus Hold home BP meds, metoprolol, lisinopril Check 2d echo Heparin gtt is going Tele monitor BLE [place] for DVTs

## 2022-10-09 NOTE — ED Notes (Signed)
Bladder scanner not working to measure post void residual

## 2022-10-09 NOTE — ED Triage Notes (Signed)
Patient BIB EMS from [facility] due to chest pain, sob, & generalized weakness. Patient states chest has been hurting for the past couple of hours. Patient states weakness has been going on for 2 weeks and hasn't been able to lift legs. Patient states urinary frequency x2 weeks. Patient lungs clear. Patient states he has not been sleeping well recently. Patient A&Ox4.

## 2022-10-09 NOTE — Assessment & Plan Note (Signed)
Holding all home BP meds given soft BPs in ED + pulmonary emboli.

## 2022-10-09 NOTE — Progress Notes (Signed)
Heart rate is too high for accurate echo at this time. 

## 2022-10-09 NOTE — Progress Notes (Signed)
Mother notified of pt transfer to the hospital , mother stated she was also contacted by the ED doctor.

## 2022-10-09 NOTE — ED Provider Notes (Signed)
[place] EMERGENCY DEPARTMENT AT [facility] Provider Note   CSN: [id number] Arrival date & time: 10/09/22  0051     History  Chief Complaint  Patient presents with   Chest Pain    Nathaniel [name] is a 22 y.o. male.  The history is provided by the patient, medical records and the EMS personnel.  Chest Pain Nathaniel [name] is a 22 y.o. male who presents to the Emergency Department complaining of chest pain.  Patient transferred from behavioral health Hospital for evaluation of chest pain.  He has a history of hypertension, sinus tachycardia, schizoaffective disorder who was admitted to behavioral health when he developed chest pain and was referred to the emergency department for further evaluation.  Patient reports 2 weeks of shortness of breath and cough with several hours of central chest pain.  No reported fevers.  He also reports generalized weakness, greatest in his legs that has been present for several weeks with urinary frequency and difficulty voiding completely.  No loss of bowel.  No abdominal pain, nausea, vomiting.     Home Medications Prior to Admission medications   Medication Sig Start Date End Date Taking? Authorizing Provider  albuterol (VENTOLIN HFA) 108 (90 Base) MCG/ACT inhaler Inhale 2 puffs into the lungs every 4 (four) hours as needed for wheezing or shortness of breath. 08/13/22   Alfonse Spruce, MD  benztropine (COGENTIN) 2 MG tablet Take 1 tablet (2 mg total) by mouth 2 (two) times daily. 09/15/22   Shanna Cisco, NP  Budeson-Glycopyrrol-Formoterol (BREZTRI AEROSPHERE) 160-9-4.8 MCG/ACT AERO INHALE 2 PUFFS INTO THE LUNGS TWICE A DAY 09/15/22   Alfonse Spruce, MD  cetirizine (ZYRTEC) 10 MG tablet Take 1 tablet (10 mg total) by mouth daily. 08/13/22   Alfonse Spruce, MD  Cholecalciferol (VITAMIN D PO) Take 2,000 Units by mouth daily.    [provider]  cyclobenzaprine (FLEXERIL) 10 MG tablet Take 1 tablet (10 mg total) by  mouth 2 (two) times daily as needed for muscle spasms. 10/21/21   Janell Quiet, PA-C  doxazosin (CARDURA) 1 MG tablet Take 1 tablet by mouth daily. 10/08/22 01/06/23  [provider]  hydrOXYzine (ATARAX) 25 MG tablet Take 25 mg by mouth every 8 (eight) hours as needed for anxiety. 10/07/22 01/05/23  [provider]  lisinopril (ZESTRIL) 20 MG tablet Take 20 mg by mouth daily. 10/08/22 10/08/23  [provider]  metoprolol succinate (TOPROL-XL) 50 MG 24 hr tablet Take 100 mg by mouth 2 (two) times daily. 10/07/22 11/06/22  [provider]  Omega-3 Fatty Acids (FISH OIL TRIPLE STRENGTH) 1360 MG CAPS Take 1 capsule by mouth daily. 10/27/21   [provider]  Paliperidone Palmitate ER (INVEGA HAFYERA) 1092 MG/3.5ML SUSY Inject 1,092 mg into the muscle every 6 (six) months. 09/15/22   Shanna Cisco, NP  traZODone (DESYREL) 50 MG tablet Take 1 tablet (50 mg total) by mouth at bedtime for 7 days. 09/30/22 10/07/22  Pollyann Savoy, MD  WEGOVY 1 MG/0.5ML SOAJ Inject 1 mg into the skin once a week. 08/10/22   [provider]      Allergies    Amoxicillin    Review of Systems   Review of Systems  Cardiovascular:  Positive for chest pain.  All other systems reviewed and are negative.   Physical Exam Updated Vital Signs BP (!) 96/54   Pulse (!) 123   Temp 99.3 F (37.4 C) (Oral)   Resp (!) 31  Ht 6\' 1"  (1.854 m)   Wt (!) 140.6 kg   SpO2 97%   BMI 40.90 kg/m  Physical Exam Vitals and nursing note reviewed.  Constitutional:      Appearance: He is well-developed.  HENT:     Head: Normocephalic and atraumatic.  Cardiovascular:     Rate and Rhythm: Regular rhythm. Tachycardia present.     Heart sounds: No murmur heard. Pulmonary:     Effort: Pulmonary effort is normal. No respiratory distress.     Breath sounds: Normal breath sounds.  Abdominal:     Palpations: Abdomen is soft.     Tenderness: There is no abdominal tenderness. There is no  guarding or rebound.  Musculoskeletal:        General: No tenderness.     Comments: 2+ DP pulses bilaterally  Skin:    General: Skin is warm and dry.  Neurological:     Mental Status: He is alert.     Comments: Profound generalized weakness, appears to have an effort component to this weakness but difficult to fully ascertain on examination.  Psychiatric:     Comments: Flat affect     ED Results / Procedures / Treatments   Labs (all labs ordered are listed, but only abnormal results are displayed) Labs Reviewed  COMPREHENSIVE METABOLIC PANEL - Abnormal; Notable for the following components:      Result Value   CO2 19 (*)    Glucose, Bld 103 (*)    Total Bilirubin 2.1 (*)    All other components within normal limits  D-DIMER, QUANTITATIVE - Abnormal; Notable for the following components:   D-Dimer, Quant 1.73 (*)    All other components within normal limits  URINALYSIS, ROUTINE W REFLEX MICROSCOPIC - Abnormal; Notable for the following components:   APPearance HAZY (*)    Specific Gravity, Urine >1.046 (*)    Ketones, ur 5 (*)    All other components within normal limits  CBC WITH DIFFERENTIAL/PLATELET - Abnormal; Notable for the following components:   WBC 12.7 (*)    Neutro Abs 8.7 (*)    Monocytes Absolute 1.1 (*)    All other components within normal limits  BASIC METABOLIC PANEL - Abnormal; Notable for the following components:   CO2 19 (*)    Glucose, Bld 100 (*)    Creatinine, Ser 1.30 (*)    Calcium 10.5 (*)    Anion gap 18 (*)    All other components within normal limits  POCT I-STAT EG7 - Abnormal; Notable for the following components:   pCO2, Ven 33.9 (*)    pO2, Ven 144 (*)    All other components within normal limits  SARS CORONAVIRUS 2 BY RT PCR  PROTIME-INR  CBC WITH DIFFERENTIAL/PLATELET  HEPARIN LEVEL (UNFRACTIONATED)  CBC  I-STAT VENOUS BLOOD GAS, ED  TROPONIN I (HIGH SENSITIVITY)  TROPONIN I (HIGH SENSITIVITY)    EKG EKG  Interpretation  Date/Time:  Friday October 09 2022 01:05:28 EDT Ventricular Rate:  123 PR Interval:  139 QRS Duration: 77 QT Interval:  311 QTC Calculation: 445 R Axis:   75 Text Interpretation: Sinus tachycardia Low voltage, extremity leads Consider anterior infarct ST elevation, consider inferior injury - ST elevation present on multiple previous EKGs Confirmed by Tilden Fossa [phone]) on 10/09/2022 1:07:52 AM  Radiology CT Angio Chest PE W and/or Wo Contrast  Result Date: 10/09/2022 CLINICAL DATA:  Chest pain and shortness of breath EXAM: CT ANGIOGRAPHY CHEST WITH CONTRAST TECHNIQUE: Multidetector CT imaging of  the chest was performed using the standard protocol during bolus administration of intravenous contrast. Multiplanar CT image reconstructions and MIPs were obtained to evaluate the vascular anatomy. RADIATION DOSE REDUCTION: This exam was performed according to the departmental dose-optimization program which includes automated exposure control, adjustment of the mA and/or kV according to patient size and/or use of iterative reconstruction technique. CONTRAST:  75mL OMNIPAQUE IOHEXOL 350 MG/ML SOLN COMPARISON:  Chest x-ray from earlier in the same day. FINDINGS: Cardiovascular: Thoracic aorta and its branches are within normal limits. Pulmonary artery shows a normal branching pattern bilaterally. Filling defects are noted in the lower lobe pulmonary arterial branches right slightly greater than left. No evidence of right heart strain is noted. No coronary calcifications are noted. Mediastinum/Nodes: Thoracic inlet is within normal limits. No hilar or mediastinal adenopathy is noted. The esophagus as visualized is within normal limits. Lungs/Pleura: Lungs are well aerated bilaterally. No focal infiltrate or sizable effusion is seen. No parenchymal nodules are noted. Upper Abdomen: Visualized upper abdomen shows fatty infiltration of the liver. Musculoskeletal: No chest wall abnormality. No acute  or significant osseous findings. Review of the MIP images confirms the above findings. IMPRESSION: Bilateral lower lobe pulmonary emboli without evidence of right heart strain. No other focal abnormality is noted. Critical Value/emergent results were called by telephone at the time of interpretation on 10/09/2022 at 3:30 am to Dr. Tilden Fossa , who verbally acknowledged these results. Electronically Signed   By: Alcide Clever M.D.   On: 10/09/2022 03:32   DG Chest Portable 1 View  Result Date: 10/09/2022 CLINICAL DATA:  Shortness of breath. EXAM: PORTABLE CHEST 1 VIEW COMPARISON:  10/01/2022. FINDINGS: The heart size and mediastinal contours are within normal limits. Lung volumes are low. No consolidation, effusion, or pneumothorax. No acute osseous abnormality. IMPRESSION: No active disease. Electronically Signed   By: Thornell Sartorius M.D.   On: 10/09/2022 01:26    Procedures Procedures   CRITICAL CARE Performed by: Tilden Fossa   Total critical care time: 40 minutes  Critical care time was exclusive of separately billable procedures and treating other patients.  Critical care was necessary to treat or prevent imminent or life-threatening deterioration.  Critical care was time spent personally by me on the following activities: development of treatment plan with patient and/or surrogate as well as nursing, discussions with consultants, evaluation of patient's response to treatment, examination of patient, obtaining history from patient or surrogate, ordering and performing treatments and interventions, ordering and review of laboratory studies, ordering and review of radiographic studies, pulse oximetry and re-evaluation of patient's condition.  Medications Ordered in ED Medications  albuterol (VENTOLIN HFA) 108 (90 Base) MCG/ACT inhaler 2 puff (2 puffs Inhalation Given 10/08/22 2329)  doxazosin (CARDURA) tablet 1 mg (has no administration in time range)  hydrOXYzine (ATARAX) tablet 25 mg (25  mg Oral Given 10/08/22 2055)  loratadine (CLARITIN) tablet 10 mg (has no administration in time range)  acetaminophen (TYLENOL) tablet 650 mg (650 mg Oral Given 10/08/22 2055)  alum & mag hydroxide-simeth (MAALOX/MYLANTA) 200-200-20 MG/5ML suspension 30 mL (has no administration in time range)  magnesium hydroxide (MILK OF MAGNESIA) suspension 30 mL (has no administration in time range)  OLANZapine zydis (ZYPREXA) disintegrating tablet 5 mg (5 mg Oral Given 10/08/22 2211)    And  LORazepam (ATIVAN) tablet 1 mg (1 mg Oral Given 10/08/22 2256)    And  ziprasidone (GEODON) injection 20 mg (has no administration in time range)  traZODone (DESYREL) tablet 100 mg (100 mg  Oral Given 10/08/22 2055)  heparin ADULT infusion 100 units/mL (25000 units/264mL) (1,900 Units/hr Intravenous New Bag/Given 10/09/22 0405)  lactated ringers infusion ( Intravenous New Bag/Given 10/09/22 0455)  iohexol (OMNIPAQUE) 350 MG/ML injection 75 mL (75 mLs Intravenous Contrast Given 10/09/22 0321)  heparin bolus via infusion 7,500 Units (7,500 Units Intravenous Bolus from Bag 10/09/22 0406)  lactated ringers bolus 1,000 mL (1,000 mLs Intravenous New Bag/Given 10/09/22 0608)    ED Course/ Medical Decision Making/ A&P                             Medical Decision Making Amount and/or Complexity of Data Reviewed Labs: ordered. Radiology: ordered.  Risk Prescription drug management. Decision regarding hospitalization.   Patient with history of schizoaffective disorder here for evaluation of shortness of breath, chest pain.  He is found to be tachycardic on examination, tachypneic but not in respiratory distress.  Lungs are clear on examination.  He has profound weakness on examination, unclear if this is secondary to effort versus acute illness.  He was documented to be ambulating without difficulty at behavioral health prior to his ED arrival.  He does not have focal weakness.  EKG is similar when compared to priors.  His troponin is  negative.  D-dimer was obtained and is elevated.  A CTA was obtained, which demonstrates bilateral PEs but no evidence of heart strain.  He is tachycardic-this has been present for some time, unclear if this is secondary to underlying psychiatric illness versus due to his pulmonary embolism.  He was treated with gentle IV fluid hydration as well as heparin for anticoagulation.  Patient updated findings of studies and need for admission for ongoing workup.  Hospitalist consulted for admission for ongoing care.        Final Clinical Impression(s) / ED Diagnoses Final diagnoses:  Acute pulmonary embolism without acute cor pulmonale, unspecified pulmonary embolism type [facility])    Rx / DC Orders ED Discharge Orders     None         Tilden Fossa, MD 10/09/22 [phone]

## 2022-10-09 NOTE — Assessment & Plan Note (Addendum)
My suspicion for an acute neurologic cord syndrome is low (somatization disorder seems more likely based on RN notes from [facility]); however, ill go ahead and get MR brain, C, T, and L spine to rule out any obvious cord / CNS lesions. Had reflexes on exam though hard to get.  GBS possible but seems a little less likely. May want to get neuro consult as next step if MRI neg and weakness persists.

## 2022-10-09 NOTE — ED Notes (Signed)
Patient transported to CT 

## 2022-10-09 NOTE — Progress Notes (Signed)
ANTICOAGULATION CONSULT NOTE - Initial Consult  Pharmacy Consult for heparin Indication: pulmonary embolus  Allergies  Allergen Reactions   Amoxicillin Shortness Of Breath, Swelling and Other (See Comments)    Patient Measurements: Height: 6\' 1"  (185.4 cm) Weight: (Abnormal) 140.6 kg (310 lb) IBW/kg (Calculated) : 79.9 Heparin Dosing Weight: 112 Kg  Vital Signs: Temp: 99.1 F (37.3 C) (06/07 0059) Temp Source: Oral (06/07 0059) BP: 91/61 (06/07 0300) Pulse Rate: 120 (06/07 0300)  Labs: Recent Labs    10/08/22 0023 10/08/22 0157 10/09/22 0102 10/09/22 0136 10/09/22 0249 10/09/22 0302  HGB 14.5  --   --  13.8  --  13.9  HCT 41.1  --   --  40.0  --  41.0  PLT 334  --   --  286  --   --   CREATININE 0.97  --  1.21  --  1.30*  --   CKTOTAL  --  488*  --   --   --   --   TROPONINIHS  --   --  6  --  7  --     Estimated Creatinine Clearance: 129.1 mL/min (A) (by C-G formula based on SCr of 1.3 mg/dL (H)).   Medical History: Past Medical History:  Diagnosis Date   Allergic rhinitis    Chronic tonsillar hypertrophy    ENT did tonsillectomy 2020   Elevated blood pressure reading without diagnosis of hypertension    IFG (impaired fasting glucose) 12/2017   Gluc 107, HbA1c 5.5%   Obesity, Class II, BMI 35-39.9    OSA (obstructive sleep apnea) 02/2018   Sleep study recommended by Dr. Ivory Broad considering it.   Severe persistent asthma    Dr. Sharyn Lull   Assessment: 15 yoM with confirmed bilateral PE. No oral anticoagulation reported prior to admission. CBC WNL. Pharmacy consulted to dose heparin infusion.    Goal of Therapy:  Heparin level 0.3-0.7 units/ml Monitor platelets by anticoagulation protocol: Yes   Plan:  Give 7500 units bolus x 1 Start heparin infusion at 1900 units/hr Check anti-Xa level in 6 hours and daily while on heparin Continue to monitor H&H and platelets  Ruben Im, PharmD Clinical Pharmacist 10/09/2022 3:48 AM Please check AMION for all  [facility] Pharmacy numbers

## 2022-10-09 NOTE — Progress Notes (Signed)
Nathaniel [name]  ZOX:[id number] DOB: 2000-08-14 DOA: 10/08/2022 PCP: [name], MD    Brief Narrative:  22 year old with a complex psychiatric history who was admitted to [facility] 5/30-10/07/2022 with persistent tachycardia and concern for serotonin syndrome.  He was started on metoprolol at that time.  He was transferred to the behavioral health hospital 6/6 where records indicate he very frequently reported different somatic complaints to include bilateral lower extremity paralysis, shortness of breath, and chest pain.  He was ultimately transferred to the ER 6/7 for an evaluation where CTa of the chest demonstrated bilateral lower lobe pulmonary emboli without evidence of right heart strain.  Troponin was normal.  On review of systems the patient's mother also reported intermittent urinary incontinence with generalized weakness for couple of weeks.  Consultants:  Psychiatry  Goals of Care:  Code Status: Full Code   DVT prophylaxis: Heparin drip  Interim Hx: Afebrile since admission.  Persistent sinus tachycardia with heart rate 123-133.  Systolic blood pressures 96-115.  Oxygen saturation 98% room air. Having some ongoing mild central chest pain, but no signif SOB at rest. No new complaints.   Assessment & Plan:  Acute bilateral pulmonary emboli Clinically stable - troponin negative - blood pressure preserved -TTE to evaluate for right heart strain in more detail - check venous duplex for DVTs that might be amenable to intervention - heparin drip until TTE/venous duplex results available then will plan for rapid transition to [facility]  Suicidal ideation -schizoaffective disorder with depression/anxiety This is reportedly the diagnosis under which the patient was admitted to behavioral health, though it was done so voluntarily - Psych to follow while inpatient -continue prior psych meds  Reported bilateral lower extremity weakness Somatization disorder versus actual  neurologic issue - MRI pending  Obesity - Body mass index is 40.9 kg/m.   Family Communication: Spoke with the patient's mother at bedside Disposition: Anticipate medical stability for discharge in approximately 24 hours   Objective: Blood pressure 115/69, pulse (!) 131, temperature 99.3 F (37.4 C), temperature source Oral, resp. rate (!) 28, height 6\' 1"  (1.854 m), weight (!) 140.6 kg, SpO2 99 %.  Intake/Output Summary (Last 24 hours) at 10/09/2022 0758 Last data filed at 10/09/2022 8119 Gross per 24 hour  Intake --  Output 150 ml  Net -150 ml   Filed Weights   10/08/22 1439 10/09/22 0102  Weight: (!) 141 kg (!) 140.6 kg    Examination: General: No acute respiratory distress Lungs: Clear to auscultation bilaterally without wheezes or crackles Cardiovascular: Tachycardic but regular without murmur or rub Abdomen: Nontender, nondistended, soft, bowel sounds positive, no rebound, no ascites, no appreciable mass Extremities: No significant cyanosis, clubbing, or edema bilateral lower extremities  CBC: Recent Labs  Lab 10/08/22 0023 10/09/22 0136 10/09/22 0302  WBC 13.9* 12.7*  --   NEUTROABS 9.3* 8.7*  --   HGB 14.5 13.8 13.9  HCT 41.1 40.0 41.0  MCV 85.4 88.1  --   PLT 334 286  --    Basic Metabolic Panel: Recent Labs  Lab 10/08/22 0023 10/09/22 0102 10/09/22 0249 10/09/22 0302  NA 139 138 139 140  K 4.1 5.1 4.0 4.3  CL 107 105 102  --   CO2 19* 19* 19*  --   GLUCOSE 106* 103* 100*  --   BUN 21* 20 20  --   CREATININE 0.97 1.21 1.30*  --   CALCIUM 10.1 10.1 10.5*  --    GFR: Estimated Creatinine  Clearance: 129.1 mL/min (A) (by C-G formula based on SCr of 1.3 mg/dL (H)).   Scheduled Meds:  doxazosin  1 mg Oral Daily   loratadine  10 mg Oral Daily   Continuous Infusions:  heparin 1,900 Units/hr (10/09/22 0405)   lactated ringers 75 mL/hr at 10/09/22 0455     LOS: 1 day   Lonia Blood, MD [facility] Office  [phone] Pager -  Text Page per Loretha Stapler  If 7PM-7AM, please contact night-coverage per Amion 10/09/2022, 7:58 AM

## 2022-10-09 NOTE — Progress Notes (Signed)
Lower extremity venous bilateral study completed.   Please see CV Proc for preliminary results.   Amilah Greenspan, RDMS, RVT  

## 2022-10-09 NOTE — Assessment & Plan Note (Addendum)
Currently here voluntarily. Management of psych issues / meds per psychiatry. Have put consult order to them into epic under new encounter. Will continue current psych meds that he was on over at [facility] Looks like they just have him on PRN meds for agitation

## 2022-10-09 NOTE — Assessment & Plan Note (Deleted)
Currently here voluntarily. Also seems to be intermittently having what I suspect is psychogenic, non neurologic bilateral lower extremity paralysis: as per RN note from earlier this evening: "Pt constantly coming up to the nursing station stating various somatic complaints, pt originally stated he could not walk, but was coming up to the nursing station constantly." Management per psychiatry.

## 2022-10-09 NOTE — Progress Notes (Signed)
ANTICOAGULATION CONSULT NOTE  Pharmacy Consult for heparin Indication: pulmonary embolus  Allergies  Allergen Reactions   Amoxicillin Shortness Of Breath, Swelling and Other (See Comments)    Patient Measurements: Height: 6\' 1"  (185.4 cm) Weight: (!) 140.6 kg (310 lb) IBW/kg (Calculated) : 79.9 Heparin Dosing Weight: 112 Kg  Vital Signs: Temp: 98.6 F (37 C) (06/07 1547) Temp Source: Oral (06/07 1547) BP: 113/70 (06/07 1745) Pulse Rate: 113 (06/07 1947)  Labs: Recent Labs    10/08/22 0023 10/08/22 0157 10/09/22 0102 10/09/22 0136 10/09/22 0249 10/09/22 0302 10/09/22 0344 10/09/22 1108 10/09/22 1923  HGB 14.5  --   --  13.8  --  13.9  --  12.8*  --   HCT 41.1  --   --  40.0  --  41.0  --  37.6*  --   PLT 334  --   --  286  --   --   --  248  --   LABPROT  --   --   --   --   --   --  14.0  --   --   INR  --   --   --   --   --   --  1.1  --   --   HEPARINUNFRC  --   --   --   --   --   --   --  0.28* 0.20*  CREATININE 0.97  --  1.21  --  1.30*  --   --   --   --   CKTOTAL  --  488*  --   --   --   --   --   --   --   TROPONINIHS  --   --  6  --  7  --   --   --   --      Estimated Creatinine Clearance: 129.1 mL/min (A) (by C-G formula based on SCr of 1.3 mg/dL (H)).   Medical History: Past Medical History:  Diagnosis Date   Allergic rhinitis    Chronic tonsillar hypertrophy    ENT did tonsillectomy 2020   Elevated blood pressure reading without diagnosis of hypertension    IFG (impaired fasting glucose) 12/2017   Gluc 107, HbA1c 5.5%   Obesity, Class II, BMI 35-39.9    OSA (obstructive sleep apnea) 02/2018   Sleep study recommended by Dr. Ivory Broad considering it.   Severe persistent asthma    Dr. Sharyn Lull   Assessment:   66 yoM with confirmed bilateral PE. No oral anticoagulation reported prior to admission. CBC WNL. Pharmacy consulted to dose heparin infusion.   Heparin level subtherapeutic at 0.20 while on heparin 2100u/hr. No pauses per nursing,  was drawn from opposite arm as a peripheral stick.   Goal of Therapy:  Heparin level 0.3-0.7 units/ml Monitor platelets by anticoagulation protocol: Yes   Plan:  Give 2500 (~22u/kg) units bolus x 1 Increase heparin infusion to 2300 (~21u/kg) units/hr Check anti-Xa level in 6 hours and daily while on heparin Continue to monitor H&H and platelets  Estill Batten, PharmD, BCCCP  10/09/2022 8:01 PM

## 2022-10-09 NOTE — ED Notes (Signed)
Lab stated they couldn't run CBC, another tube sent down

## 2022-10-09 NOTE — Telephone Encounter (Signed)
Provider attempted to call patients mother without success. Voicemail left.

## 2022-10-09 NOTE — Assessment & Plan Note (Signed)
Currently here voluntarily. Also seems to be intermittently having what I suspect is psychogenic, non neurologic bilateral lower extremity paralysis: as per RN note from earlier this evening: "Pt constantly coming up to the nursing station stating various somatic complaints, pt originally stated he could not walk, but was coming up to the nursing station constantly." Management of psych issues per psychiatry. Will continue current psych meds that he was on over at [facility]

## 2022-10-09 NOTE — Progress Notes (Signed)
ANTICOAGULATION CONSULT NOTE  Pharmacy Consult for heparin Indication: pulmonary embolus  Allergies  Allergen Reactions   Amoxicillin Shortness Of Breath, Swelling and Other (See Comments)    Patient Measurements: Height: 6\' 1"  (185.4 cm) Weight: (!) 140.6 kg (310 lb) IBW/kg (Calculated) : 79.9 Heparin Dosing Weight: 112 Kg  Vital Signs: Temp: 98.1 F (36.7 C) (06/07 0804) Temp Source: Oral (06/07 0804) BP: 99/74 (06/07 0804) Pulse Rate: 128 (06/07 0804)  Labs: Recent Labs    10/08/22 0023 10/08/22 0157 10/09/22 0102 10/09/22 0136 10/09/22 0249 10/09/22 0302 10/09/22 0344  HGB 14.5  --   --  13.8  --  13.9  --   HCT 41.1  --   --  40.0  --  41.0  --   PLT 334  --   --  286  --   --   --   LABPROT  --   --   --   --   --   --  14.0  INR  --   --   --   --   --   --  1.1  CREATININE 0.97  --  1.21  --  1.30*  --   --   CKTOTAL  --  488*  --   --   --   --   --   TROPONINIHS  --   --  6  --  7  --   --      Estimated Creatinine Clearance: 129.1 mL/min (A) (by C-G formula based on SCr of 1.3 mg/dL (H)).   Medical History: Past Medical History:  Diagnosis Date   Allergic rhinitis    Chronic tonsillar hypertrophy    ENT did tonsillectomy 2020   Elevated blood pressure reading without diagnosis of hypertension    IFG (impaired fasting glucose) 12/2017   Gluc 107, HbA1c 5.5%   Obesity, Class II, BMI 35-39.9    OSA (obstructive sleep apnea) 02/2018   Sleep study recommended by Dr. Ivory Broad considering it.   Severe persistent asthma    Dr. Sharyn Lull   Assessment: 72 yoM with confirmed bilateral PE. No oral anticoagulation reported prior to admission. CBC WNL. Pharmacy consulted to dose heparin infusion.   Heparin level slightly subtherapeutic at 0.28. Hgb 12.8, plt wnl. No signs of overt bleeding noted.   Goal of Therapy:  Heparin level 0.3-0.7 units/ml Monitor platelets by anticoagulation protocol: Yes   Plan:  Give 1700 units bolus x 1 Increase heparin  infusion to 2100 units/hr Check anti-Xa level in 6 hours and daily while on heparin Continue to monitor H&H and platelets  Andreas Ohm, PharmD Pharmacy Resident  10/09/2022 8:08 AM

## 2022-10-09 NOTE — Assessment & Plan Note (Deleted)
Currently here voluntarily. Also seems to be intermittently having what I suspect is psychogenic, non neurologic bilateral lower extremity paralysis: as per RN note from earlier this evening: "Pt constantly coming up to the nursing station stating various somatic complaints, pt originally stated he could not walk, but was coming up to the nursing station constantly." Management of psych issues per psychiatry. Will continue current psych meds that he was on over at [facility]

## 2022-10-09 NOTE — Progress Notes (Signed)
Pt taken to [facility] ED for medical clearance

## 2022-10-09 NOTE — Progress Notes (Incomplete)
Received the following report from the patient's RN ' patient complaining of chest pain, shortness of breath, and tightness in chest that just started.  Blood pressure 99/67, heart rate 139, O2 sat 97% on room air".

## 2022-10-09 NOTE — H&P (Addendum)
History and Physical    Patient: Nathaniel [name] NGE:[id number] DOB: 2001/03/28 DOA: (Not on file) DOS: the patient was seen and examined on 10/09/2022 PCP: [name], MD  Patient coming from: [facility]  Chief Complaint: Chest pain, SOB HPI: Nathaniel [name] is a 22 y.o. male with medical history significant of multiple psychiatric issues.  Pt with recent admit to [facility] 5/30-6/5 for concern for serotonin syndrome.  Ultimately felt to be psychiatric disorders and not serotonin syndrome.  Persistently tachycardic during that admit, they started him on metoprolol.  Pt over to [facility] 6/6.  At [facility] per RN: "Pt constantly coming up to the nursing station stating various somatic complaints, pt originally stated he could not walk, but was coming up to the nursing station constantly."  While the BLE paralysis was felt to be somatization (given that he was then shortly thereafter noted to be ambulating unassisted), pt also complaining of SOB and CP which was of some concern.  Therefore pt transferred to ED at about 0100 this AM.  Persistently tachycardic in ED, CTA chest performed which demonstrates BLL PEs without evidence of RHS.  Trop Nl.  No fevers, has had generalized weakness with SOB for past several weeks he says.  No abd pain, N/V.  CP onset several hours ago per patient.  Per mother: pt also having urinary incontinence, generalized weakness for past couple of weeks as well which is new.   Review of Systems: As mentioned in the history of present illness. All other systems reviewed and are negative. Past Medical History:  Diagnosis Date   Allergic rhinitis    Chronic tonsillar hypertrophy    ENT did tonsillectomy 2020   Elevated blood pressure reading without diagnosis of hypertension    IFG (impaired fasting glucose) 12/2017   Gluc 107, HbA1c 5.5%   Obesity, Class II, BMI 35-39.9    OSA (obstructive sleep apnea) 02/2018   Sleep study recommended by Dr. Ivory Broad considering it.   Severe  persistent asthma    Dr. Sharyn Lull   Past Surgical History:  Procedure Laterality Date   TONSILLECTOMY  06/03/2018   Social History:  reports that he has been smoking cigarettes. He has been smoking an average of .25 packs per day. He has been exposed to tobacco smoke. He has never used smokeless tobacco. He reports current alcohol use. He reports that he does not use drugs.  Allergies  Allergen Reactions   Amoxicillin Shortness Of Breath, Swelling and Other (See Comments)    Family History  Problem Relation Age of Onset   Heart attack Other    Prostate cancer Other    Hypertension Father    Hypertension Maternal Grandfather    Cancer Maternal Grandfather    Asthma Neg Hx     Prior to Admission medications   Medication Sig Start Date End Date Taking? Authorizing Provider  albuterol (VENTOLIN HFA) 108 (90 Base) MCG/ACT inhaler Inhale 2 puffs into the lungs every 4 (four) hours as needed for wheezing or shortness of breath. 08/13/22   Alfonse Spruce, MD  benztropine (COGENTIN) 2 MG tablet Take 1 tablet (2 mg total) by mouth 2 (two) times daily. 09/15/22   Shanna Cisco, NP  Budeson-Glycopyrrol-Formoterol (BREZTRI AEROSPHERE) 160-9-4.8 MCG/ACT AERO INHALE 2 PUFFS INTO THE LUNGS TWICE A DAY 09/15/22   Alfonse Spruce, MD  cetirizine (ZYRTEC) 10 MG tablet Take 1 tablet (10 mg total) by mouth daily. 08/13/22   Alfonse Spruce, MD  Cholecalciferol (VITAMIN D PO) Take 2,000  Units by mouth daily.    [provider]  cyclobenzaprine (FLEXERIL) 10 MG tablet Take 1 tablet (10 mg total) by mouth 2 (two) times daily as needed for muscle spasms. 10/21/21   Janell Quiet, PA-C  doxazosin (CARDURA) 1 MG tablet Take 1 tablet by mouth daily. 10/08/22 01/06/23  [provider]  hydrOXYzine (ATARAX) 25 MG tablet Take 25 mg by mouth every 8 (eight) hours as needed for anxiety. 10/07/22 01/05/23  [provider]  lisinopril (ZESTRIL) 20 MG tablet Take 20 mg by mouth  daily. 10/08/22 10/08/23  [provider]  metoprolol succinate (TOPROL-XL) 50 MG 24 hr tablet Take 100 mg by mouth 2 (two) times daily. 10/07/22 11/06/22  [provider]  Omega-3 Fatty Acids (FISH OIL TRIPLE STRENGTH) 1360 MG CAPS Take 1 capsule by mouth daily. 10/27/21   [provider]  Paliperidone Palmitate ER (INVEGA HAFYERA) 1092 MG/3.5ML SUSY Inject 1,092 mg into the muscle every 6 (six) months. 09/15/22   Shanna Cisco, NP  traZODone (DESYREL) 50 MG tablet Take 1 tablet (50 mg total) by mouth at bedtime for 7 days. 09/30/22 10/07/22  Pollyann Savoy, MD  WEGOVY 1 MG/0.5ML SOAJ Inject 1 mg into the skin once a week. 08/10/22   [provider]    Physical Exam:  Constitutional: NAD, calm, comfortable Respiratory: clear to auscultation bilaterally, no wheezing, no crackles. Normal respiratory effort. No accessory muscle use.  Cardiovascular: Tachycardic Abdomen: no tenderness, no masses palpated. No hepatosplenomegaly. Bowel sounds positive.  Neurologic: BLE weakness present though able to move everything, has BLE reflexes though diminished. Psychiatric: Flat affect  Data Reviewed:    Labs on Admission: I have personally reviewed following labs and imaging studies  CBC: Recent Labs  Lab 10/08/22 0023 10/09/22 0136 10/09/22 0302  WBC 13.9* 12.7*  --   NEUTROABS 9.3* 8.7*  --   HGB 14.5 13.8 13.9  HCT 41.1 40.0 41.0  MCV 85.4 88.1  --   PLT 334 286  --    Basic Metabolic Panel: Recent Labs  Lab 10/08/22 0023 10/09/22 0102 10/09/22 0249 10/09/22 0302  NA 139 138 139 140  K 4.1 5.1 4.0 4.3  CL 107 105 102  --   CO2 19* 19* 19*  --   GLUCOSE 106* 103* 100*  --   BUN 21* 20 20  --   CREATININE 0.97 1.21 1.30*  --   CALCIUM 10.1 10.1 10.5*  --    GFR: Estimated Creatinine Clearance: 132 mL/min (A) (by C-G formula based on SCr of 1.3 mg/dL (H)). Liver Function Tests: Recent Labs  Lab 10/08/22 0023 10/09/22 0102  AST 25 41  ALT 44  43  ALKPHOS 47 44  BILITOT 0.5 2.1*  PROT 7.6 7.1  ALBUMIN 4.9 4.1   No results for input(s): "LIPASE", "AMYLASE" in the last 168 hours. No results for input(s): "AMMONIA" in the last 168 hours. Coagulation Profile: Recent Labs  Lab 10/09/22 0344  INR 1.1   Cardiac Enzymes: Recent Labs  Lab 10/08/22 0157  CKTOTAL 488*    Urine analysis:    Component Value Date/Time   COLORURINE YELLOW 10/09/2022 0423   APPEARANCEUR HAZY (A) 10/09/2022 0423   LABSPEC >1.046 (H) 10/09/2022 0423   PHURINE 5.0 10/09/2022 0423   GLUCOSEU NEGATIVE 10/09/2022 0423   HGBUR NEGATIVE 10/09/2022 0423   BILIRUBINUR NEGATIVE 10/09/2022 0423   KETONESUR 5 (A) 10/09/2022 0423   PROTEINUR NEGATIVE 10/09/2022 0423   NITRITE NEGATIVE 10/09/2022 0423  LEUKOCYTESUR NEGATIVE 10/09/2022 0423    Radiological Exams on Admission: CT Angio Chest PE W and/or Wo Contrast  Result Date: 10/09/2022 CLINICAL DATA:  Chest pain and shortness of breath EXAM: CT ANGIOGRAPHY CHEST WITH CONTRAST TECHNIQUE: Multidetector CT imaging of the chest was performed using the standard protocol during bolus administration of intravenous contrast. Multiplanar CT image reconstructions and MIPs were obtained to evaluate the vascular anatomy. RADIATION DOSE REDUCTION: This exam was performed according to the departmental dose-optimization program which includes automated exposure control, adjustment of the mA and/or kV according to patient size and/or use of iterative reconstruction technique. CONTRAST:  75mL OMNIPAQUE IOHEXOL 350 MG/ML SOLN COMPARISON:  Chest x-ray from earlier in the same day. FINDINGS: Cardiovascular: Thoracic aorta and its branches are within normal limits. Pulmonary artery shows a normal branching pattern bilaterally. Filling defects are noted in the lower lobe pulmonary arterial branches right slightly greater than left. No evidence of right heart strain is noted. No coronary calcifications are noted. Mediastinum/Nodes:  Thoracic inlet is within normal limits. No hilar or mediastinal adenopathy is noted. The esophagus as visualized is within normal limits. Lungs/Pleura: Lungs are well aerated bilaterally. No focal infiltrate or sizable effusion is seen. No parenchymal nodules are noted. Upper Abdomen: Visualized upper abdomen shows fatty infiltration of the liver. Musculoskeletal: No chest wall abnormality. No acute or significant osseous findings. Review of the MIP images confirms the above findings. IMPRESSION: Bilateral lower lobe pulmonary emboli without evidence of right heart strain. No other focal abnormality is noted. Critical Value/emergent results were called by telephone at the time of interpretation on 10/09/2022 at 3:30 am to Dr. Tilden Fossa , who verbally acknowledged these results. Electronically Signed   By: Alcide Clever M.D.   On: 10/09/2022 03:32   DG Chest Portable 1 View  Result Date: 10/09/2022 CLINICAL DATA:  Shortness of breath. EXAM: PORTABLE CHEST 1 VIEW COMPARISON:  10/01/2022. FINDINGS: The heart size and mediastinal contours are within normal limits. Lung volumes are low. No consolidation, effusion, or pneumothorax. No acute osseous abnormality. IMPRESSION: No active disease. Electronically Signed   By: Thornell Sartorius M.D.   On: 10/09/2022 01:26    EKG: Independently reviewed.   Assessment and Plan: * Acute pulmonary embolism (HCC) Pt with BLL PEs.  Unclear if RHS or not: 1) No RHS on CT scan 2) Trop neg 3) pt with BPs ~100 systolic 4) s.tach to 130s, had this persistently during admit this past week to [facility] 5) S1Q3 but no T3 on EKG today  No O2 requirement.  Discussed with Dr. Merrily Pew with PCCM: he thinks that pt just dehydrated and we should give fluid for the soft BPs and tachycardia, doesn't feel pt needs thrombolytics at this time he states. Will order 1L IVF bolus Hold home BP meds, metoprolol, lisinopril Check 2d echo Heparin gtt is going Tele monitor BLE [place] for  DVTs  Suicidal ideations Currently here voluntarily. Management of psych issues / meds per psychiatry. Have put consult order to them into epic under new encounter. Will continue current psych meds that he was on over at [facility] Looks like they just have him on PRN meds for agitation  Lower extremity weakness My suspicion for an acute neurologic cord syndrome is low (somatization disorder seems more likely based on RN notes from [facility]); however, ill go ahead and get MR brain, C, T, and L spine to rule out any obvious cord / CNS lesions. Had reflexes on exam though hard to get.  GBS  possible but seems a little less likely. May want to get neuro consult as next step if MRI neg and weakness persists.  Essential hypertension, benign Holding all home BP meds given soft BPs in ED + pulmonary emboli.      Advance Care Planning:   Code Status: Full Code Default  Consults: Discussed with Dr. Merrily Pew  Family Communication: No family in room Discussed with Pt's mother.  Pt gave permission to discuss with her.  Severity of Illness: The appropriate patient status for this patient is INPATIENT. Inpatient status is judged to be reasonable and necessary in order to provide the required intensity of service to ensure the patient's safety. The patient's presenting symptoms, physical exam findings, and initial radiographic and laboratory data in the context of their chronic comorbidities is felt to place them at high risk for further clinical deterioration. Furthermore, it is not anticipated that the patient will be medically stable for discharge from the hospital within 2 midnights of admission.   * I certify that at the point of admission it is my clinical judgment that the patient will require inpatient hospital care spanning beyond 2 midnights from the point of admission due to high intensity of service, high risk for further deterioration and high frequency of surveillance required.*  Author: Hillary Bow., DO 10/09/2022 6:01 AM  For on call review [email or web address].

## 2022-10-09 NOTE — ED Notes (Signed)
Pt bladder scan resulted at 0 mls. Pt stated he had to urinate. Nurse tech stepped out to provide pt with privacy and when this NT returned the pt stated that he had used the urinal and handed nurse tech a empty urinal.

## 2022-10-10 ENCOUNTER — Other Ambulatory Visit (HOSPITAL_COMMUNITY): Payer: No Typology Code available for payment source

## 2022-10-10 ENCOUNTER — Encounter (HOSPITAL_COMMUNITY): Payer: Self-pay | Admitting: Internal Medicine

## 2022-10-10 ENCOUNTER — Inpatient Hospital Stay (HOSPITAL_COMMUNITY): Payer: No Typology Code available for payment source

## 2022-10-10 DIAGNOSIS — F251 Schizoaffective disorder, depressive type: Secondary | ICD-10-CM

## 2022-10-10 DIAGNOSIS — R45851 Suicidal ideations: Secondary | ICD-10-CM | POA: Diagnosis not present

## 2022-10-10 DIAGNOSIS — I2699 Other pulmonary embolism without acute cor pulmonale: Secondary | ICD-10-CM

## 2022-10-10 DIAGNOSIS — I2609 Other pulmonary embolism with acute cor pulmonale: Secondary | ICD-10-CM | POA: Diagnosis not present

## 2022-10-10 LAB — ECHOCARDIOGRAM COMPLETE
AR max vel: 2.6 cm2
AV Area VTI: 2.65 cm2
AV Area mean vel: 2.52 cm2
AV Mean grad: 2.2 mmHg
AV Peak grad: 4.2 mmHg
Ao pk vel: 1.02 m/s
Area-P 1/2: 8.72 cm2
Height: 73 in
S' Lateral: 3.2 cm
Weight: 5099.2 oz

## 2022-10-10 LAB — BASIC METABOLIC PANEL
Anion gap: 9 (ref 5–15)
BUN: 11 mg/dL (ref 6–20)
CO2: 21 mmol/L — ABNORMAL LOW (ref 22–32)
Calcium: 8.4 mg/dL — ABNORMAL LOW (ref 8.9–10.3)
Chloride: 110 mmol/L (ref 98–111)
Creatinine, Ser: 0.8 mg/dL (ref 0.61–1.24)
GFR, Estimated: >60 mL/min (ref >60)
Glucose, Bld: 92 mg/dL (ref 70–99)
Potassium: 3.8 mmol/L (ref 3.5–5.1)
Sodium: 140 mmol/L (ref 135–145)

## 2022-10-10 LAB — CBC
HCT: 35.4 % — ABNORMAL LOW (ref 39.0–52.0)
Hemoglobin: 11.9 g/dL — ABNORMAL LOW (ref 13.0–17.0)
MCH: 29.2 pg (ref 26.0–34.0)
MCHC: 33.6 g/dL (ref 30.0–36.0)
MCV: 86.8 fL (ref 80.0–100.0)
Platelets: 242 10*3/uL (ref 150–400)
RBC: 4.08 MIL/uL — ABNORMAL LOW (ref 4.22–5.81)
RDW: 12.4 % (ref 11.5–15.5)
WBC: 9.3 10*3/uL (ref 4.0–10.5)
nRBC: 0 % (ref 0.0–0.2)

## 2022-10-10 LAB — HEPARIN LEVEL (UNFRACTIONATED)
Heparin Unfractionated: 0.42 IU/mL (ref 0.30–0.70)
Heparin Unfractionated: 0.4 IU/mL (ref 0.30–0.70)

## 2022-10-10 MED ORDER — SODIUM CHLORIDE 0.9 % IV BOLUS
1000.0000 mL | Freq: Once | INTRAVENOUS | Status: AC
  Administered 2022-10-10: 1000 mL via INTRAVENOUS

## 2022-10-10 MED ORDER — OLANZAPINE 5 MG PO TBDP: 5.0000 mg | ORAL_TABLET | Freq: Three times a day (TID) | ORAL | 0 refills | Status: DC | PRN

## 2022-10-10 MED ORDER — PROPRANOLOL HCL 20 MG PO TABS: 20.0000 mg | ORAL_TABLET | Freq: Once | ORAL | Status: DC

## 2022-10-10 MED ORDER — TRAZODONE HCL 100 MG PO TABS: 100.0000 mg | ORAL_TABLET | Freq: Every evening | ORAL | 0 refills | Status: DC | PRN

## 2022-10-10 MED ORDER — ALBUTEROL SULFATE (2.5 MG/3ML) 0.083% IN NEBU: 2.5000 mg | INHALATION_SOLUTION | Freq: Four times a day (QID) | RESPIRATORY_TRACT | Status: DC | PRN

## 2022-10-10 MED ORDER — APIXABAN 5 MG PO TABS: 5.0000 mg | ORAL_TABLET | Freq: Two times a day (BID) | ORAL | 2 refills | Status: DC

## 2022-10-10 MED ORDER — HYDROXYZINE HCL 25 MG PO TABS
25.0000 mg | ORAL_TABLET | Freq: Three times a day (TID) | ORAL | Status: DC
  Administered 2022-10-10: 25 mg via ORAL
  Filled 2022-10-10: qty 1

## 2022-10-10 MED ORDER — APIXABAN 5 MG PO TABS: 10.0000 mg | ORAL_TABLET | Freq: Two times a day (BID) | ORAL | 0 refills | Status: DC

## 2022-10-10 MED ORDER — PERFLUTREN LIPID MICROSPHERE
1.0000 mL | INTRAVENOUS | Status: DC | PRN
  Administered 2022-10-10: 2 mL via INTRAVENOUS

## 2022-10-10 MED ORDER — HYDROXYZINE HCL 25 MG PO TABS: 25.0000 mg | ORAL_TABLET | Freq: Three times a day (TID) | ORAL | 0 refills | Status: DC | PRN

## 2022-10-10 MED ORDER — APIXABAN 5 MG PO TABS
10.0000 mg | ORAL_TABLET | Freq: Two times a day (BID) | ORAL | Status: DC
  Administered 2022-10-10: 10 mg via ORAL
  Filled 2022-10-10: qty 2

## 2022-10-10 MED ORDER — METOPROLOL SUCCINATE ER 100 MG PO TB24
100.0000 mg | ORAL_TABLET | Freq: Two times a day (BID) | ORAL | Status: DC
  Administered 2022-10-10: 100 mg via ORAL
  Filled 2022-10-10: qty 1

## 2022-10-10 MED ORDER — APIXABAN 5 MG PO TABS: 5.0000 mg | ORAL_TABLET | Freq: Two times a day (BID) | ORAL | Status: DC

## 2022-10-10 MED ORDER — DOXAZOSIN MESYLATE 1 MG PO TABS: 1.0000 mg | ORAL_TABLET | Freq: Every day | ORAL | Status: DC

## 2022-10-10 MED ORDER — PROPRANOLOL HCL 40 MG PO TABS: 40.0000 mg | ORAL_TABLET | Freq: Two times a day (BID) | ORAL | Status: DC

## 2022-10-10 NOTE — Progress Notes (Signed)
   10/09/22 2300  Assess: MEWS Score  Temp 98.3 F (36.8 C)  BP 128/64  MAP (mmHg) 79  Pulse Rate (!) 123  Resp 16  Level of Consciousness Alert  SpO2 97 %  O2 Device Room Air  Patient Activity (if Appropriate) In bed  Assess: MEWS Score  MEWS Temp 0  MEWS Systolic 0  MEWS Pulse 2  MEWS RR 0  MEWS LOC 0  MEWS Score 2  MEWS Score Color Yellow  Assess: if the MEWS score is Yellow or Red  Were vital signs taken at a resting state? Yes  Focused Assessment No change from prior assessment (pt has been RED/yellow in ED)  Does the patient meet 2 or more of the SIRS criteria? No  MEWS guidelines implemented  Yes, yellow  Treat  MEWS Interventions Considered administering scheduled or prn medications/treatments as ordered  Take Vital Signs  Increase Vital Sign Frequency  Yellow: Q2hr x1, continue Q4hrs until patient remains green for 12hrs  Escalate  MEWS: Escalate Yellow: Discuss with charge nurse and consider notifying provider and/or RRT  Provider Notification  Provider Name/Title Dr Margo Aye  Date Provider Notified 10/10/22  Time Provider Notified 0130  Method of Notification  (secure chat)  Notification Reason Other (Comment) (HR 120's)  Provider response No new orders  Date of Provider Response 10/10/22  Time of Provider Response 0130  Assess: SIRS CRITERIA  SIRS Temperature  0  SIRS Pulse 1  SIRS Respirations  0  SIRS WBC 0  SIRS Score Sum  1

## 2022-10-10 NOTE — Progress Notes (Signed)
ANTICOAGULATION CONSULT NOTE - Follow Up Consult  Pharmacy Consult for heparin Indication: pulmonary embolus  Labs: Recent Labs    10/08/22 0157 10/09/22 0102 10/09/22 0136 10/09/22 0249 10/09/22 0302 10/09/22 0344 10/09/22 1108 10/09/22 1923 10/10/22 0225  HGB  --   --  13.8  --  13.9  --  12.8*  --  11.9*  HCT  --   --  40.0  --  41.0  --  37.6*  --  35.4*  PLT  --   --  286  --   --   --  248  --  242  LABPROT  --   --   --   --   --  14.0  --   --   --   INR  --   --   --   --   --  1.1  --   --   --   HEPARINUNFRC  --   --   --   --   --   --  0.28* 0.20* 0.42  CREATININE  --  1.21  --  1.30*  --   --   --   --  0.80  CKTOTAL 488*  --   --   --   --   --   --   --   --   TROPONINIHS  --  6  --  7  --   --   --   --   --     Assessment/Plan:  22yo male therapeutic on heparin after rate change. Will continue infusion at current rate of 2300 units/hr and confirm stable with additional level.  Vernard Gambles, PharmD, BCPS 10/10/2022 5:37 AM

## 2022-10-10 NOTE — Plan of Care (Signed)

## 2022-10-10 NOTE — Progress Notes (Signed)
Echocardiogram 2D Echocardiogram has been performed.  Nathaniel [name] 10/10/2022, 11:36 AM

## 2022-10-10 NOTE — Consult Note (Signed)
[facility] Face-to-Face Psychiatry Consult   Reason for Consult:''[facility] inpatient patient being admitted for PEs, needs ongoing management of psychiatric illness during stay on medical unit.'' Referring Physician:  Jetty Duhamel, MD Patient Identification: Joriel Dichter MRN:  [id number] Principal Diagnosis: Acute pulmonary embolism (HCC) Diagnosis:  Principal Problem:   Acute pulmonary embolism (HCC) Active Problems:   Essential hypertension, benign   Suicidal ideations   Lower extremity weakness   Total Time spent with patient: 1 hour  Subjective:   Scotte Gildea is a 22 y.o. male patient who was transferred to medical unit from [facility] inpatient for treatment of Pulmonary embolism.  HPI:  22 y.o. year-old male with a history of sleep apnea, obesity, Asthma, Insomnia, Schizoaffective disorder-depressive type and Anxiety who admitted to [facility] due to suicidal thoughts with plant to harm himself with Kitchen knife. Patient reports being stressed out due to inability of providers to find a right medication combination to address his mental illness. He has had a trial of some medications without success. However, he states that he was placed on every 6 months injection of Invega HAFYERA (last dose 08/27/22) which seems to be helping but still struggling with insomnia secondary to sleep apnea. Chart review revealed that patient was transferred to the medical unit yesterday for the treatment of pulmonary embolism. He reports doing better medically and mentally today. He reports feeling a little anxious, apprehensions about the PE but denies depression, psychosis, delusions, suicidal/homicidal ideations, intent or plan. He is requesting for adjustment to his medications, will like to take Hydroxyzine routinely for anxiety/insomnia and agitation and request for cogentin to be discontinue due to constipation.  Past Psychiatric History: as above  Risk to Self:  denies Risk to Others:  denies Prior Inpatient Therapy:   yes Prior Outpatient Therapy:  yes  Past Medical History:  Past Medical History:  Diagnosis Date   Allergic rhinitis    Chronic tonsillar hypertrophy    ENT did tonsillectomy 2020   Elevated blood pressure reading without diagnosis of hypertension    IFG (impaired fasting glucose) 12/2017   Gluc 107, HbA1c 5.5%   Obesity, Class II, BMI 35-39.9    OSA (obstructive sleep apnea) 02/2018   Sleep study recommended by Dr. Ivory Broad considering it.   Severe persistent asthma    Dr. Sharyn Lull    Past Surgical History:  Procedure Laterality Date   TONSILLECTOMY  06/03/2018   Family History:  Family History  Problem Relation Age of Onset   Heart attack Other    Prostate cancer Other    Hypertension Father    Hypertension Maternal Grandfather    Cancer Maternal Grandfather    Asthma Neg Hx    Family Psychiatric  History:  Social History:  Social History   Substance and Sexual Activity  Alcohol Use Yes   Comment: Rare     Social History   Substance and Sexual Activity  Drug Use No    Social History   Socioeconomic History   Marital status: Single    Spouse name: Not on file   Number of children: Not on file   Years of education: Not on file   Highest education level: Not on file  Occupational History   Not on file  Tobacco Use   Smoking status: Every Day    Packs/day: .25    Types: Cigarettes    Passive exposure: Past   Smokeless tobacco: Never  Vaping Use   Vaping Use: Never used  Substance and Sexual Activity  Alcohol use: Yes    Comment: Rare   Drug use: No   Sexual activity: Yes  Other Topics Concern   Not on file  Social History Narrative   Single, lives with mom in [place].   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   No T/A/Ds.   Social Determinants of Health   Financial Resource Strain: Low Risk  (11/10/2021)   Overall Financial Resource Strain (CARDIA)    Difficulty of Paying Living Expenses: Not hard at all   Food Insecurity: No Food Insecurity (10/09/2022)   Hunger Vital Sign    Worried About Running Out of Food in the Last Year: Never true    Ran Out of Food in the Last Year: Never true  Transportation Needs: No Transportation Needs (10/09/2022)   PRAPARE - Administrator, Civil Service (Medical): No    Lack of Transportation (Non-Medical): No  Physical Activity: Sufficiently Active (11/10/2021)   Exercise Vital Sign    Days of Exercise per Week: 6 days    Minutes of Exercise per Session: 30 min  Stress: Stress Concern Present (11/10/2021)   [employer] of Occupational Health - Occupational Stress Questionnaire    Feeling of Stress : Very much  Social Connections: Socially Isolated (11/10/2021)   Social Connection and Isolation Panel [NHANES]    Frequency of Communication with Friends and Family: More than three times a week    Frequency of Social Gatherings with Friends and Family: More than three times a week    Attends Religious Services: Never    Database administrator or Organizations: No    Attends Banker Meetings: Never    Marital Status: Never married   Additional Social History:    Allergies:   Allergies  Allergen Reactions   Amoxicillin Shortness Of Breath, Swelling and Other (See Comments)    Labs:  Results for orders placed or performed during the hospital encounter of 10/09/22 (from the past 48 hour(s))  Heparin level (unfractionated)     Status: None   Collection Time: 10/10/22  9:31 AM  Result Value Ref Range   Heparin Unfractionated 0.40 0.30 - 0.70 IU/mL    Comment: (NOTE) The clinical reportable range upper limit is being lowered to >1.10 to align with the FDA approved guidance for the current laboratory assay.  If heparin results are below expected values, and patient dosage has  been confirmed, suggest follow up testing of antithrombin III levels. Performed at [facility], [address] [place], [place] [address]      Current Facility-Administered Medications  Medication Dose Route Frequency Provider Last Rate Last Admin   0.9 %  sodium chloride infusion   Intravenous Continuous Lonia Blood, MD 150 mL/hr at 10/10/22 1308 Infusion Verify at 10/10/22 6578   acetaminophen (TYLENOL) tablet 650 mg  650 mg Oral Q6H PRN Hillary Bow, DO       albuterol (PROVENTIL) (2.5 MG/3ML) 0.083% nebulizer solution 2.5 mg  2.5 mg Inhalation Q6H PRN Lonia Blood, MD       alum & mag hydroxide-simeth (MAALOX/MYLANTA) 200-200-20 MG/5ML suspension 30 mL  30 mL Oral Q4H PRN Lonia Blood, MD       doxazosin (CARDURA) tablet 1 mg  1 mg Oral Daily Jetty Duhamel T, MD   1 mg at 10/10/22 0930   heparin ADULT infusion 100 units/mL (25000 units/279mL)  2,300 Units/hr Intravenous Continuous Lonia Blood, MD 23  mL/hr at 10/10/22 1035 2,300 Units/hr at 10/10/22 1035   hydrOXYzine (ATARAX) tablet 25 mg  25 mg Oral TID Thedore Mins, MD       loratadine (CLARITIN) tablet 10 mg  10 mg Oral Daily Lonia Blood, MD   10 mg at 10/10/22 0930   magnesium hydroxide (MILK OF MAGNESIA) suspension 30 mL  30 mL Oral Daily PRN Lonia Blood, MD       metoprolol succinate (TOPROL-XL) 24 hr tablet 100 mg  100 mg Oral BID Lonia Blood, MD   100 mg at 10/10/22 1147   OLANZapine zydis (ZYPREXA) disintegrating tablet 5 mg  5 mg Oral Q8H PRN Thedore Mins, MD       ondansetron (ZOFRAN) tablet 4 mg  4 mg Oral Q6H PRN Hillary Bow, DO       Or   ondansetron [facility]) injection 4 mg  4 mg Intravenous Q6H PRN Hillary Bow, DO       perflutren lipid microspheres (DEFINITY) IV suspension  1-10 mL Intravenous PRN Lonia Blood, MD   2 mL at 10/10/22 1135   traZODone (DESYREL) tablet 100 mg  100 mg Oral QHS PRN Hillary Bow, DO   100 mg at 10/10/22 0027    Musculoskeletal: Strength & Muscle Tone: within normal limits Gait & Station: unable to stand Patient leans: N/A   Psychiatric Specialty  Exam:  Presentation  General Appearance:  Appropriate for Environment  Eye Contact: Good  Speech: Clear and Coherent  Speech Volume: Decreased  Handedness: Right   Mood and Affect  Mood: Dysphoric  Affect: Appropriate   Thought Process  Thought Processes: Coherent; Linear  Descriptions of Associations:Intact  Orientation:Full (Time, Place and Person)  Thought Content:Logical  History of Schizophrenia/Schizoaffective disorder:Yes  Duration of Psychotic Symptoms:Greater than six months  Hallucinations:Hallucinations: None  Ideas of Reference:None  Suicidal Thoughts:Suicidal Thoughts: No  Homicidal Thoughts:Homicidal Thoughts: No   Sensorium  Memory: Immediate Good; Recent Good; Remote Fair  Judgment: Fair  Insight: Fair   Executive Functions  Concentration: Good  Attention Span: Good  Recall: Good  Fund of Knowledge: Good  Language: Good   Psychomotor Activity  Psychomotor Activity: Psychomotor Activity: Decreased   Assets  Assets: Desire for Improvement; Communication Skills; Social Support   Sleep  Sleep: Sleep: Poor   Physical Exam: Physical Exam Review of Systems  Psychiatric/Behavioral:  Negative for depression, hallucinations, memory loss and suicidal ideas. The patient has insomnia. The patient is not nervous/anxious.    Blood pressure (!) 133/58, pulse (!) 109, temperature 98.9 F (37.2 C), temperature source Oral, resp. rate 20, height 6\' 1"  (1.854 m), weight (!) 144.6 kg, SpO2 98 %. Body mass index is 42.05 kg/m.  Treatment Plan Summary: 22 year old male with history of mental illness who was recently admitted to [facility] due to suicidal thoughts with plan. However, he was transferred to the medical unit in [place] for the treatment of Pulmonary embolism. Today, he is alert, awake, calm, cooperative, denies psychosis, delusions, self harming thoughts but reports ongoing insomnia. Based on my assessment today  and patient's reports, he no longer meet criteria for inpatient psychiatric admission.  Plan/Recommendations: -D/C Cogentin per patient's request-causes constipation -Change Hydroxyzine 25 mg TID prn to 25 mg TID for anxiety/insomnia and EPS prevention -Continue Olanzapine Odt 5 mg q8h prn for agitation -Continue Trazodone 100 mg at bedtime PRN for insomnia -Patient will continue Invega HAFYERA 1092 mg every 6 month IM for Schizoaffective disorder with  his outpatient psychiatrist(last dose was 08/27/22)  Disposition: No evidence of imminent risk to self or others at present.   Patient does not meet criteria for psychiatric inpatient admission. Supportive therapy provided about ongoing stressors. Psychiatric consult service signing out. Re-consult as needed   Thedore Mins, MD 10/10/2022 12:47 PM

## 2022-10-10 NOTE — Progress Notes (Addendum)
ANTICOAGULATION CONSULT NOTE  Pharmacy Consult for Heparin > Apixaban Indication: pulmonary embolus  Allergies  Allergen Reactions   Amoxicillin Shortness Of Breath, Swelling and Other (See Comments)    Patient Measurements: Height: 6\' 1"  (185.4 cm) Weight: (!) 144.6 kg (318 lb 11.2 oz) IBW/kg (Calculated) : 79.9 Heparin Dosing Weight: 112 Kg  Vital Signs: Temp: 98.5 F (36.9 C) (06/08 0300) Temp Source: Oral (06/08 0300) BP: 137/68 (06/08 1019) Pulse Rate: 107 (06/08 1019)  Labs: Recent Labs    10/08/22 0157 10/09/22 0102 10/09/22 0136 10/09/22 0249 10/09/22 0302 10/09/22 0302 10/09/22 0344 10/09/22 1108 10/09/22 1923 10/10/22 0225 10/10/22 0931  HGB  --   --  13.8  --  13.9  --   --  12.8*  --  11.9*  --   HCT  --   --  40.0  --  41.0  --   --  37.6*  --  35.4*  --   PLT  --   --  286  --   --   --   --  248  --  242  --   LABPROT  --   --   --   --   --   --  14.0  --   --   --   --   INR  --   --   --   --   --   --  1.1  --   --   --   --   HEPARINUNFRC  --   --   --   --   --    < >  --  0.28* 0.20* 0.42 0.40  CREATININE  --  1.21  --  1.30*  --   --   --   --   --  0.80  --   CKTOTAL 488*  --   --   --   --   --   --   --   --   --   --   TROPONINIHS  --  6  --  7  --   --   --   --   --   --   --    < > = values in this interval not displayed.     Estimated Creatinine Clearance: 213.1 mL/min (by C-G formula based on SCr of 0.8 mg/dL).   Medical History: Past Medical History:  Diagnosis Date   Allergic rhinitis    Chronic tonsillar hypertrophy    ENT did tonsillectomy 2020   Elevated blood pressure reading without diagnosis of hypertension    IFG (impaired fasting glucose) 12/2017   Gluc 107, HbA1c 5.5%   Obesity, Class II, BMI 35-39.9    OSA (obstructive sleep apnea) 02/2018   Sleep study recommended by Dr. Ivory Broad considering it.   Severe persistent asthma    Dr. Sharyn Lull   Assessment:   Nathaniel [name] with confirmed bilateral PE. No oral  anticoagulation reported prior to admission. CBC WNL. Pharmacy consulted to dose heparin infusion.   Heparin level therapeutic at 0.40 while on heparin 2300u/hr. Hgb 11.9, stable, plt wnl. No concerns noted for bleeding.   Goal of Therapy:  Heparin level 0.3-0.7 units/ml Monitor platelets by anticoagulation protocol: Yes   Plan:  Continue 2300 units/hr Check anti-Xa level daily while on heparin Continue to monitor H&H and platelets  Rennis Petty, PharmD PGY1 Pharmacy Resident 10/10/2022 11:31 AM   ------------------------------------------------------------------------------------------------------------------------------------- Addendum Pharmacy consulted to transition from heparin to Apixaban  with plans to discharge home soon.   Plan: Discontinue heparin Start apixaban 10mg  BID x7d followed by 5mg   for PE treatment Monitor CBC and s/s of bleeding regularly  Rennis Petty, PharmD PGY1 Pharmacy Resident 10/10/2022 2:49 PM

## 2022-10-10 NOTE — Discharge Summary (Signed)
DISCHARGE SUMMARY  Nathaniel [name]  MR#: [id number]  DOB:05-14-2000  Date of Admission: 10/09/2022 Date of Discharge: 10/10/2022  Attending Physician:[name], MD  Patient's WUJ:[id number], [name], MD  Consults: Psychiatry   Disposition: D/C home   Follow-up Appts:  Follow-up Information     [name], MD Follow up.   Specialty: Family Medicine Contact information: [address] [place] [address] [phone]                 Tests Needing Follow-up: -assess HR with recent sinus tachycardia  -after 6 months of uninterrupted anticoagulation tx the pt should be given a DOAC holiday and then labs obtained to rule out a hypercoagulable disorder   Discharge Diagnoses: Acute bilateral pulmonary emboli Persistent sinus tachycardia  Suicidal ideation -schizoaffective disorder with depression/anxiety Reported subacute bilateral lower extremity weakness Obesity - Body mass index is 42.05 kg/m.  Initial presentation: 22 year old with a complex psychiatric history who was admitted to [facility] 5/30-10/07/2022 with persistent tachycardia and concern for serotonin syndrome. He was started on metoprolol at that time. He was transferred to the behavioral health hospital 6/6 where records indicate he very frequently reported different somatic complaints to include bilateral lower extremity paralysis, shortness of breath, and chest pain. He was ultimately transferred to the ER 6/7 for an evaluation where CTa of the chest demonstrated bilateral lower lobe pulmonary emboli without evidence of right heart strain. Troponin was normal. On review of systems the patient's mother also reported intermittent urinary incontinence with generalized weakness for couple of weeks.   Hospital Course:  Acute bilateral pulmonary emboli Clinically stable - troponin negative - blood pressure preserved -TTE noted no evidence of signif RV strain - venous  duplex negative for DVT B LE - heparin drip transitioned to apixaban -only risk factor appears to be obesity, though an active lifestyle could certainly be contributing as well -patient should receive 6 months of uninterrupted anticoagulation at which time he can be given an anticoagulation holiday and undergo a workup to rule out a hypercoagulable state  Persistent sinus tachycardia  Sinus tachycardia with heart rates anywhere from 100-1 30 was noted consistently throughout his hospital stay -fortunately TTE noted preserved LV systolic function -suspect this is a combination of anxiety as well as beta-blocker withdrawal as well as physiologic response to PE -patient was aggressively hydrated during his hospital stay -heart rate should be assessed in follow-up to assure that his tachycardia has improved and if not further evaluation will be indicated -TSH 10/01/2022 was normal  Suicidal ideation -schizoaffective disorder with depression/anxiety This is reportedly the diagnosis under which the patient was admitted to behavioral health - Psych was consulted during this hospital stay and felt the patient was no longer a risk to himself and was cleared for discharge home from a psychiatric standpoint -medication management was adjusted at the recommendation of psychiatry team  Reported bilateral lower extremity weakness Somatization disorder versus actual neurologic issue - MRI head unrevealing -MRI of spine ordered but refused by patient - CK modestly elevated at presentation but not severely so -patient was offered the opportunity to stay in the hospital and undergo physical and Occupational Therapy evaluation, as well as MRI of the spine but he persisted and his refusal to undergo MRI -he stated he was able to ambulate on his own -physical exam revealed 4+/5 strength bilateral lower extremity with no Babinski -patient and his mother were advised that should his weakness progressed that  he will need to return  for a full evaluation and that this will necessarily include MRI imaging of his spine   Obesity - Body mass index is 42.05 kg/m.    Allergies as of 10/10/2022       Reactions   Amoxicillin Shortness Of Breath, Swelling, Other (See Comments)        Medication List     STOP taking these medications    benztropine 2 MG tablet Commonly known as: COGENTIN   Ingrezza 40 MG capsule Generic drug: valbenazine       TAKE these medications    albuterol 108 (90 Base) MCG/ACT inhaler Commonly known as: VENTOLIN HFA Inhale 2 puffs into the lungs every 4 (four) hours as needed for wheezing or shortness of breath. What changed: how much to take   apixaban 5 MG Tabs tablet Commonly known as: ELIQUIS Take 2 tablets (10 mg total) by mouth 2 (two) times daily for 7 days.   apixaban 5 MG Tabs tablet Commonly known as: ELIQUIS Take 1 tablet (5 mg total) by mouth 2 (two) times daily. Start taking on: October 17, 2022   Breztri Aerosphere 160-9-4.8 MCG/ACT Aero Generic drug: Budeson-Glycopyrrol-Formoterol INHALE 2 PUFFS INTO THE LUNGS TWICE A DAY What changed:  how much to take how to take this when to take this additional instructions   cetirizine 10 MG tablet Commonly known as: ZYRTEC Take 1 tablet (10 mg total) by mouth daily.   doxazosin 1 MG tablet Commonly known as: CARDURA Take 1 mg by mouth daily.   hydrOXYzine 25 MG tablet Commonly known as: ATARAX Take 1-2 tablets (25-50 mg total) by mouth every 8 (eight) hours as needed for anxiety. What changed: how much to take   Invega Hafyera 1092 MG/3.5ML Susy Generic drug: Paliperidone Palmitate ER Inject 1,092 mg into the muscle every 6 (six) months.   lisinopril 20 MG tablet Commonly known as: ZESTRIL Take 20 mg by mouth daily.   metoprolol succinate 50 MG 24 hr tablet Commonly known as: TOPROL-XL Take 100 mg by mouth 2 (two) times daily.   OLANZapine zydis 5 MG disintegrating tablet Commonly known as:  ZYPREXA Take 1 tablet (5 mg total) by mouth every 8 (eight) hours as needed (agitation).   Omega-3 1400 MG Caps Take 1,400 mg by mouth daily.   traZODone 100 MG tablet Commonly known as: DESYREL Take 1 tablet (100 mg total) by mouth at bedtime as needed for sleep. What changed:  medication strength how much to take when to take this reasons to take this   Vitamin D-3 125 MCG (5000 UT) Tabs Take 5,000 Units by mouth daily.   Wegovy 1 MG/0.5ML Soaj Generic drug: Semaglutide-Weight Management Inject 1 mg into the skin once a week.        Day of Discharge BP (!) 133/58 (BP Location: Left Arm)   Pulse (!) 109   Temp 98.9 F (37.2 C) (Oral)   Resp 20   Ht 6\' 1"  (1.854 m)   Wt (!) 144.6 kg   SpO2 98%   BMI 42.05 kg/m   Physical Exam: General: No acute respiratory distress Lungs: Clear to auscultation bilaterally without wheezes or crackles Cardiovascular: Regular rate and rhythm without murmur gallop or rub normal S1 and S2 Abdomen: Nontender, nondistended, soft, bowel sounds positive, no rebound, no ascites, no appreciable mass Extremities: No significant cyanosis, clubbing, or edema bilateral lower extremities  Basic Metabolic Panel: Recent Labs  Lab 10/08/22 0023 10/09/22 0102 10/09/22 0249 10/09/22 0302  10/10/22 0225  NA 139 138 139 140 140  K 4.1 5.1 4.0 4.3 3.8  CL 107 105 102  --  110  CO2 19* 19* 19*  --  21*  GLUCOSE 106* 103* 100*  --  92  BUN 21* 20 20  --  11  CREATININE 0.97 1.21 1.30*  --  0.80  CALCIUM 10.1 10.1 10.5*  --  8.4*   CBC: Recent Labs  Lab 10/08/22 0023 10/09/22 0136 10/09/22 0302 10/09/22 1108 10/10/22 0225  WBC 13.9* 12.7*  --  12.0* 9.3  NEUTROABS 9.3* 8.7*  --   --   --   HGB 14.5 13.8 13.9 12.8* 11.9*  HCT 41.1 40.0 41.0 37.6* 35.4*  MCV 85.4 88.1  --  88.1 86.8  PLT 334 286  --  248 242    Time spent in discharge (includes decision making & examination of pt): 35 minutes  10/10/2022, 3:12 PM   Lonia Blood, MD [facility] Office  [phone]

## 2022-10-22 ENCOUNTER — Encounter (HOSPITAL_COMMUNITY): Payer: Self-pay | Admitting: Psychiatry

## 2022-10-22 ENCOUNTER — Telehealth (HOSPITAL_COMMUNITY): Payer: No Typology Code available for payment source | Admitting: Psychiatry

## 2022-10-22 DIAGNOSIS — F411 Generalized anxiety disorder: Secondary | ICD-10-CM

## 2022-10-22 DIAGNOSIS — F250 Schizoaffective disorder, bipolar type: Secondary | ICD-10-CM

## 2022-10-22 MED ORDER — HYDROXYZINE HCL 25 MG PO TABS: 25.0000 mg | ORAL_TABLET | Freq: Three times a day (TID) | ORAL | 3 refills | Status: DC

## 2022-10-22 MED ORDER — TRAZODONE HCL 100 MG PO TABS
100.0000 mg | ORAL_TABLET | Freq: Every evening | ORAL | 3 refills | Status: DC | PRN
Start: 2022-10-22 — End: 2022-11-18

## 2022-10-22 MED ORDER — INVEGA HAFYERA 1092 MG/3.5ML IM SUSY: 1092.0000 mg | PREFILLED_SYRINGE | INTRAMUSCULAR | 11 refills | Status: DC

## 2022-10-22 MED ORDER — OLANZAPINE 5 MG PO TBDP: 5.0000 mg | ORAL_TABLET | Freq: Three times a day (TID) | ORAL | 3 refills | Status: DC | PRN

## 2022-10-22 NOTE — Progress Notes (Signed)
BH MD/PA/NP OP Progress Note Virtual Visit via Video Note  I connected with Nathaniel [name] on 10/22/22 at  2:00 PM EDT by a video enabled telemedicine application and verified that I am speaking with the correct person using two identifiers.  Location: Patient: Home Provider: Clinic   I discussed the limitations of evaluation and management by telemedicine and the availability of in person appointments. The patient expressed understanding and agreed to proceed.  I provided 30 minutes of non-face-to-face time during this encounter.     10/22/2022 3:12 PM Tyris Salsedo  MRN:  [id number]  Chief Complaint: "I have a pulmonary embolism"  HPI:  22 year old male seen today for follow-up psychiatric evaluation. He has a psychiatric history of substance use, tobacco dependence, schizoaffective disorder bipolar type, and anxiety.  He is was admitted at [facility] where he was seen for anxiety and insomnia on 10/01/2022 through 10/07/2022.  His medications were adjusted and he is now prescribed  Invega Hafyera 1092 every 6 months, Zyprexa 5 mg nightly, trazodone 100 mg nightly, and hydroxyzine 25-50 mg times daily as needed. He informed Clinical research associate that his medications are effective in managing his psychiatric conditions.  Patient's BuSpar and Ingrezza were discontinued during his hospitalization.  At this time it was not restarted.  Today he is well-groomed, pleasant, cooperative, engaged in conversation, maintaining eye contact.  He informed Clinical research associate that recently he was diagnosed with pulmonary embolism.  He notes that he feels more fatigued.  Patient informed Clinical research associate that he believed Hinda Glatter was causing him to be fatigued but now realizes that it potentially was the pulmonary embolism.  Since his hospitalization he reports that he is sleeping better and informed writer that his anxiety and depression are well-managed.  Today provider today GAD-7 and patient scored a 5, at his last visit he scored a 7.  Provider also  conducted PHQ-9 and patient scored a 4, at his last visit he scored an 8. Patient endorses adequate sleep and reduced appetite. He is current prescribed Wegovy and notes that he has lost 18 pound since his last visit.  No medication changes made today. Patient agreeable to continue medication as prescribed. No other concerns noted at this time.   Visit Diagnosis:    ICD-10-CM   1. Anxiety state  F41.1 hydrOXYzine (ATARAX) 25 MG tablet    2. Schizoaffective disorder, bipolar type (HCC)  F25.0 OLANZapine zydis (ZYPREXA) 5 MG disintegrating tablet    Paliperidone Palmitate ER (INVEGA HAFYERA) 1092 MG/3.5ML SUSY    traZODone (DESYREL) 100 MG tablet      Past Psychiatric History:  substance use, tobacco dependence, schizoaffective disorder bipolar type, and anxiety  Past Medical History:  Past Medical History:  Diagnosis Date   Allergic rhinitis    Chronic tonsillar hypertrophy    ENT did tonsillectomy 2020   Elevated blood pressure reading without diagnosis of hypertension    IFG (impaired fasting glucose) 12/2017   Gluc 107, HbA1c 5.5%   Obesity, Class II, BMI 35-39.9    OSA (obstructive sleep apnea) 02/2018   Sleep study recommended by Dr. Ivory Broad considering it.   Severe persistent asthma    Dr. Sharyn Lull    Past Surgical History:  Procedure Laterality Date   TONSILLECTOMY  06/03/2018    Family Psychiatric History:  Denies  Family History:  Family History  Problem Relation Age of Onset   Heart attack Other    Prostate cancer Other    Hypertension Father    Hypertension Maternal Grandfather    Cancer  Maternal Grandfather    Asthma Neg Hx     Social History:  Social History   Socioeconomic History   Marital status: Single    Spouse name: Not on file   Number of children: Not on file   Years of education: Not on file   Highest education level: Not on file  Occupational History   Not on file  Tobacco Use   Smoking status: Every Day    Packs/day: .25    Types:  Cigarettes    Passive exposure: Past   Smokeless tobacco: Never  Vaping Use   Vaping Use: Never used  Substance and Sexual Activity   Alcohol use: Yes    Comment: Rare   Drug use: No   Sexual activity: Yes  Other Topics Concern   Not on file  Social History Narrative   Single, lives with mom in [place].   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   No T/A/Ds.   Social Determinants of Health   Financial Resource Strain: Low Risk  (11/10/2021)   Overall Financial Resource Strain (CARDIA)    Difficulty of Paying Living Expenses: Not hard at all  Food Insecurity: No Food Insecurity (10/09/2022)   Hunger Vital Sign    Worried About Running Out of Food in the Last Year: Never true    Ran Out of Food in the Last Year: Never true  Transportation Needs: No Transportation Needs (10/09/2022)   PRAPARE - Administrator, Civil Service (Medical): No    Lack of Transportation (Non-Medical): No  Physical Activity: Sufficiently Active (11/10/2021)   Exercise Vital Sign    Days of Exercise per Week: 6 days    Minutes of Exercise per Session: 30 min  Stress: Stress Concern Present (11/10/2021)   [employer] of Occupational Health - Occupational Stress Questionnaire    Feeling of Stress : Very much  Social Connections: Socially Isolated (11/10/2021)   Social Connection and Isolation Panel [NHANES]    Frequency of Communication with Friends and Family: More than three times a week    Frequency of Social Gatherings with Friends and Family: More than three times a week    Attends Religious Services: Never    Database administrator or Organizations: No    Attends Banker Meetings: Never    Marital Status: Never married    Allergies:  Allergies  Allergen Reactions   Amoxicillin Shortness Of Breath, Swelling and Other (See Comments)    Metabolic Disorder Labs: Lab Results  Component Value Date   HGBA1C 5.5 03/14/2020   MPG 111.15  03/14/2020   MPG 114 08/27/2014   No results found for: "PROLACTIN" Lab Results  Component Value Date   CHOL 180 03/14/2020   TRIG 136 03/14/2020   HDL 33 (L) 03/14/2020   CHOLHDL 5.5 03/14/2020   VLDL 27 03/14/2020   LDLCALC 120 (H) 03/14/2020   LDLCALC 92 02/28/2019   Lab Results  Component Value Date   TSH 2.519 03/15/2020   TSH 5.616 (H) 03/14/2020    Therapeutic Level Labs: No results found for: "LITHIUM" No results found for: "VALPROATE" No results found for: "CBMZ"  Current Medications: Current Outpatient Medications  Medication Sig Dispense Refill   albuterol (VENTOLIN HFA) 108 (90 Base) MCG/ACT inhaler Inhale 2 puffs into the lungs every 4 (four) hours as needed for wheezing or shortness of breath. (Patient taking differently: Inhale 1-2 puffs into the lungs every 4 (four) hours  as needed for wheezing or shortness of breath.) 18 each 1   apixaban (ELIQUIS) 5 MG TABS tablet Take 2 tablets (10 mg total) by mouth 2 (two) times daily for 7 days. 28 tablet 0   apixaban (ELIQUIS) 5 MG TABS tablet Take 1 tablet (5 mg total) by mouth 2 (two) times daily. 60 tablet 2   Budeson-Glycopyrrol-Formoterol (BREZTRI AEROSPHERE) 160-9-4.8 MCG/ACT AERO INHALE 2 PUFFS INTO THE LUNGS TWICE A DAY (Patient taking differently: Inhale 2 puffs into the lungs daily.) 10.7 g 5   cetirizine (ZYRTEC) 10 MG tablet Take 1 tablet (10 mg total) by mouth daily. 90 tablet 1   Cholecalciferol (VITAMIN D-3) 125 MCG (5000 UT) TABS Take 5,000 Units by mouth daily.     doxazosin (CARDURA) 1 MG tablet Take 1 mg by mouth daily.     hydrOXYzine (ATARAX) 25 MG tablet Take 1 tablet (25 mg total) by mouth 3 (three) times daily. 90 tablet 3   lisinopril (ZESTRIL) 20 MG tablet Take 20 mg by mouth daily.     metoprolol succinate (TOPROL-XL) 50 MG 24 hr tablet Take 100 mg by mouth 2 (two) times daily.     OLANZapine zydis (ZYPREXA) 5 MG disintegrating tablet Take 1 tablet (5 mg total) by mouth every 8 (eight) hours as  needed (agitation). 30 tablet 3   Omega-3 1400 MG CAPS Take 1,400 mg by mouth daily.     Paliperidone Palmitate ER (INVEGA HAFYERA) 1092 MG/3.5ML SUSY Inject 1,092 mg into the muscle every 6 (six) months. 1092 mL 11   traZODone (DESYREL) 100 MG tablet Take 1 tablet (100 mg total) by mouth at bedtime as needed for sleep. 30 tablet 3   WEGOVY 1 MG/0.5ML SOAJ Inject 1 mg into the skin once a week.     No current facility-administered medications for this visit.     Musculoskeletal: Strength & Muscle Tone: within normal limits Gait & Station: normal Patient leans: N/A  Psychiatric Specialty Exam: Review of Systems  There were no vitals taken for this visit.There is no height or weight on file to calculate BMI.  General Appearance: Well Groomed  Eye Contact:  Good  Speech:  Clear and Coherent and Normal Rate  Volume:  Normal  Mood:  Euthymic,   Affect:  Appropriate and Congruent  Thought Process:  Coherent, Goal Directed, and Linear  Orientation:  Full (Time, Place, and Person)  Thought Content: WDL and Logical   Suicidal Thoughts:  No  Homicidal Thoughts:  No  Memory:  Immediate;   Good Recent;   Good Remote;   Good  Judgement:  Good  Insight:  Good  Psychomotor Activity:  Normal  Concentration:  Concentration: Good and Attention Span: Good  Recall:  Good  Fund of Knowledge: Good  Language: Good  Akathisia:  No  Handed:  Right  AIMS (if indicated): not done  Assets:  Communication Skills Desire for Improvement Financial Resources/Insurance Housing Physical Health Social Support  ADL's:  Intact  Cognition: WNL  Sleep:  Good   Screenings: AIMS    Flowsheet Row Office Visit from 08/27/2022 in [facility] Office Visit from 05/28/2022 in [facility] Office Visit from 02/26/2022 in [facility] Admission (Discharged) [facility]  AIMS Total Score 4 0 1 0      AUDIT    Flowsheet Row Admission (Discharged) [facility]  Alcohol Use Disorder Identification Test Final Score (AUDIT) 1      GAD-7    Flowsheet Row Video Visit from 10/22/2022 in [facility] Video Visit from 09/15/2022 in [facility] Office Visit from 08/27/2022 in [facility] Office Visit from 05/28/2022 in [facility] Office Visit from 02/26/2022 in [facility]  Total GAD-7 Score 5 7 5  0 5      PHQ2-9    Flowsheet Row Video Visit from 10/22/2022 in [facility] ED from 10/08/2022 in [facility] Emergency Department at [facility] Video Visit from 09/15/2022 in [facility] Office Visit from 08/27/2022 in [facility] Office Visit from 05/28/2022 in [facility]  PHQ-2 Total Score 1 2 2 3  0  PHQ-9 Total Score 4 11 8 12  --      Flowsheet Row Video Visit from 10/22/2022 in [facility] Admission (Discharged) from 10/09/2022 in [facility] ED from 10/08/2022 in [facility] Emergency Department at [facility]  C-SSRS RISK CATEGORY Error: Q3, 4, or 5 should not be populated when Q2 is No No Risk Moderate Risk        Assessment and Plan: Today patient reports that he is doing well on his current medication regimen.  No medication changes made today.  Patient agreeable to continue medication as prescribed.  BuSpar not restarted at this time as it caused diarrhea.  Ingrezza also not restarted at this time.  1. Schizoaffective disorder, bipolar type (HCC)  Continue- OLANZapine zydis (ZYPREXA) 5 MG disintegrating tablet; Take 1 tablet (5 mg total) by mouth every 8 (eight) hours as needed  (agitation).  Dispense: 30 tablet; Refill: 3 Continue- Paliperidone Palmitate ER (INVEGA HAFYERA) 1092 MG/3.5ML SUSY; Inject 1,092 mg into the muscle every 6 (six) months.  Dispense: 1092 mL; Refill: 11 Continue- traZODone (DESYREL) 100 MG tablet; Take 1 tablet (100 mg total) by mouth at bedtime as needed for sleep.  Dispense: 30 tablet; Refill: 3  2. Anxiety state  Continue oral- hydrOXYzine (ATARAX) 25 MG tablet; Take 1 tablet (25 mg total) by mouth 3 (three) times daily.  Dispense: 90 tablet; Refill: 3    Follow-up in 3 months Follow-up therapy Shanna Cisco, NP 10/22/2022, 3:12 PM

## 2022-11-12 ENCOUNTER — Ambulatory Visit (HOSPITAL_COMMUNITY): Payer: No Typology Code available for payment source

## 2022-11-14 ENCOUNTER — Other Ambulatory Visit (HOSPITAL_COMMUNITY): Payer: Self-pay | Admitting: Psychiatry

## 2022-11-14 DIAGNOSIS — F250 Schizoaffective disorder, bipolar type: Secondary | ICD-10-CM

## 2022-11-18 ENCOUNTER — Other Ambulatory Visit (HOSPITAL_COMMUNITY): Payer: Self-pay | Admitting: Psychiatry

## 2022-11-18 DIAGNOSIS — F250 Schizoaffective disorder, bipolar type: Secondary | ICD-10-CM

## 2022-11-19 ENCOUNTER — Other Ambulatory Visit (HOSPITAL_COMMUNITY): Payer: Self-pay | Admitting: Psychiatry

## 2022-11-19 DIAGNOSIS — F250 Schizoaffective disorder, bipolar type: Secondary | ICD-10-CM

## 2022-11-24 ENCOUNTER — Other Ambulatory Visit (HOSPITAL_COMMUNITY): Payer: Self-pay | Admitting: Psychiatry

## 2022-11-24 DIAGNOSIS — F250 Schizoaffective disorder, bipolar type: Secondary | ICD-10-CM

## 2022-12-01 ENCOUNTER — Encounter (HOSPITAL_COMMUNITY): Payer: No Typology Code available for payment source | Admitting: Psychiatry

## 2022-12-02 ENCOUNTER — Other Ambulatory Visit (HOSPITAL_COMMUNITY): Payer: Self-pay | Admitting: Psychiatry

## 2022-12-02 DIAGNOSIS — F250 Schizoaffective disorder, bipolar type: Secondary | ICD-10-CM

## 2022-12-07 ENCOUNTER — Other Ambulatory Visit (HOSPITAL_COMMUNITY): Payer: Self-pay | Admitting: Psychiatry

## 2022-12-07 DIAGNOSIS — F250 Schizoaffective disorder, bipolar type: Secondary | ICD-10-CM

## 2022-12-08 ENCOUNTER — Other Ambulatory Visit (HOSPITAL_COMMUNITY): Payer: Self-pay | Admitting: Psychiatry

## 2022-12-08 DIAGNOSIS — F250 Schizoaffective disorder, bipolar type: Secondary | ICD-10-CM

## 2022-12-15 ENCOUNTER — Other Ambulatory Visit: Payer: Self-pay

## 2022-12-15 MED ORDER — BREZTRI AEROSPHERE 160-9-4.8 MCG/ACT IN AERO: INHALATION_SPRAY | RESPIRATORY_TRACT | 1 refills | Status: DC

## 2022-12-17 ENCOUNTER — Other Ambulatory Visit: Payer: Self-pay

## 2023-01-07 ENCOUNTER — Telehealth (HOSPITAL_COMMUNITY): Payer: No Typology Code available for payment source | Admitting: Psychiatry

## 2023-01-07 ENCOUNTER — Encounter (HOSPITAL_COMMUNITY): Payer: Self-pay | Admitting: Psychiatry

## 2023-01-07 DIAGNOSIS — F250 Schizoaffective disorder, bipolar type: Secondary | ICD-10-CM

## 2023-01-07 DIAGNOSIS — F900 Attention-deficit hyperactivity disorder, predominantly inattentive type: Secondary | ICD-10-CM | POA: Diagnosis not present

## 2023-01-07 MED ORDER — ATOMOXETINE HCL 40 MG PO CAPS
40.0000 mg | ORAL_CAPSULE | Freq: Every day | ORAL | 3 refills | Status: DC
Start: 2023-01-07 — End: 2023-01-08

## 2023-01-07 MED ORDER — INVEGA HAFYERA 1092 MG/3.5ML IM SUSY
1092.0000 mg | PREFILLED_SYRINGE | INTRAMUSCULAR | Status: DC
Start: 2023-01-07 — End: 2023-02-26

## 2023-01-07 NOTE — Progress Notes (Signed)
BH MD/PA/NP OP Progress Note Virtual Visit via Video Note  I connected with Nathaniel [name] on 01/07/23 at  2:00 PM EDT by a video enabled telemedicine application and verified that I am speaking with the correct person using two identifiers.  Location: Patient: Home Provider: Clinic   I discussed the limitations of evaluation and management by telemedicine and the availability of in person appointments. The patient expressed understanding and agreed to proceed.  I provided 30 minutes of non-face-to-face time during this encounter.     01/07/2023 12:26 PM Nathaniel [name]  MRN:  [id number]  Chief Complaint: "I stopped the olanzapine and hydroxyzine"  HPI:  22 year old male seen today for follow-up psychiatric evaluation. He has a psychiatric history of substance use, tobacco dependence, schizoaffective disorder bipolar type, and anxiety.  He is currently managed on Invega Hafyera 1092 every 6 months, Zyprexa 5 mg nightly, trazodone 100 mg nightly, and hydroxyzine 25-50 mg times daily as needed. He informed Clinical research associate that he discontinued Zyprexa and hydroxyzine.  He notes that he takes trazodone periodically.  He reports his medications are effective in managing his psychiatric condition.  Today he is well-groomed, pleasant, cooperative, engaged in conversation, maintaining eye contact.  He informed Clinical research associate that he discontinue Zyprexa because he did not see its benefits.  He informed Clinical research associate that since stopping it he is less fatigued and notes that his appetite is not as fast.  Patient informed Clinical research associate that he was on Wegovy but had to discontinue it due to side effects.  He asked further about other weight loss options.  Provider discussed other weight loss medications and told him to follow-up with his primary care doctor for further details.  He endorsed understanding and agreed.    Since his last visit he informed writer that his mood continues to be stable and notes that he has minimal anxiety and  depression.  Today provider conducted a GAD-7 and patient scored a 2, to his last visit he scored a 5.  Provider also conducted PHQ-9 the patient scored a 4, at his last visit he scored a 4.  He endorses adequate sleep and appetite.  Today he denies SI/HI/VAH, mania, paranoia.    Patient informed writer that since taking blood thinners his shortness of breath has improved.  He notes that he was encouraged to be more active however notes that he has not done this.  Patient also notes that his pulmonary embolisms are better managed.  Patient reports that he has returned to school at [employer].  He notes that he is taking 1 class and is thinking about pursuing a career in continuing education or art.  He also notes that he has considered cyber security as a potential career option.  Patient informed Clinical research associate that since restarting school he has been more inattentive, forgetful, and disorganized.  He requested to restart Strattera.  Provider was agreeable to this.  At this time patient does not wish to restart olanzapine or hydroxyzine.  He informed Clinical research associate that he is not in need of refills of trazodone at this time.  Today he is agreeable to restarting Strattera 40 mg to help manage symptoms of ADHD.  He will continue other medications as prescribed.  No other concerns at this time.  noted at this time.   Visit Diagnosis:    ICD-10-CM   1. Attention deficit hyperactivity disorder (ADHD), predominantly inattentive type  F90.0 atomoxetine (STRATTERA) 40 MG capsule    2. Schizoaffective disorder, bipolar type (HCC)  F25.0 Paliperidone Palmitate ER (  INVEGA HAFYERA) 1092 MG/3.5ML SUSY       Past Psychiatric History:  substance use, tobacco dependence, schizoaffective disorder bipolar type, and anxiety  Past Medical History:  Past Medical History:  Diagnosis Date   Allergic rhinitis    Chronic tonsillar hypertrophy    ENT did tonsillectomy 2020   Elevated blood pressure reading without diagnosis of  hypertension    IFG (impaired fasting glucose) 12/2017   Gluc 107, HbA1c 5.5%   Obesity, Class II, BMI 35-39.9    OSA (obstructive sleep apnea) 02/2018   Sleep study recommended by Dr. Ivory Broad considering it.   Severe persistent asthma    Dr. Sharyn Lull    Past Surgical History:  Procedure Laterality Date   TONSILLECTOMY  06/03/2018    Family Psychiatric History:  Denies  Family History:  Family History  Problem Relation Age of Onset   Heart attack Other    Prostate cancer Other    Hypertension Father    Hypertension Maternal Grandfather    Cancer Maternal Grandfather    Asthma Neg Hx     Social History:  Social History   Socioeconomic History   Marital status: Single    Spouse name: Not on file   Number of children: Not on file   Years of education: Not on file   Highest education level: Not on file  Occupational History   Not on file  Tobacco Use   Smoking status: Every Day    Current packs/day: 0.25    Types: Cigarettes    Passive exposure: Past   Smokeless tobacco: Never  Vaping Use   Vaping status: Never Used  Substance and Sexual Activity   Alcohol use: Yes    Comment: Rare   Drug use: No   Sexual activity: Yes  Other Topics Concern   Not on file  Social History Narrative   Single, lives with mom in [place].   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   No T/A/Ds.   Social Determinants of Health   Financial Resource Strain: Low Risk  (09/23/2022)   Received from [facility], [facility]   Overall Financial Resource Strain (CARDIA)    Difficulty of Paying Living Expenses: Not hard at all  Food Insecurity: No Food Insecurity (10/09/2022)   Hunger Vital Sign    Worried About Running Out of Food in the Last Year: Never true    Ran Out of Food in the Last Year: Never true  Transportation Needs: No Transportation Needs (10/09/2022)   PRAPARE - Administrator, Civil Service (Medical): No    Lack of  Transportation (Non-Medical): No  Physical Activity: Insufficiently Active (09/23/2022)   Received from [facility]   Exercise Vital Sign    Days of Exercise per Week: 1 day    Minutes of Exercise per Session: 30 min  Stress: Stress Concern Present (09/23/2022)   Received from [facility], [facility] of Occupational Health - Occupational Stress Questionnaire    Feeling of Stress : Rather much  Social Connections: Moderately Integrated (09/23/2022)   Received from [facility], [facility]   Social Network    How would you rate your social network (family, work, friends)?: Adequate participation with social networks    Allergies:  Allergies  Allergen Reactions   Amoxicillin Shortness Of Breath, Swelling and Other (See Comments)    Metabolic Disorder Labs: Lab Results  Component Value Date   HGBA1C  5.5 03/14/2020   MPG 111.15 03/14/2020   MPG 114 08/27/2014   No results found for: "PROLACTIN" Lab Results  Component Value Date   CHOL 180 03/14/2020   TRIG 136 03/14/2020   HDL 33 (L) 03/14/2020   CHOLHDL 5.5 03/14/2020   VLDL 27 03/14/2020   LDLCALC 120 (H) 03/14/2020   LDLCALC 92 02/28/2019   Lab Results  Component Value Date   TSH 2.519 03/15/2020   TSH 5.616 (H) 03/14/2020    Therapeutic Level Labs: No results found for: "LITHIUM" No results found for: "VALPROATE" No results found for: "CBMZ"  Current Medications: Current Outpatient Medications  Medication Sig Dispense Refill   albuterol (VENTOLIN HFA) 108 (90 Base) MCG/ACT inhaler Inhale 2 puffs into the lungs every 4 (four) hours as needed for wheezing or shortness of breath. (Patient taking differently: Inhale 1-2 puffs into the lungs every 4 (four) hours as needed for wheezing or shortness of breath.) 18 each 1   apixaban (ELIQUIS) 5 MG TABS tablet Take 2 tablets (10 mg total) by mouth 2 (two) times daily for 7 days. 28 tablet 0   apixaban (ELIQUIS) 5 MG TABS tablet  Take 1 tablet (5 mg total) by mouth 2 (two) times daily. 60 tablet 2   atomoxetine (STRATTERA) 40 MG capsule Take 1 capsule (40 mg total) by mouth daily. 30 capsule 3   Budeson-Glycopyrrol-Formoterol (BREZTRI AEROSPHERE) 160-9-4.8 MCG/ACT AERO INHALE 2 PUFFS INTO THE LUNGS TWICE A DAY 10.7 g 1   cetirizine (ZYRTEC) 10 MG tablet Take 1 tablet (10 mg total) by mouth daily. 90 tablet 1   Cholecalciferol (VITAMIN D-3) 125 MCG (5000 UT) TABS Take 5,000 Units by mouth daily.     lisinopril (ZESTRIL) 20 MG tablet Take 20 mg by mouth daily.     metoprolol succinate (TOPROL-XL) 50 MG 24 hr tablet Take 100 mg by mouth 2 (two) times daily.     Omega-3 1400 MG CAPS Take 1,400 mg by mouth daily.     Paliperidone Palmitate ER (INVEGA HAFYERA) 1092 MG/3.5ML SUSY Inject 1,092 mg into the muscle every 6 (six) months.     traZODone (DESYREL) 100 MG tablet TAKE 1 TABLET BY MOUTH AT BEDTIME AS NEEDED FOR SLEEP. 90 tablet 2   WEGOVY 1 MG/0.5ML SOAJ Inject 1 mg into the skin once a week.     No current facility-administered medications for this visit.     Musculoskeletal: Strength & Muscle Tone: within normal limits and telehealth visit Gait & Station: normal, telehealth visit Patient leans: N/A  Psychiatric Specialty Exam: Review of Systems  There were no vitals taken for this visit.There is no height or weight on file to calculate BMI.  General Appearance: Well Groomed  Eye Contact:  Good  Speech:  Clear and Coherent and Normal Rate  Volume:  Normal  Mood:  Euthymic,   Affect:  Appropriate and Congruent  Thought Process:  Coherent, Goal Directed, and Linear  Orientation:  Full (Time, Place, and Person)  Thought Content: WDL and Logical   Suicidal Thoughts:  No  Homicidal Thoughts:  No  Memory:  Immediate;   Good Recent;   Good Remote;   Good  Judgement:  Good  Insight:  Good  Psychomotor Activity:  Normal  Concentration:  Concentration: Good and Attention Span: Good  Recall:  Good  Fund of  Knowledge: Good  Language: Good  Akathisia:  No  Handed:  Right  AIMS (if indicated): not done  Assets:  Communication Skills Desire for Improvement  Financial Resources/Insurance Housing Physical Health Social Support  ADL's:  Intact  Cognition: WNL  Sleep:  Good   Screenings: AIMS    Flowsheet Row Office Visit from 08/27/2022 in [facility] Office Visit from 05/28/2022 in [facility] Office Visit from 02/26/2022 in [facility] Admission (Discharged[facility]  AIMS Total Score 4 0 1 0      AUDIT    Flowsheet Row Admission (Discharged) from [facility]  Alcohol Use Disorder Identification Test Final Score (AUDIT) 1      GAD-7    Flowsheet Row Video Visit from 01/07/2023 in [facility] Video Visit from 10/22/2022 in [facility] Video Visit from 09/15/2022 in [facility] Office Visit from 08/27/2022 in [facility] Office Visit from 05/28/2022 in [facility]  Total GAD-7 Score 2 5 7 5  0      PHQ2-9    Flowsheet Row Video Visit from 01/07/2023 in [facility] Video Visit from 10/22/2022 in [facility] ED from 10/08/2022 in [facility] Emergency Department at [facility] Video Visit from 09/15/2022 in [facility] Office Visit from 08/27/2022 in [facility]  PHQ-2 Total Score 0 1 2 2 3   PHQ-9 Total Score 4 4 11 8 12       Flowsheet Row Video Visit from 10/22/2022 in [facility] Admission (Discharged) from 10/09/2022 in [facility] ED from 10/08/2022 in [facility] Emergency  Department at [facility]  C-SSRS RISK CATEGORY Error: Q3, 4, or 5 should not be populated when Q2 is No No Risk Moderate Risk        Assessment and Plan: Patient informed writer that his mood is stable and notes that he has minimal anxiety and depression.  He does endorse symptoms of ADHD and request to restart Strattera.  Today Strattera 40 mg daily restarted to help manage symptoms of ADHD.  At this time he does not wish to restart Zyprexa as he disliked its side effects (weight gain, and increased appetite).  He also does not wish to restart hydroxyzine.  Patient notes that he is not in need of refills for trazodone.  He will continue all of his other medications as prescribed. 1. Schizoaffective disorder, bipolar type (HCC)  Continue- Paliperidone Palmitate ER (INVEGA HAFYERA) 1092 MG/3.5ML SUSY; Inject 1,092 mg into the muscle every 6 (six) months.  2.  Attention deficit hyperactivity disorder (ADHD), predominantly inattentive type  Continue- atomoxetine (STRATTERA) 40 MG capsule; Take 1 capsule (40 mg total) by mouth daily.  Dispense: 30 capsule; Refill: 3     Follow-up in 3 months Follow-up therapy Shanna Cisco, NP 01/07/2023, 12:26 PM

## 2023-01-08 ENCOUNTER — Telehealth (HOSPITAL_COMMUNITY): Payer: Self-pay | Admitting: Psychiatry

## 2023-01-08 ENCOUNTER — Other Ambulatory Visit (HOSPITAL_COMMUNITY): Payer: Self-pay | Admitting: Psychiatry

## 2023-01-08 DIAGNOSIS — F900 Attention-deficit hyperactivity disorder, predominantly inattentive type: Secondary | ICD-10-CM

## 2023-01-08 MED ORDER — ATOMOXETINE HCL 40 MG PO CAPS: 40.0000 mg | ORAL_CAPSULE | Freq: Every day | ORAL | 1 refills | Status: DC

## 2023-01-08 NOTE — Telephone Encounter (Signed)
Medication refilled and sent to preferred pharmacy.  Patient and his mother notified.

## 2023-02-16 ENCOUNTER — Encounter: Payer: Self-pay | Admitting: Allergy & Immunology

## 2023-02-16 ENCOUNTER — Ambulatory Visit: Payer: No Typology Code available for payment source | Admitting: Allergy & Immunology

## 2023-02-16 ENCOUNTER — Other Ambulatory Visit: Payer: Self-pay

## 2023-02-16 VITALS — BP 110/60 | HR 95 | Temp 98.0°F | Resp 20 | Ht 73.0 in | Wt 341.4 lb

## 2023-02-16 DIAGNOSIS — J4550 Severe persistent asthma, uncomplicated: Secondary | ICD-10-CM | POA: Diagnosis not present

## 2023-02-16 DIAGNOSIS — J3089 Other allergic rhinitis: Secondary | ICD-10-CM | POA: Diagnosis not present

## 2023-02-16 NOTE — Progress Notes (Signed)
FOLLOW UP  Date of Service/Encounter:  02/16/23   Assessment:   Moderate persistent asthma, uncomplicated - well controlled  Recent PE - on Eliquis    Allergic rhinoconjunctivitis   Complicated past medical history including schizoaffective disorder  Plan/Recommendations:   Moderate persistent asthma, uncomplicated - Lung testing looked excellent today. - Daily controller medication(s): Breztri two puffs in the morning - Prior to physical activity: albuterol 2 puffs 10-15 minutes before physical activity. - Rescue medications: albuterol 4 puffs every 4-6 hours as needed - Changes during respiratory infections or worsening symptoms: Increase Breztri to 2 puffs twice daily for TWO WEEKS. - Asthma control goals:  * Full participation in all desired activities (may need albuterol before activity) * Albuterol use two time or less a week on average (not counting use with activity) * Cough interfering with sleep two time or less a month * Oral steroids no more than once a year * No hospitalizations  2. Allergic rhinitis - Continue with Zyrtec once daily.  - Continue with Flonase one or two sprays per nostril daily.   3. Return in about 6 months (around 08/17/2023). You can have the follow up appointment with Dr. Dellis Anes or a Nurse Practicioner (our Nurse Practitioners are excellent and always have Physician oversight!).   Subjective:   Nathaniel [name] is a 22 y.o. male presenting today for follow up of  Chief Complaint  Patient presents with   Follow-up    Pt states that he had developed a blood clot in May. Here for a follow up.    Nathaniel [name] has a history of the following: Patient Active Problem List   Diagnosis Date Noted   Acute pulmonary embolism (HCC) 10/09/2022   Lower extremity weakness 10/09/2022   Schizoaffective disorder, depressive type (HCC) 10/08/2022   Insomnia due to mental disorder 10/08/2022   Suicidal ideations 10/08/2022   Attention deficit  hyperactivity disorder (ADHD), predominantly inattentive type 02/05/2022   Schizoaffective disorder, bipolar type (HCC) 11/03/2021   Generalized anxiety disorder 03/14/2020   Psychoactive substance-induced psychosis (HCC) 03/13/2020   Obstructive sleep apnea of adult 03/22/2018   Hypertrophy, nasal, turbinate 03/22/2018   Sore throat 07/13/2017   Hypogonadism male 04/02/2015   Acquired acanthosis nigricans 11/27/2014   Pre-diabetes 07/29/2013   Morbid obesity (HCC) 07/29/2013   Goiter 07/29/2013   Dyspepsia 07/29/2013   Essential hypertension, benign 07/29/2013   Gynecomastia, male 07/29/2013   PHARYNGITIS 04/16/2008   Acute upper respiratory infection 04/16/2008   INFLUENZA WITH OTHER RESPIRATORY MANIFESTATIONS 02/06/2008   HERPETIC WHITLOW 12/12/2007   Herpes simplex virus (HSV) infection 12/12/2007   MIXED RECEPTIVE-EXPRESSIVE LANGUAGE DISORDER 12/12/2007   Constipation 12/12/2007   URTICARIA 12/12/2007   Fetal and neonatal jaundice 12/12/2007   CARDIAC FLOW MURMUR 12/12/2007   Perennial allergic rhinitis 12/09/2007   Asthma 12/09/2007    History obtained from: chart review and patient.  Discussed the use of AI scribe software for clinical note transcription with the patient and/or guardian, who gave verbal consent to proceed.  Nathaniel [name] is a 22 y.o. male presenting for a follow up visit.  He was last seen in May 2024.  At that time, lung testing looks stable.  He had stopped smoking.  We continue with Breztri 2 puffs in the morning, increasing to 2 puffs twice daily for a couple weeks during flares.  For his allergic rhinitis, we resumed his Flonase and continue with Zyrtec.  Since the last visit, he has mostly done well. But he was in the hospital  for a PE in June 2024. He is currently on Eliquis for a blood clot, reports that he is due to stop this medication in December. He mentions that his primary care provider has suggested a genetic test to determine the likelihood of  recurrence.   Asthma/Respiratory Symptom History: The patient has a history of asthma and has been managing his symptoms with Breztri, which he takes two puffs of once a day. He reports that he had to increase the dosage temporarily due to difficulty breathing, which he later attributed to his pulmonary embolism rather than a change in season. Since returning to his regular dosage, he has been stable and has not experienced any shortness of breath or chest pain. The patient admits to occasional smoking, but insists that it is very rare and he does not purchase his own cigarettes or vapes.  Allergic Rhinitis Symptom History:  He also reports that he has been managing his allergies with over-the-counter Zyrtec and Flonase nasal spray, which he uses every night.. this has been working well to control his symptoms.   The patient also has a diagnosis of schizoaffective disorder and is on antipsychotic medication. He reports that this medication increases his appetite, which has led to weight gain. To counteract this, he has been prescribed a weight loss medication, which he believes will be more effective once he reaches a higher dosage. He also mentions that he was previously on [facility] for weight loss, but stopped due to side effects that he later realized were symptoms of his pulmonary embolism.   The mood stabilizers are working well to control his schizoaffective disorder, but they have led to an increase in his appetite. He was previously working out routinely to control his weight.  Otherwise, there have been no changes to his past medical history, surgical history, family history, or social history.    Review of systems otherwise negative other than that mentioned in the HPI.    Objective:   Blood pressure 110/60, pulse 95, temperature 98 F (36.7 C), resp. rate 20, height 6\' 1"  (1.854 m), weight (!) 341 lb 6.4 oz (154.9 kg), SpO2 98%. Body mass index is 45.04 kg/m.    Physical  Exam Vitals reviewed.  Constitutional:      Appearance: He is well-developed.     Comments: Very talkative. Friendly.   HENT:     Head: Normocephalic and atraumatic.     Right Ear: Tympanic membrane, ear canal and external ear normal.     Left Ear: Tympanic membrane, ear canal and external ear normal.     Nose: No nasal deformity, septal deviation, mucosal edema or rhinorrhea.     Right Turbinates: Enlarged, swollen and pale.     Left Turbinates: Enlarged, swollen and pale.     Right Sinus: No maxillary sinus tenderness or frontal sinus tenderness.     Left Sinus: No maxillary sinus tenderness or frontal sinus tenderness.     Comments: No nasal polyps.    Mouth/Throat:     Lips: Pink.     Mouth: Mucous membranes are moist. Mucous membranes are not pale and not dry.     Pharynx: Uvula midline.     Comments: Cobblestoning in the posterior oropharynx. Eyes:     General: Lids are normal. Allergic shiner present.        Right eye: No discharge.        Left eye: No discharge.     Conjunctiva/sclera: Conjunctivae normal.     Right eye: Right  conjunctiva is not injected. No chemosis.    Left eye: Left conjunctiva is not injected. No chemosis.    Pupils: Pupils are equal, round, and reactive to light.  Cardiovascular:     Rate and Rhythm: Normal rate and regular rhythm.     Heart sounds: Normal heart sounds.  Pulmonary:     Effort: Pulmonary effort is normal. No tachypnea, accessory muscle usage or respiratory distress.     Breath sounds: Normal breath sounds. No wheezing, rhonchi or rales.     Comments: Moving air well in all lung fields. No increased work of breathing noted.  Chest:     Chest wall: No tenderness.  Lymphadenopathy:     Cervical: No cervical adenopathy.  Skin:    Coloration: Skin is not pale.     Findings: No abrasion, erythema, petechiae or rash. Rash is not papular, urticarial or vesicular.  Neurological:     Mental Status: He is alert.  Psychiatric:         Behavior: Behavior is cooperative.      Diagnostic studies:    Spirometry: results normal (FEV1: 4.10/85%, FVC: 4.82/84%, FEV1/FVC: 85%).    Spirometry consistent with normal pattern.   Allergy Studies: none        Malachi Bonds, MD  [facility]

## 2023-02-16 NOTE — Patient Instructions (Addendum)
Moderate persistent asthma, uncomplicated - Lung testing looked excellent today. - Daily controller medication(s): Breztri two puffs in the morning - Prior to physical activity: albuterol 2 puffs 10-15 minutes before physical activity. - Rescue medications: albuterol 4 puffs every 4-6 hours as needed - Changes during respiratory infections or worsening symptoms: Increase Breztri to 2 puffs twice daily for TWO WEEKS. - Asthma control goals:  * Full participation in all desired activities (may need albuterol before activity) * Albuterol use two time or less a week on average (not counting use with activity) * Cough interfering with sleep two time or less a month * Oral steroids no more than once a year * No hospitalizations  2. Allergic rhinitis - Continue with Zyrtec once daily.  - Continue with Flonase one or two sprays per nostril daily.   3. Return in about 6 months (around 08/17/2023). You can have the follow up appointment with Dr. Dellis Anes or a Nurse Practicioner (our Nurse Practitioners are excellent and always have Physician oversight!).    Please inform us of any Emergency Department visits, hospitalizations, or changes in symptoms. Call us before going to the ED for breathing or allergy symptoms since we might be able to fit you in for a sick visit. Feel free to contact us anytime with any questions, problems, or concerns.  It was a pleasure to see you again today!   Websites that have reliable patient information: 1. American Academy of Asthma, Allergy, and Immunology: www.aaaai.org 2. Food Allergy Research and Education (FARE): foodallergy.org 3. Mothers of Asthmatics: http://www.asthmacommunitynetwork.org 4. American College of Allergy, Asthma, and Immunology: www.acaai.org   COVID-19 Vaccine Information can be found at: [email or web address].[email or web address] For questions related to vaccine distribution or appointments, please email  [email or web address] or call [phone].     "Like" [place] on Facebook and Instagram for our latest updates!      A healthy democracy works best when [employer] participate! Make sure you are registered to vote! If you have moved or changed any of your contact information, you will need to get this updated before voting! Scan the QR codes below to learn more!

## 2023-02-25 ENCOUNTER — Ambulatory Visit (HOSPITAL_COMMUNITY): Payer: No Typology Code available for payment source

## 2023-02-25 ENCOUNTER — Other Ambulatory Visit: Payer: Self-pay

## 2023-02-25 ENCOUNTER — Encounter (HOSPITAL_COMMUNITY): Payer: Self-pay

## 2023-02-25 VITALS — BP 135/91 | HR 98 | Ht 73.0 in | Wt 340.0 lb

## 2023-02-25 DIAGNOSIS — F250 Schizoaffective disorder, bipolar type: Secondary | ICD-10-CM | POA: Diagnosis not present

## 2023-02-25 IMAGING — DX DG CHEST 2V
2 series · 2 of 2 positions shown · non-contrast
Comparison: April 18, 2016

CLINICAL DATA: chest pain, dizziness and shortness of breath

EXAM:
CHEST - 2 VIEW

[chest pa]
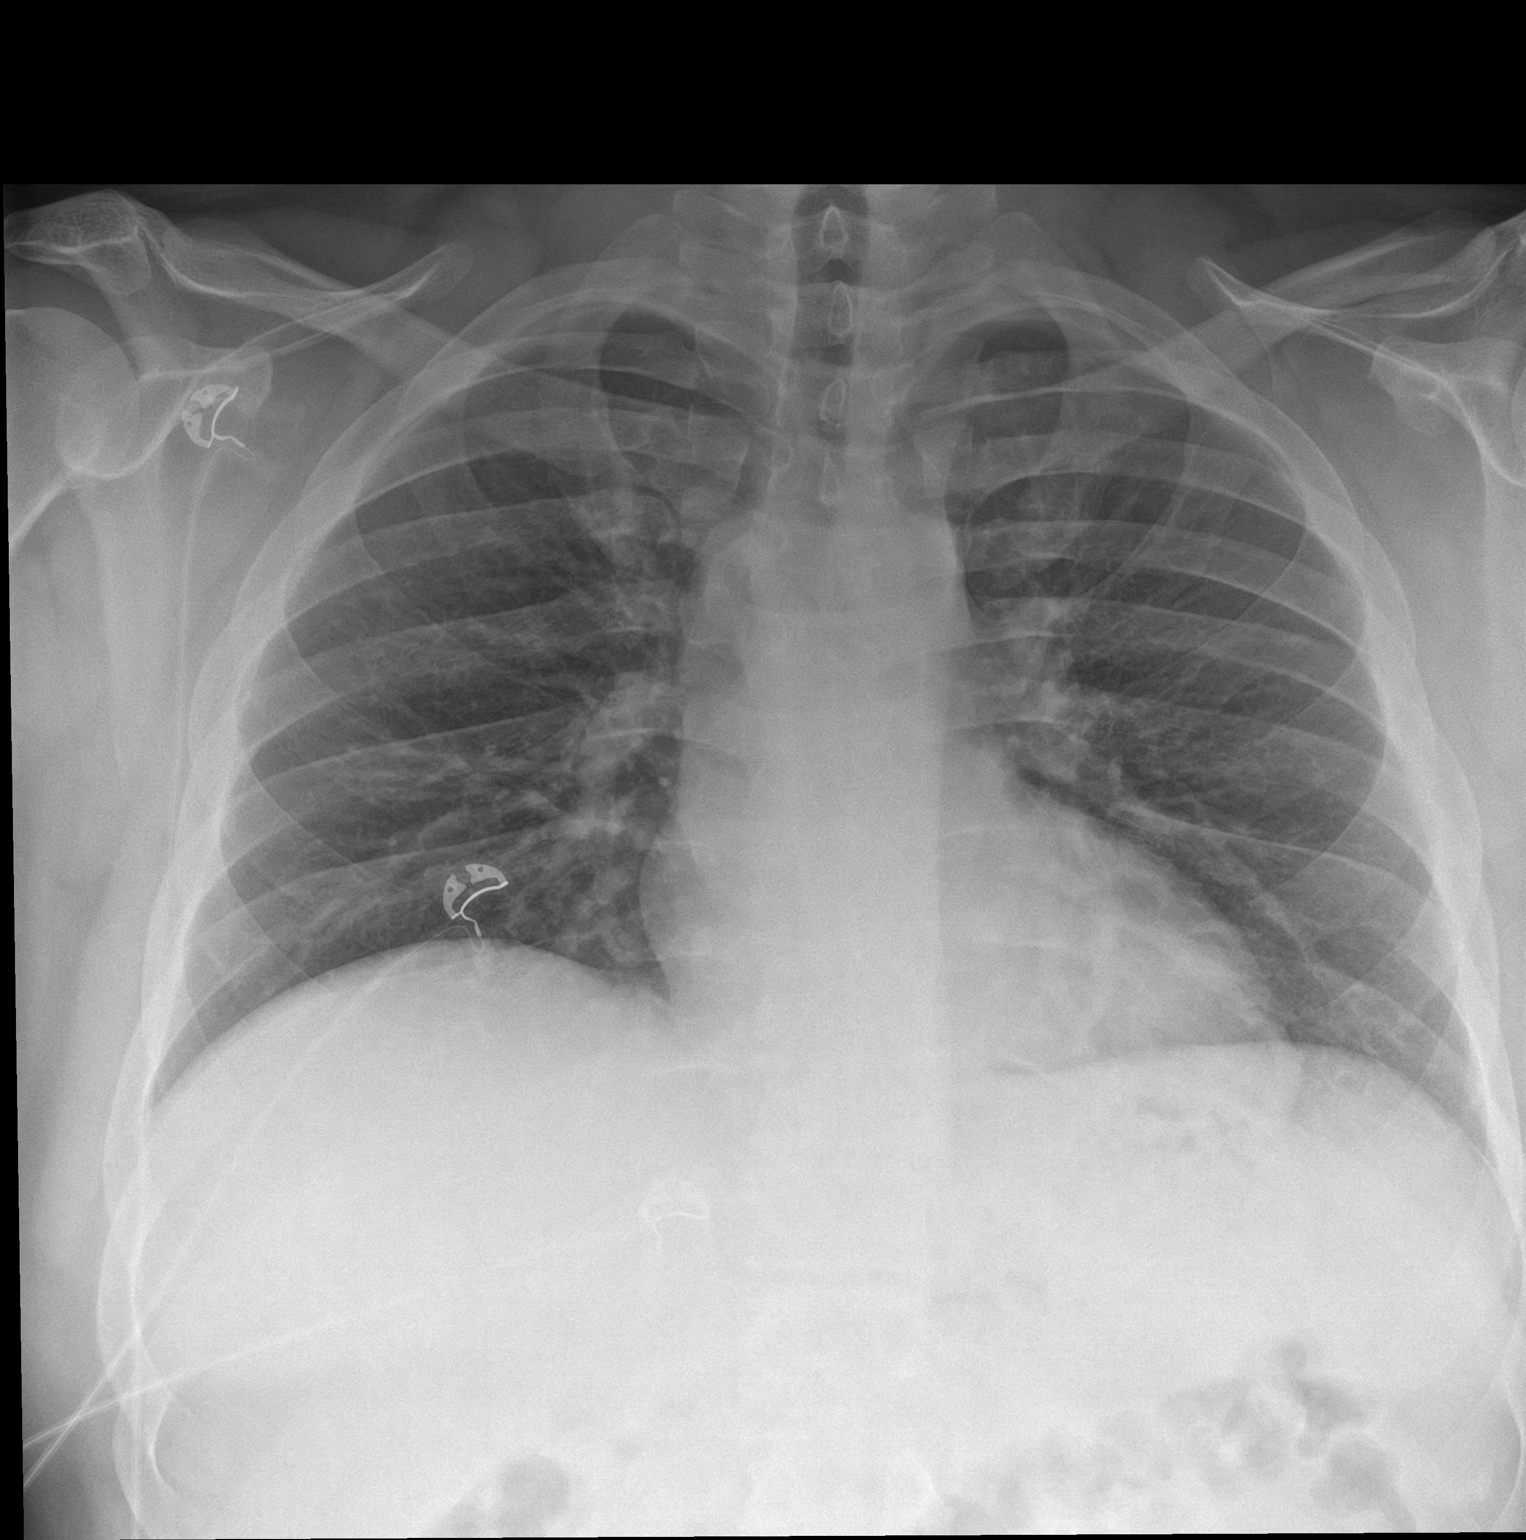

[chest lat]
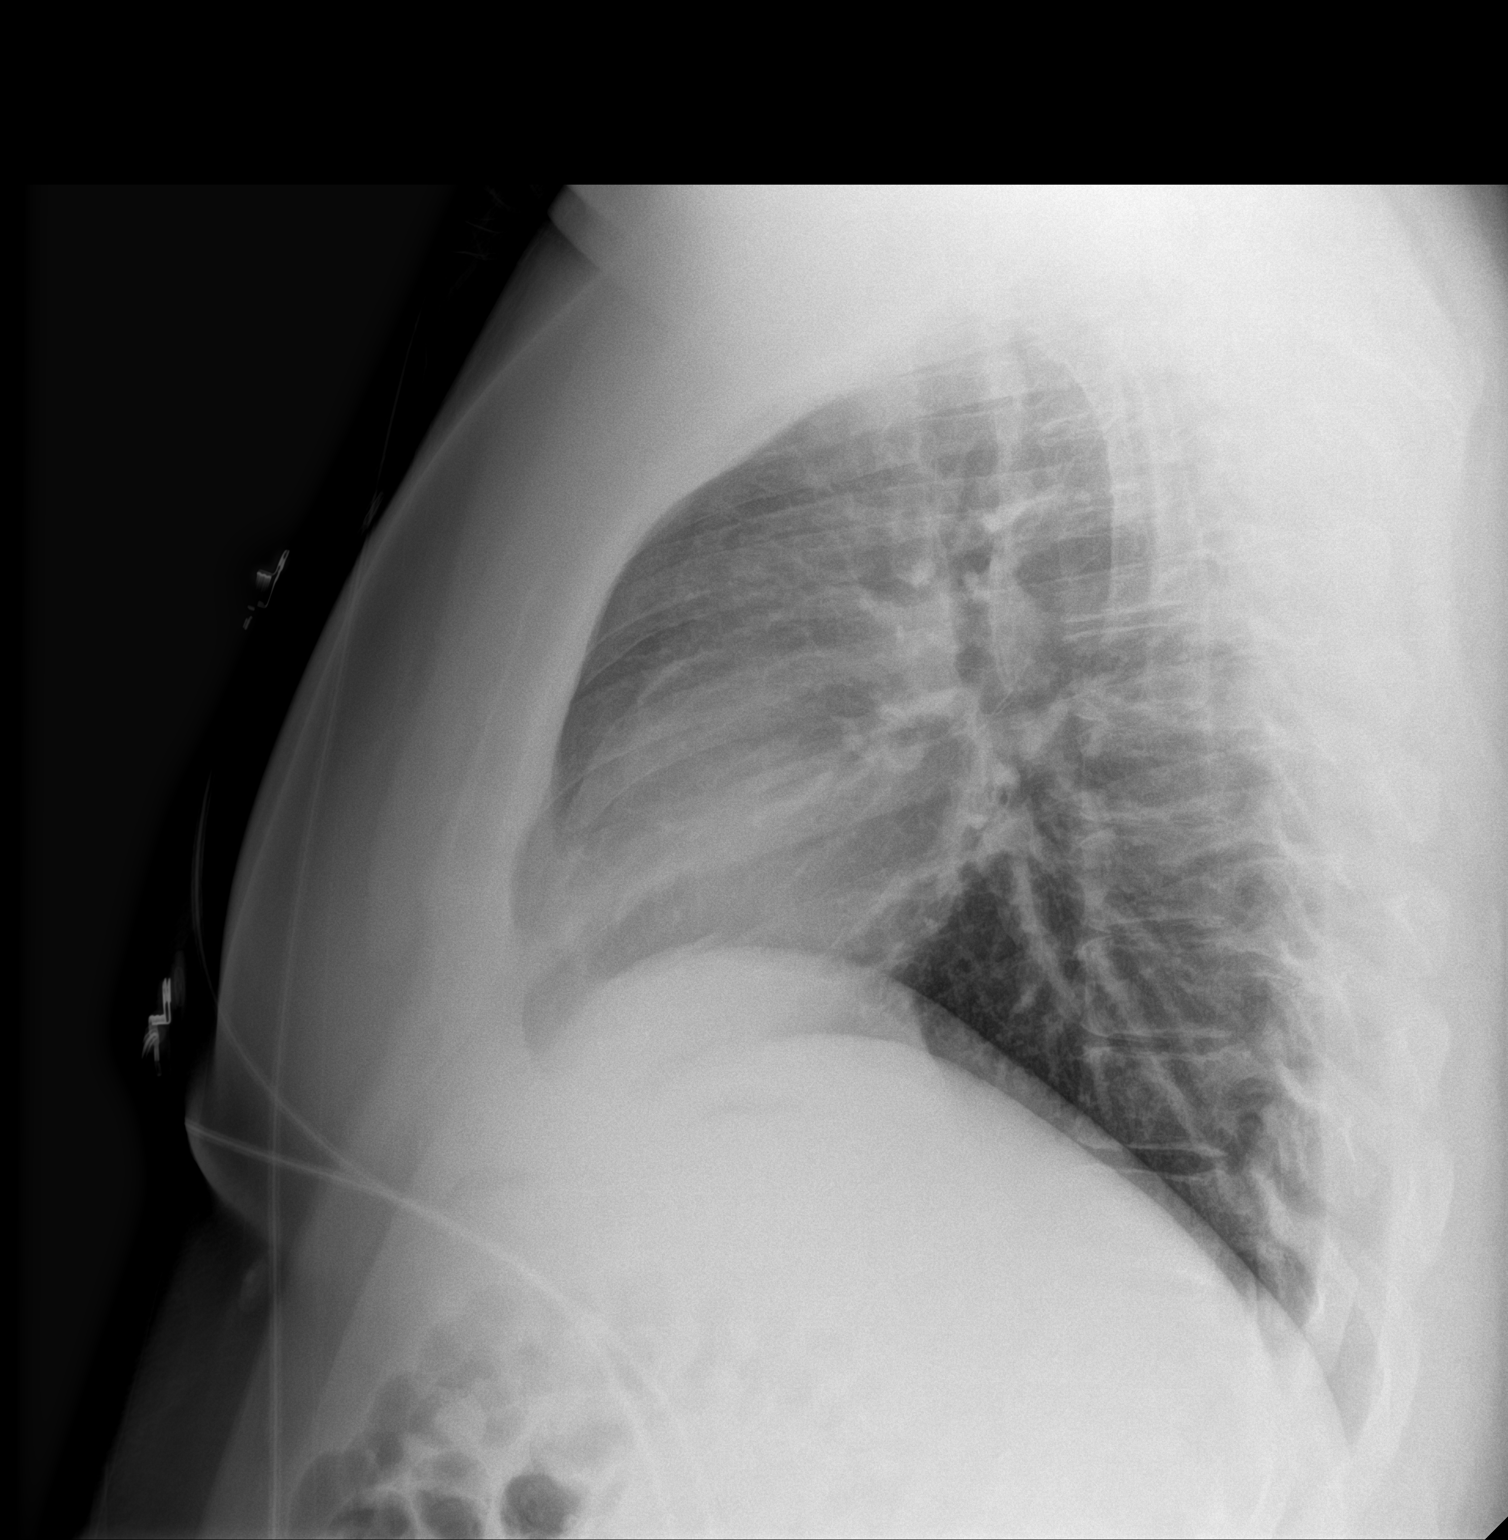

[2 of 2 positions shown; findings below may reference images not displayed]

FINDINGS: The heart size and mediastinal contours are within normal limits.
Both lungs are clear. The visualized skeletal structures are
unremarkable.
IMPRESSION: No active cardiopulmonary disease.

## 2023-02-25 IMAGING — CT CT ANGIO CHEST
2 of 7 series · 17 of 46 positions shown · IV contrast (agent unspecified)
Comparison: None Available.

CLINICAL DATA: Chest pain and dizziness with leg pain and cramps.

EXAM:
CT ANGIOGRAPHY CHEST WITH CONTRAST
TECHNIQUE: Multidetector CT imaging of the chest was performed using the
standard protocol during bolus administration of intravenous
contrast. Multiplanar CT image reconstructions and MIPs were
obtained to evaluate the vascular anatomy.

[Series 6: pe axial thins · axial · 0.98mm/px · z∈[+1017,+1267]mm · 14 of 290 slices shown]
[im 20/290  lung]
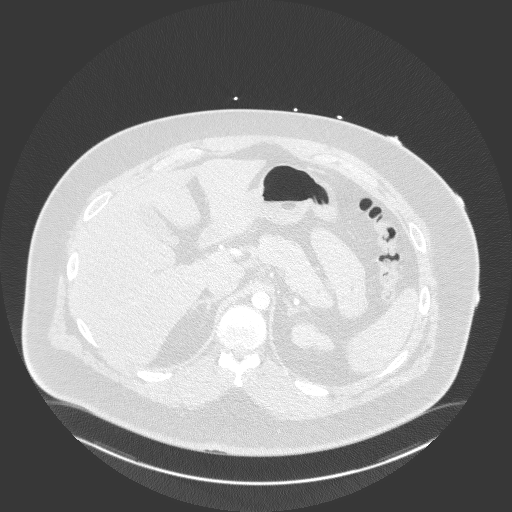
[im 39/290  soft-tissue]
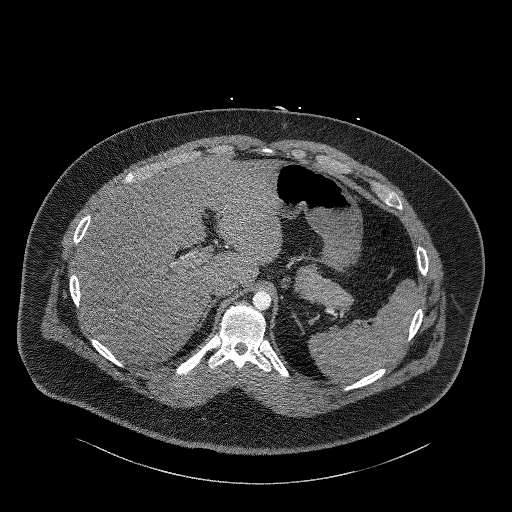
[im 58/290  lung]
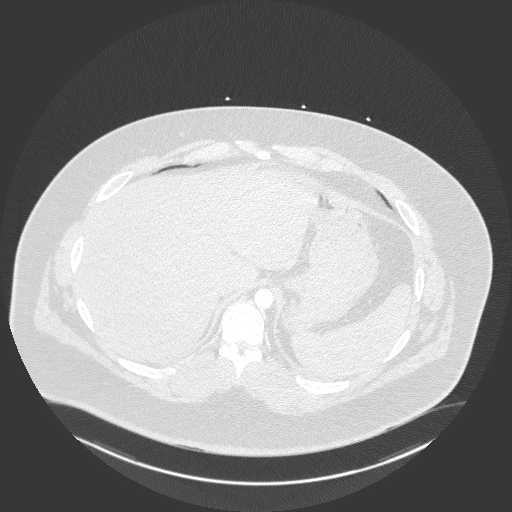
[im 78/290  soft-tissue]
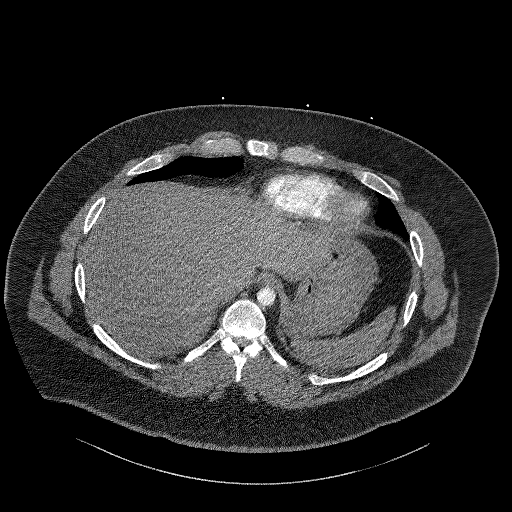
[im 97/290  lung]
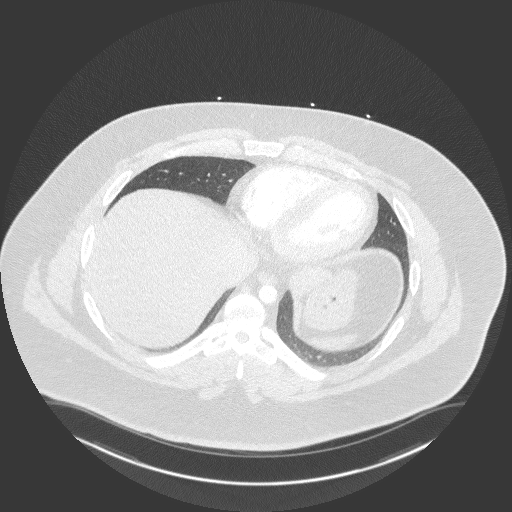
[im 116/290  soft-tissue]
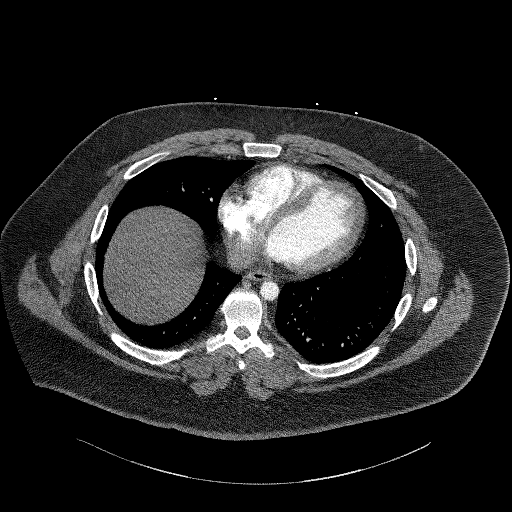
[im 135/290  lung]
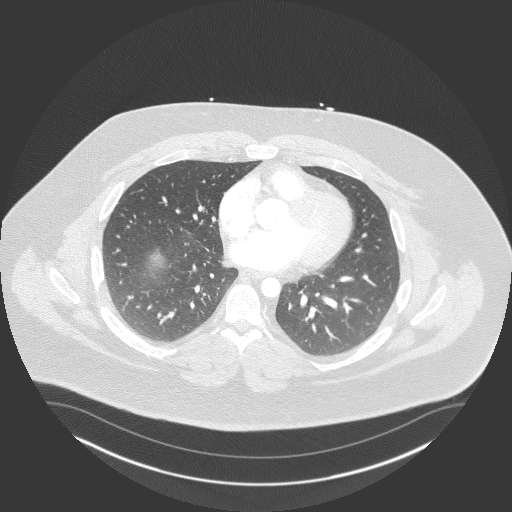
[im 155/290  soft-tissue]
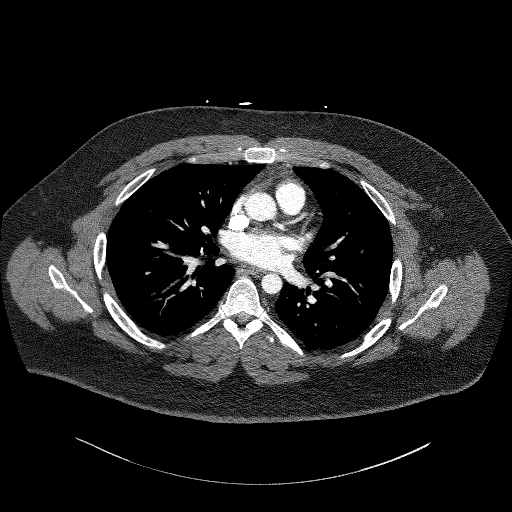
[im 174/290  lung]
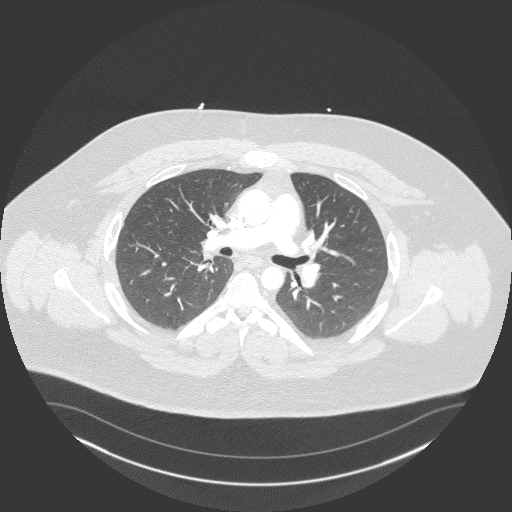
[im 193/290  soft-tissue]
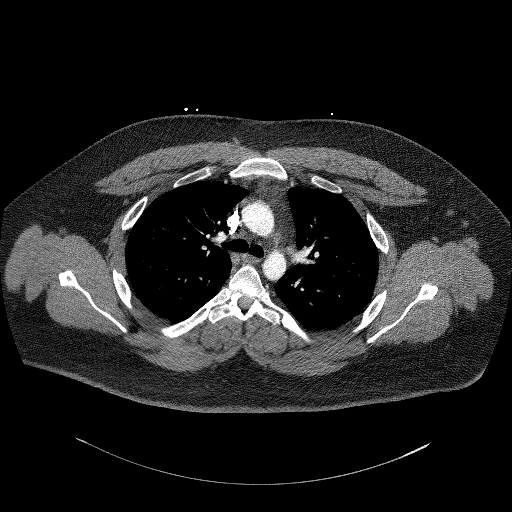
[im 212/290  lung]
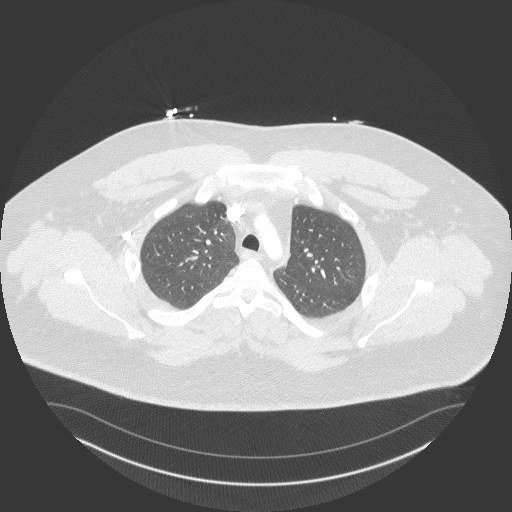
[im 232/290  soft-tissue]
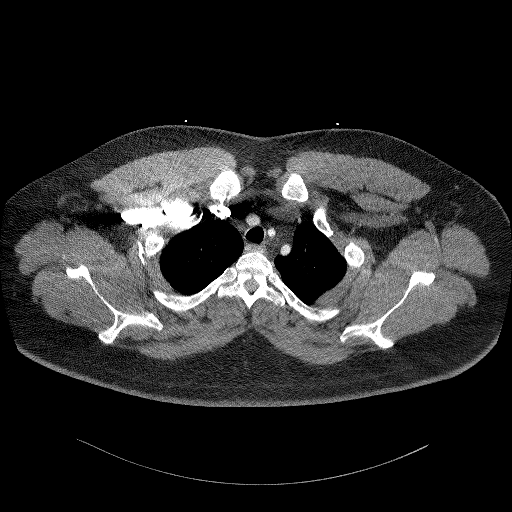
[im 251/290  lung]
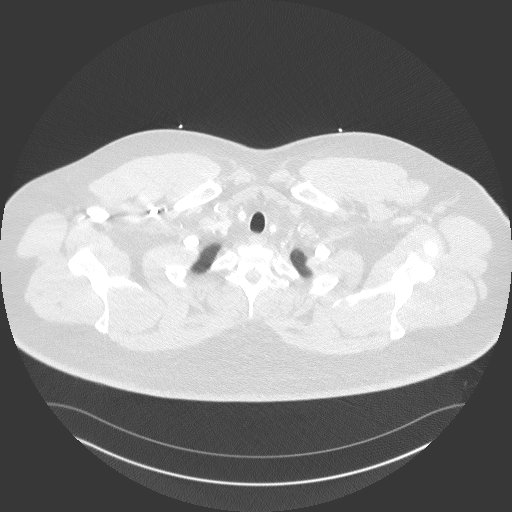
[im 270/290  soft-tissue]
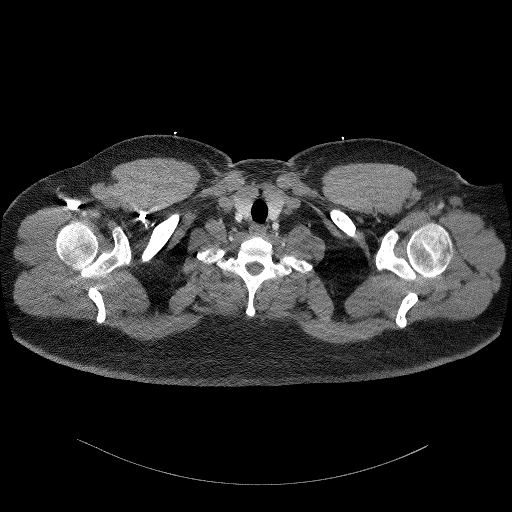

[Series 8: cor soft · coronal · 0.63mm/px · 3 of 176 slices shown]
[im 44/176  soft-tissue]
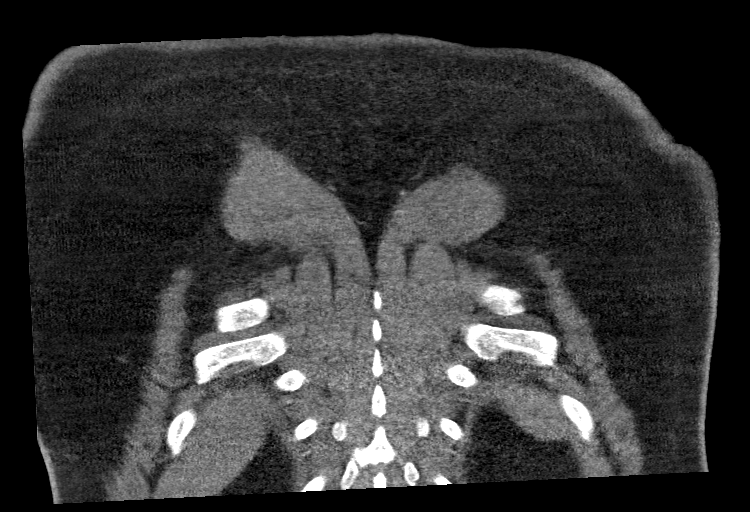
[im 88/176  soft-tissue]
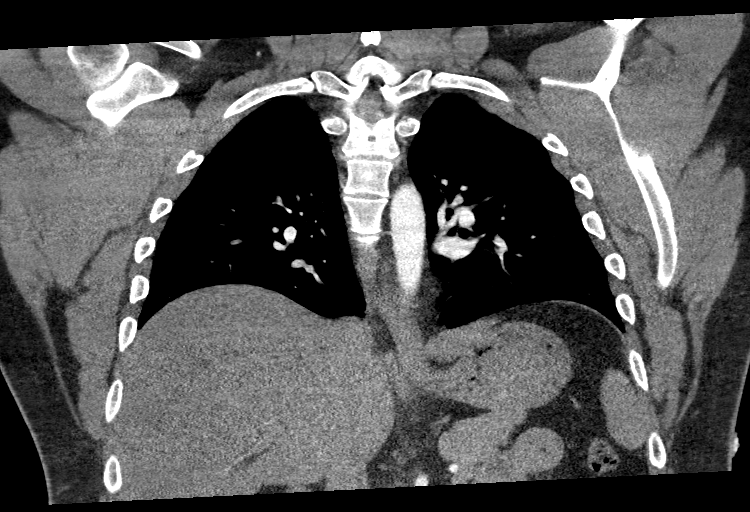
[im 132/176  soft-tissue]
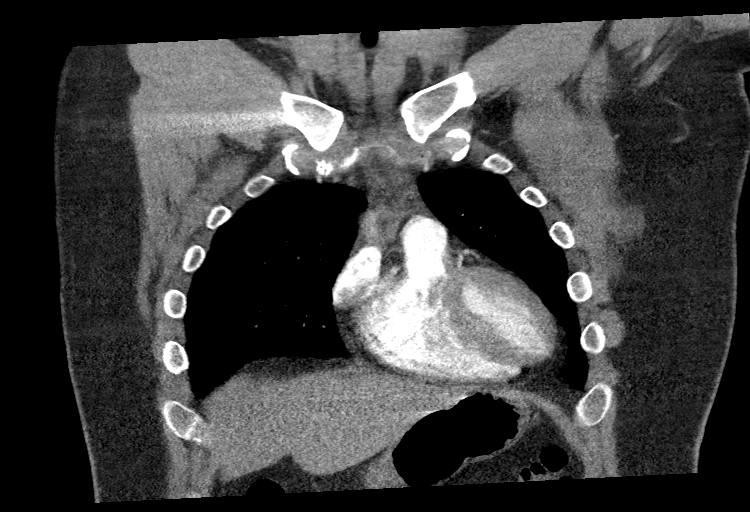

[17 of 46 positions shown; findings below may reference images not displayed]

RADIATION DOSE REDUCTION: This exam was performed according to the
departmental dose-optimization program which includes automated
exposure control, adjustment of the mA and/or kV according to
patient size and/or use of iterative reconstruction technique.

CONTRAST:  80mL OMNIPAQUE IOHEXOL 350 MG/ML SOLN
FINDINGS: Cardiovascular: Satisfactory opacification of the pulmonary arteries
to the segmental level. No evidence of pulmonary embolism. Normal
heart size. No pericardial effusion.

Mediastinum/Nodes: No enlarged mediastinal, hilar, or axillary lymph
nodes. Thyroid gland, trachea, and esophagus demonstrate no
significant findings.

Lungs/Pleura: Lungs are clear. No pleural effusion or pneumothorax.

Upper Abdomen: No acute abnormality.

Musculoskeletal: No chest wall abnormality. No acute or significant
osseous findings.

Review of the MIP images confirms the above findings.
IMPRESSION: No CT evidence of pulmonary embolism or other acute cardiopulmonary
disease.

## 2023-02-25 NOTE — Progress Notes (Cosign Needed)
Patient presents today for his injection of Invega Hafyera 1092mg /mL. Patient was pleasant and cooperative. Patient denies any SI/HI or AVH. Injection prepared as ordered and administered in patients Left upper outer quadrant. Patient tolerated well and without complaint.     NDC: 16109-604-54 - this is the correct NDC - it is in the [facility] as -612- LOT: [id number] EXP: 2025 - DEC

## 2023-02-26 ENCOUNTER — Encounter (HOSPITAL_COMMUNITY): Payer: Self-pay | Admitting: Psychiatry

## 2023-02-26 ENCOUNTER — Telehealth (INDEPENDENT_AMBULATORY_CARE_PROVIDER_SITE_OTHER): Payer: No Typology Code available for payment source | Admitting: Psychiatry

## 2023-02-26 DIAGNOSIS — F900 Attention-deficit hyperactivity disorder, predominantly inattentive type: Secondary | ICD-10-CM

## 2023-02-26 DIAGNOSIS — F250 Schizoaffective disorder, bipolar type: Secondary | ICD-10-CM | POA: Diagnosis not present

## 2023-02-26 MED ORDER — BUPROPION HCL ER (XL) 150 MG PO TB24
150.0000 mg | ORAL_TABLET | ORAL | 3 refills | Status: DC
Start: 2023-02-26 — End: 2023-03-22

## 2023-02-26 MED ORDER — TRAZODONE HCL 100 MG PO TABS: 100.0000 mg | ORAL_TABLET | Freq: Every evening | ORAL | 2 refills | Status: DC | PRN

## 2023-02-26 MED ORDER — ATOMOXETINE HCL 80 MG PO CAPS: 80.0000 mg | ORAL_CAPSULE | Freq: Every day | ORAL | 3 refills | Status: DC

## 2023-02-26 MED ORDER — INVEGA HAFYERA 1092 MG/3.5ML IM SUSY: 1092.0000 mg | PREFILLED_SYRINGE | INTRAMUSCULAR | Status: DC

## 2023-02-26 NOTE — Progress Notes (Signed)
BH MD/PA/NP OP Progress Note Virtual Visit via Video Note  I connected with Nathaniel [name] on 02/26/23 at 11:30 AM EDT by a video enabled telemedicine application and verified that I am speaking with the correct person using two identifiers.  Location: Patient: Home Provider: Clinic   I discussed the limitations of evaluation and management by telemedicine and the availability of in person appointments. The patient expressed understanding and agreed to proceed.  I provided 30 minutes of non-face-to-face time during this encounter.     02/26/2023 12:06 PM Nathaniel [name]  MRN:  [id number]  Chief Complaint: "I feel down and I am not interested in activities"  HPI:  22 year old male seen today for follow-up psychiatric evaluation. He has a psychiatric history of substance use, tobacco dependence, schizoaffective disorder bipolar type, and anxiety.  He is currently managed on Invega Hafyera 1092 every 6 months, Strattera 40 mg daily, and trazodone 100 mg nightly. He reports his medications are somewhat effective in managing his psychiatric condition.  Today he is well-groomed, pleasant, cooperative, engaged in conversation, maintaining eye contact.  He informed Clinical research associate that he has been down and not interested in activities he once enjoyed. He notes that he feels overwhelmed and over thinking. At patients last visit he discontinue Zyprexa and hydroxyzine because he did not see its benefits. He notes that he does not wish to restart those medications.    Patient reports that he attends [employer] where he is getting a degree in general studies.  He notes that he wants to go to a 4-year college and potentially study cyber security.  Patient notes that he wants to be more motivated to do these things as he no longer has desire to go to school or do things that he one did.  He reports that he wants to be more proactive with exercising as he is overweight and has hypertension. Today provider conducted a GAD-7 and  patient scored a 7, at his last visit he scored a 2.  Provider also conducted PHQ-9 the patient scored an 11, at his last visit he scored a 4.  He endorses adequate sleep and appetite.  Patient notes that at times he feels that he has auditory hallucinations but notes that this is infrequent.  Today he denies SI/HI/AVH, mania, or paranoia.  Patient informed Clinical research associate that he continues to be inattentive, has poor concentration, poor listening skills, and is unable to focus on things that are mentally taxing.  He notes that Strattera was not as effective as he would like it to be.  Today patient agreeable to starting Wellbutrin XL 150 mg to help manage depression and concentration.  Strattera also increased from 40 mg to 80 mg to help manage symptoms of ADHD.  He will continue other medications as prescribed. Potential side effects of medication and risks vs benefits of treatment vs non-treatment were explained and discussed. All questions were answered.  No other concerns at this time.  noted at this time.   Visit Diagnosis:    ICD-10-CM   1. Attention deficit hyperactivity disorder (ADHD), predominantly inattentive type  F90.0 atomoxetine (STRATTERA) 80 MG capsule    buPROPion (WELLBUTRIN XL) 150 MG 24 hr tablet    2. Schizoaffective disorder, bipolar type (HCC)  F25.0 traZODone (DESYREL) 100 MG tablet    Paliperidone Palmitate ER (INVEGA HAFYERA) 1092 MG/3.5ML SUSY        Past Psychiatric History:  substance use, tobacco dependence, schizoaffective disorder bipolar type, and anxiety  Past Medical History:  Past  Medical History:  Diagnosis Date   Allergic rhinitis    Chronic tonsillar hypertrophy    ENT did tonsillectomy 2020   Elevated blood pressure reading without diagnosis of hypertension    IFG (impaired fasting glucose) 12/2017   Gluc 107, HbA1c 5.5%   Obesity, Class II, BMI 35-39.9    OSA (obstructive sleep apnea) 02/2018   Sleep study recommended by Dr. Ivory Broad considering it.    Pulmonary embolism (HCC) 09/10/2022   Severe persistent asthma    Dr. Sharyn Lull    Past Surgical History:  Procedure Laterality Date   TONSILLECTOMY  06/03/2018    Family Psychiatric History:  Denies  Family History:  Family History  Problem Relation Age of Onset   Heart attack Other    Prostate cancer Other    Hypertension Father    Hypertension Maternal Grandfather    Cancer Maternal Grandfather    Asthma Neg Hx     Social History:  Social History   Socioeconomic History   Marital status: Single    Spouse name: Not on file   Number of children: Not on file   Years of education: Not on file   Highest education level: Not on file  Occupational History   Not on file  Tobacco Use   Smoking status: Every Day    Current packs/day: 0.25    Types: Cigarettes    Passive exposure: Past   Smokeless tobacco: Never  Vaping Use   Vaping status: Never Used  Substance and Sexual Activity   Alcohol use: Yes    Comment: Rare   Drug use: No   Sexual activity: Yes  Other Topics Concern   Not on file  Social History Narrative   Single, lives with mom in [place].   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   No T/A/Ds.   Social Determinants of Health   Financial Resource Strain: Low Risk  (02/24/2023)   Received from [facility]   Overall Financial Resource Strain (CARDIA)    Difficulty of Paying Living Expenses: Not hard at all  Food Insecurity: No Food Insecurity (02/24/2023)   Received from [facility]   Hunger Vital Sign    Worried About Running Out of Food in the Last Year: Never true    Ran Out of Food in the Last Year: Never true  Transportation Needs: No Transportation Needs (02/24/2023)   Received from [facility] - Transportation    Lack of Transportation (Medical): No    Lack of Transportation (Non-Medical): No  Physical Activity: Inactive (01/12/2023)   Received from [facility]   Exercise Vital Sign    Days  of Exercise per Week: 0 days    Minutes of Exercise per Session: 30 min  Stress: No Stress Concern Present (01/12/2023)   Received from [facility] - Occupational Stress Questionnaire    Feeling of Stress : Not at all  Social Connections: Socially Integrated (01/12/2023)   Received from [facility]   Social Network    How would you rate your social network (family, work, friends)?: Good participation with social networks    Allergies:  Allergies  Allergen Reactions   Amoxicillin Shortness Of Breath, Swelling and Other (See Comments)    Metabolic Disorder Labs: Lab Results  Component Value Date   HGBA1C 5.5 03/14/2020   MPG 111.15 03/14/2020   MPG 114 08/27/2014   No results found for: "PROLACTIN" Lab  Results  Component Value Date   CHOL 180 03/14/2020   TRIG 136 03/14/2020   HDL 33 (L) 03/14/2020   CHOLHDL 5.5 03/14/2020   VLDL 27 03/14/2020   LDLCALC 120 (H) 03/14/2020   LDLCALC 92 02/28/2019   Lab Results  Component Value Date   TSH 2.519 03/15/2020   TSH 5.616 (H) 03/14/2020    Therapeutic Level Labs: No results found for: "LITHIUM" No results found for: "VALPROATE" No results found for: "CBMZ"  Current Medications: Current Outpatient Medications  Medication Sig Dispense Refill   buPROPion (WELLBUTRIN XL) 150 MG 24 hr tablet Take 1 tablet (150 mg total) by mouth every morning. 30 tablet 3   albuterol (VENTOLIN HFA) 108 (90 Base) MCG/ACT inhaler Inhale 2 puffs into the lungs every 4 (four) hours as needed for wheezing or shortness of breath. (Patient taking differently: Inhale 1-2 puffs into the lungs every 4 (four) hours as needed for wheezing or shortness of breath.) 18 each 1   apixaban (ELIQUIS) 5 MG TABS tablet Take 5 mg by mouth 2 (two) times daily.     atomoxetine (STRATTERA) 80 MG capsule Take 1 capsule (80 mg total) by mouth daily. 30 capsule 3   Budeson-Glycopyrrol-Formoterol (BREZTRI AEROSPHERE) 160-9-4.8  MCG/ACT AERO INHALE 2 PUFFS INTO THE LUNGS TWICE A DAY 10.7 g 1   cetirizine (ZYRTEC) 10 MG tablet Take 1 tablet (10 mg total) by mouth daily. 90 tablet 1   Cholecalciferol (VITAMIN D-3) 125 MCG (5000 UT) TABS Take 5,000 Units by mouth daily.     doxazosin (CARDURA) 1 MG tablet Take 1 mg by mouth daily.     lisinopril (ZESTRIL) 20 MG tablet Take 20 mg by mouth daily.     metoprolol succinate (TOPROL-XL) 50 MG 24 hr tablet Take 100 mg by mouth 2 (two) times daily.     Omega-3 1400 MG CAPS Take 1,400 mg by mouth daily.     Paliperidone Palmitate ER (INVEGA HAFYERA) 1092 MG/3.5ML SUSY Inject 1,092 mg into the muscle every 6 (six) months.     tirzepatide (ZEPBOUND) 5 MG/0.5ML Pen Inject into the skin.     traZODone (DESYREL) 100 MG tablet Take 1 tablet (100 mg total) by mouth at bedtime as needed. for sleep 90 tablet 2   No current facility-administered medications for this visit.     Musculoskeletal: Strength & Muscle Tone: within normal limits and telehealth visit Gait & Station: normal, telehealth visit Patient leans: N/A  Psychiatric Specialty Exam: Review of Systems  There were no vitals taken for this visit.There is no height or weight on file to calculate BMI.  General Appearance: Well Groomed  Eye Contact:  Good  Speech:  Clear and Coherent and Normal Rate  Volume:  Normal  Mood:  Depressed, mild  Affect:  Appropriate and Congruent  Thought Process:  Coherent, Goal Directed, and Linear  Orientation:  Full (Time, Place, and Person)  Thought Content: WDL and Logical   Suicidal Thoughts:  No  Homicidal Thoughts:  No  Memory:  Immediate;   Good Recent;   Good Remote;   Good  Judgement:  Good  Insight:  Good  Psychomotor Activity:  Normal  Concentration:  Concentration: Good and Attention Span: Good  Recall:  Good  Fund of Knowledge: Good  Language: Good  Akathisia:  No  Handed:  Right  AIMS (if indicated): not done  Assets:  Communication Skills Desire for  Improvement Financial Resources/Insurance Housing Physical Health Social Support  ADL's:  Intact  Cognition:  WNL  Sleep:  Good   Screenings: AIMS    Flowsheet Row Office Visit from 08/27/2022 in [facility] Office Visit from 05/28/2022 in [facility] Office Visit from 02/26/2022 in [facility] Admission (Discharged) [facility]  AIMS Total Score 4 0 1 0      AUDIT    Flowsheet Row Admission (Discharged) from [facility]  Alcohol Use Disorder Identification Test Final Score (AUDIT) 1      GAD-7    Flowsheet Row Video Visit from 02/26/2023 in [facility] Video Visit from 01/07/2023 in [facility] Video Visit from 10/22/2022 in [facility] Video Visit from 09/15/2022 in [facility] Office Visit from 08/27/2022 in [facility]  Total GAD-7 Score 7 2 5 7 5       PHQ2-9    Flowsheet Row Video Visit from 02/26/2023 in [facility] Video Visit from 01/07/2023 in [facility] Video Visit from 10/22/2022 in [facility] ED from 10/08/2022 in [facility] Emergency Department at [facility] Video Visit from 09/15/2022 in [facility]  PHQ-2 Total Score 4 0 1 2 2   PHQ-9 Total Score 11 4 4 11 8       Flowsheet Row Video Visit from 10/22/2022 in [facility] Admission (Discharged) from 10/09/2022 in [facility] ED from 10/08/2022 in [facility] Emergency Department at [facility]  C-SSRS RISK CATEGORY Error: Q3, 4, or 5 should not be populated when Q2 is No No Risk Moderate Risk         Assessment and Plan: Patient informed writer that his depression and anxiety has increased since his last visit.  He also endorses symptoms of ADHD.   He notes that Strattera was not as effective as he would like it to be.  Today patient agreeable to starting Wellbutrin XL 150 mg to help manage depression and concentration.  Strattera also increased from 40 mg to 80 mg to help manage symptoms of ADHD.  He will continue other medications as prescribed.    1. Attention deficit hyperactivity disorder (ADHD), predominantly inattentive type  Increased- atomoxetine (STRATTERA) 80 MG capsule; Take 1 capsule (80 mg total) by mouth daily.  Dispense: 30 capsule; Refill: 3 Start- buPROPion (WELLBUTRIN XL) 150 MG 24 hr tablet; Take 1 tablet (150 mg total) by mouth every morning.  Dispense: 30 tablet; Refill: 3  2. Schizoaffective disorder, bipolar type (HCC)  Continue- traZODone (DESYREL) 100 MG tablet; Take 1 tablet (100 mg total) by mouth at bedtime as needed. for sleep  Dispense: 90 tablet; Refill: 2 Continue- Paliperidone Palmitate ER (INVEGA HAFYERA) 1092 MG/3.5ML SUSY; Inject 1,092 mg into the muscle every 6 (six) months.      Follow-up in 3 months Follow-up therapy Shanna Cisco, NP 02/26/2023, 12:06 PM

## 2023-03-01 MED ORDER — PALIPERIDONE PALMITATE ER 1560 MG/5ML IM SUSY
1560.0000 mg | PREFILLED_SYRINGE | Freq: Once | INTRAMUSCULAR | Status: DC
Start: 2023-03-01 — End: 2023-03-01

## 2023-03-03 MED ORDER — PALIPERIDONE PALMITATE ER 1560 MG/5ML IM SUSY
1560.0000 mg | PREFILLED_SYRINGE | Freq: Once | INTRAMUSCULAR | Status: AC
Start: 2023-03-03 — End: 2023-02-25
  Administered 2023-02-25: 1560 mg via INTRAMUSCULAR

## 2023-03-20 ENCOUNTER — Other Ambulatory Visit (HOSPITAL_COMMUNITY): Payer: Self-pay | Admitting: Psychiatry

## 2023-03-20 DIAGNOSIS — F900 Attention-deficit hyperactivity disorder, predominantly inattentive type: Secondary | ICD-10-CM

## 2023-03-23 ENCOUNTER — Telehealth (HOSPITAL_COMMUNITY): Payer: Self-pay | Admitting: Psychiatry

## 2023-03-23 ENCOUNTER — Other Ambulatory Visit (HOSPITAL_COMMUNITY): Payer: Self-pay | Admitting: Psychiatry

## 2023-03-23 DIAGNOSIS — F900 Attention-deficit hyperactivity disorder, predominantly inattentive type: Secondary | ICD-10-CM

## 2023-03-23 MED ORDER — ATOMOXETINE HCL 80 MG PO CAPS: 80.0000 mg | ORAL_CAPSULE | Freq: Every day | ORAL | 3 refills | Status: DC

## 2023-03-23 NOTE — Telephone Encounter (Signed)
There is not a [facility] [address].  Provider called the patient and was informed that he would like to be sent to [facility] on [address].  Medication sent to preferred pharmacy.

## 2023-03-30 ENCOUNTER — Telehealth (HOSPITAL_COMMUNITY): Payer: No Typology Code available for payment source | Admitting: Psychiatry

## 2023-05-14 ENCOUNTER — Encounter (HOSPITAL_COMMUNITY): Payer: Self-pay | Admitting: Psychiatry

## 2023-05-14 ENCOUNTER — Telehealth (HOSPITAL_COMMUNITY): Payer: No Typology Code available for payment source | Admitting: Psychiatry

## 2023-05-14 DIAGNOSIS — F900 Attention-deficit hyperactivity disorder, predominantly inattentive type: Secondary | ICD-10-CM | POA: Diagnosis not present

## 2023-05-14 DIAGNOSIS — F250 Schizoaffective disorder, bipolar type: Secondary | ICD-10-CM | POA: Diagnosis not present

## 2023-05-14 MED ORDER — INVEGA HAFYERA 1092 MG/3.5ML IM SUSY
1092.0000 mg | PREFILLED_SYRINGE | INTRAMUSCULAR | Status: DC
Start: 2023-05-14 — End: 2023-07-16

## 2023-05-14 MED ORDER — TRAZODONE HCL 100 MG PO TABS
100.0000 mg | ORAL_TABLET | Freq: Every evening | ORAL | 2 refills | Status: DC | PRN
Start: 2023-05-14 — End: 2023-07-16

## 2023-05-14 MED ORDER — ATOMOXETINE HCL 80 MG PO CAPS: 80.0000 mg | ORAL_CAPSULE | Freq: Every day | ORAL | 3 refills | Status: DC

## 2023-05-14 NOTE — Progress Notes (Signed)
 BH MD/PA/NP OP Progress Note Virtual Visit via Video Note  I connected with Nathaniel [name] on 05/14/23 at 10:30 AM EST by a video enabled telemedicine application and verified that I am speaking with the correct person using two identifiers.  Location: Patient: Home Provider: Clinic   I discussed the limitations of evaluation and management by telemedicine and the availability of in person appointments. The patient expressed understanding and agreed to proceed.  I provided 30 minutes of non-face-to-face time during this encounter.     05/14/2023 11:06 AM Nathaniel [name]  MRN:  [id number]  Chief Complaint: I stopped the Wellbutrin  because it disturbed my sleep  HPI:  23 year old male seen today for follow-up psychiatric evaluation. He has a psychiatric history of substance use, tobacco dependence, schizoaffective disorder bipolar type, and anxiety.  He is currently managed on Invega  Hafyera 1092 every 6 months, Strattera  80 mg daily, Wellbutrin  XL 150 mg daily, and trazodone  100 mg nightly. He reports he discontinued Wellbutrin . He reports that his other medications are effective in managing his psychiatric condition.  Today he is well-groomed, pleasant, cooperative, engaged in conversation, maintaining eye contact.  He informed clinical research associate that he discontinued Wellbutrin  because it caused him to have insomnia and notes that it did not help with his depression. Patient does note that he now feels mentally stable. He reports that he has a lot of new and exciting things going on in his life. He informed clinical research associate that he will be completing his associates degree at [employer] on Monday and has an upcoming interview. 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   Since his last visit he notes that his anxiety and depression has improved.  Today provider conducted a GAD-7 and patient scored a 0, at his last visit he scored a 7.  Provider also conducted PHQ-9 the patient  scored an 1, at his last visit he scored a 11.  He endorses adequate sleep and appetite. He notes that he is currently on Zepbound and has lost 8 pounds.  Today he denies SI/HI/AVH, mania, or paranoia.  Since increasing Strattera  her reports that he has been more focused. He informed clinical research associate that he passed his last semester. At this time Wellbutrin  not restarted. Patient will continue all other medications as prescribed. No other concerns at this time.  noted at this time.   Visit Diagnosis:    ICD-10-CM   1. Attention deficit hyperactivity disorder (ADHD), predominantly inattentive type  F90.0 atomoxetine  (STRATTERA ) 80 MG capsule    2. Schizoaffective disorder, bipolar type (HCC)  F25.0 traZODone  (DESYREL ) 100 MG tablet    Paliperidone  Palmitate ER (INVEGA  HAFYERA) 1092 MG/3.5ML SUSY         Past Psychiatric History:  substance use, tobacco dependence, schizoaffective disorder bipolar type, and anxiety  Past Medical History:  Past Medical History:  Diagnosis Date   Allergic rhinitis    Chronic tonsillar hypertrophy    ENT did tonsillectomy 2020   Elevated blood pressure reading without diagnosis of hypertension    IFG (impaired fasting glucose) 12/2017   Gluc 107, HbA1c 5.5%   Obesity, Class II, BMI 35-39.9    OSA (obstructive sleep apnea) 02/2018   Sleep study recommended by Dr. Sylvan considering it.   Pulmonary embolism (HCC) 09/10/2022   Severe persistent asthma    Dr. Tiana    Past Surgical History:  Procedure Laterality Date   TONSILLECTOMY  06/03/2018    Family Psychiatric History:  Denies  Family History:  Family History  Problem Relation Age of Onset   Heart attack Other    Prostate cancer Other    Hypertension Father    Hypertension Maternal Grandfather    Cancer Maternal Grandfather    Asthma Neg Hx     Social History:  Social History   Socioeconomic History   Marital status: Single    Spouse name: Not on file   Number of children: Not on file    Years of education: Not on file   Highest education level: Not on file  Occupational History   Not on file  Tobacco Use   Smoking status: Every [name]    Current packs/[name]: 0.25    Types: Cigarettes    Passive exposure: Past   Smokeless tobacco: Never  Vaping Use   Vaping status: Never Used  Substance and Sexual Activity   Alcohol use: Yes    Comment: Rare   Drug use: No   Sexual activity: Yes  Other Topics Concern   Not on file  Social History Narrative   Single, lives with mom in [place].   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   No T/A/Ds.   Social Drivers of Corporate Investment Banker Strain: Low Risk  (05/03/2023)   Received from [facility]   Overall Financial Resource Strain (CARDIA)    Difficulty of Paying Living Expenses: Not hard at all  Food Insecurity: No Food Insecurity (05/03/2023)   Received from [facility]   Hunger Vital Sign    Worried About Running Out of Food in the Last Year: Never true    Ran Out of Food in the Last Year: Never true  Transportation Needs: No Transportation Needs (05/03/2023)   Received from [facility] - Transportation    Lack of Transportation (Medical): No    Lack of Transportation (Non-Medical): No  Physical Activity: Insufficiently Active (05/03/2023)   Received from [facility]   Exercise Vital Sign    Days of Exercise per Week: 2 days    Minutes of Exercise per Session: 30 min  Stress: No Stress Concern Present (05/03/2023)   Received from [facility] - Occupational Stress Questionnaire    Feeling of Stress : Not at all  Social Connections: Socially Integrated (05/03/2023)   Received from [facility]   Social Network    How would you rate your social network (family, work, friends)?: Good participation with social networks    Allergies:  Allergies  Allergen Reactions   Amoxicillin Shortness Of Breath, Swelling and  Other (See Comments)    Metabolic Disorder Labs: Lab Results  Component Value Date   HGBA1C 5.5 03/14/2020   MPG 111.15 03/14/2020   MPG 114 08/27/2014   No results found for: PROLACTIN Lab Results  Component Value Date   CHOL 180 03/14/2020   TRIG 136 03/14/2020   HDL 33 (L) 03/14/2020   CHOLHDL 5.5 03/14/2020   VLDL 27 03/14/2020   LDLCALC 120 (H) 03/14/2020   LDLCALC 92 02/28/2019   Lab Results  Component Value Date   TSH 2.519 03/15/2020   TSH 5.616 (H) 03/14/2020    Therapeutic Level Labs: No results found for: LITHIUM No results found for: VALPROATE No results found for: CBMZ  Current Medications: Current Outpatient Medications  Medication Sig Dispense Refill   albuterol  (VENTOLIN  HFA) 108 (90 Base) MCG/ACT inhaler Inhale 2 puffs into the lungs every  4 (four) hours as needed for wheezing or shortness of breath. (Patient taking differently: Inhale 1-2 puffs into the lungs every 4 (four) hours as needed for wheezing or shortness of breath.) 18 each 1   apixaban  (ELIQUIS ) 5 MG TABS tablet Take 5 mg by mouth 2 (two) times daily.     atomoxetine  (STRATTERA ) 80 MG capsule Take 1 capsule (80 mg total) by mouth daily. 90 capsule 3   Budeson-Glycopyrrol-Formoterol  (BREZTRI  AEROSPHERE) 160-9-4.8 MCG/ACT AERO INHALE 2 PUFFS INTO THE LUNGS TWICE A [name] 10.7 g 1   cetirizine  (ZYRTEC ) 10 MG tablet Take 1 tablet (10 mg total) by mouth daily. 90 tablet 1   Cholecalciferol  (VITAMIN D-3) 125 MCG (5000 UT) TABS Take 5,000 Units by mouth daily.     doxazosin  (CARDURA ) 1 MG tablet Take 1 mg by mouth daily.     lisinopril  (ZESTRIL ) 20 MG tablet Take 20 mg by mouth daily.     metoprolol  succinate (TOPROL -XL) 50 MG 24 hr tablet Take 100 mg by mouth 2 (two) times daily.     Omega-3 1400 MG CAPS Take 1,400 mg by mouth daily.     Paliperidone  Palmitate ER (INVEGA  HAFYERA) 1092 MG/3.5ML SUSY Inject 1,092 mg into the muscle every 6 (six) months.     tirzepatide (ZEPBOUND) 5 MG/0.5ML Pen  Inject into the skin.     traZODone  (DESYREL ) 100 MG tablet Take 1 tablet (100 mg total) by mouth at bedtime as needed. for sleep 90 tablet 2   No current facility-administered medications for this visit.     Musculoskeletal: Strength & Muscle Tone: within normal limits and telehealth visit Gait & Station: normal, telehealth visit Patient leans: N/A  Psychiatric Specialty Exam: Review of Systems  There were no vitals taken for this visit.There is no height or weight on file to calculate BMI.  General Appearance: Well Groomed  Eye Contact:  Good  Speech:  Clear and Coherent and Normal Rate  Volume:  Normal  Mood:  Euthymic  Affect:  Appropriate and Congruent  Thought Process:  Coherent, Goal Directed, and Linear  Orientation:  Full (Time, Place, and Person)  Thought Content: WDL and Logical   Suicidal Thoughts:  No  Homicidal Thoughts:  No  Memory:  Immediate;   Good Recent;   Good Remote;   Good  Judgement:  Good  Insight:  Good  Psychomotor Activity:  Normal  Concentration:  Concentration: Good and Attention Span: Good  Recall:  Good  Fund of Knowledge: Good  Language: Good  Akathisia:  No  Handed:  Right  AIMS (if indicated): not done  Assets:  Communication Skills Desire for Improvement Financial Resources/Insurance Housing Physical Health Social Support  ADL's:  Intact  Cognition: WNL  Sleep:  Good   Screenings: AIMS    Flowsheet Row Office Visit from 08/27/2022 in [facility] Office Visit from 05/28/2022 in [facility] Office Visit from 02/26/2022 in [facility] Admission (Discharged[facility]  AIMS Total Score 4 0 1 0      AUDIT    Flowsheet Row Admission (Discharged) from [facility]  Alcohol Use Disorder Identification Test Final Score  (AUDIT) 1      GAD-7    Flowsheet Row Video Visit from 05/14/2023 in [facility] Video Visit from 02/26/2023 in [facility] Video Visit  from 01/07/2023 in [facility] Video Visit from 10/22/2022 in [facility] Video Visit from 09/15/2022 in [facility]  Total GAD-7 Score 0 7 2 5 7       PHQ2-9    Flowsheet Row Video Visit from 05/14/2023 in [facility] Video Visit from 02/26/2023 in [facility] Video Visit from 01/07/2023 in [facility] Video Visit from 10/22/2022 in [facility] ED from 10/08/2022 in [facility] Emergency Department at [facility]  PHQ-2 Total Score 0 4 0 1 2  PHQ-9 Total Score 1 11 4 4 11       Flowsheet Row Video Visit from 10/22/2022 in [facility] Admission (Discharged) from 10/09/2022 in [facility] ED from 10/08/2022 in [facility] Emergency Department at [facility]  C-SSRS RISK CATEGORY Error: Q3, 4, or 5 should not be populated when Q2 is No No Risk Moderate Risk        Assessment and Plan: Patient informed writer that  Wellbutrin  caused insomnia. Since increasing Strattera  her reports that he has been more focused. He informed clinical research associate that he passed his last semester. At this time Wellbutrin  not restarted. Patient will continue all other medications as prescribed.  1. Attention deficit hyperactivity disorder (ADHD), predominantly inattentive type  Continue- atomoxetine  (STRATTERA ) 80 MG capsule; Take 1 capsule (80 mg total) by mouth daily.  Dispense: 90 capsule; Refill: 3  2. Schizoaffective disorder, bipolar type (HCC)  Continue- traZODone  (DESYREL ) 100 MG tablet; Take 1 tablet (100 mg total) by mouth at bedtime as needed. for sleep  Dispense: 90 tablet;  Refill: 2 Continue- Paliperidone  Palmitate ER (INVEGA  HAFYERA) 1092 MG/3.5ML SUSY; Inject 1,092 mg into the muscle every 6 (six) months.      Follow-up in 3 months Follow-up therapy Zane FORBES Bach, NP 05/14/2023, 11:06 AM

## 2023-06-04 ENCOUNTER — Telehealth (HOSPITAL_COMMUNITY): Payer: Self-pay | Admitting: *Deleted

## 2023-06-04 NOTE — Telephone Encounter (Signed)
Fax received from [facility] Laural Benes stating that Patient Assistance for Nathaniel [name] has been approved for 1 year beginning 06/03/23.

## 2023-07-16 ENCOUNTER — Encounter (HOSPITAL_COMMUNITY): Payer: Self-pay | Admitting: Psychiatry

## 2023-07-16 ENCOUNTER — Telehealth (HOSPITAL_COMMUNITY): Payer: No Typology Code available for payment source | Admitting: Psychiatry

## 2023-07-16 DIAGNOSIS — F900 Attention-deficit hyperactivity disorder, predominantly inattentive type: Secondary | ICD-10-CM | POA: Diagnosis not present

## 2023-07-16 DIAGNOSIS — F250 Schizoaffective disorder, bipolar type: Secondary | ICD-10-CM | POA: Diagnosis not present

## 2023-07-16 MED ORDER — INVEGA HAFYERA 1092 MG/3.5ML IM SUSY: 1092.0000 mg | PREFILLED_SYRINGE | INTRAMUSCULAR | Status: DC

## 2023-07-16 MED ORDER — TRAZODONE HCL 100 MG PO TABS: 100.0000 mg | ORAL_TABLET | Freq: Every evening | ORAL | 2 refills | Status: DC | PRN

## 2023-07-16 MED ORDER — ATOMOXETINE HCL 80 MG PO CAPS: 80.0000 mg | ORAL_CAPSULE | Freq: Every day | ORAL | 3 refills | Status: DC

## 2023-07-16 NOTE — Progress Notes (Signed)
 BH MD/PA/NP OP Progress Note Virtual Visit via Video Note  I connected with Nathaniel [name] on 07/16/23 at 10:00 AM EDT by a video enabled telemedicine application and verified that I am speaking with the correct person using two identifiers.  Location: Patient: Home Provider: Clinic   I discussed the limitations of evaluation and management by telemedicine and the availability of in person appointments. The patient expressed understanding and agreed to proceed.  I provided 30 minutes of non-face-to-face time during this encounter.     07/16/2023 10:00 AM Nathaniel [name]  MRN:  [id number]  Chief Complaint: "I have been doing the same things"  HPI:  23 year old male seen today for follow-up psychiatric evaluation. He has a psychiatric history of substance use, tobacco dependence, schizoaffective disorder bipolar type, and anxiety.  He is currently managed on Invega Hafyera 1092 every 6 months, Strattera 80 mg daily, and trazodone 100 mg nightly.  He reports that his other medications are effective in managing his psychiatric condition.  Today he is well-groomed, pleasant, cooperative, engaged in conversation, maintaining eye contact.  He informed Clinical research associate that he has been doing the same things. He notes that he stays busy with school and then come home and relaxes.  Patient notes that his mood, anxiety, and depression continues to be well-managed.  Provider conducted a GAD-7 and patient scored a 0, at his last visit he scored a 0.  Provider also conducted PHQ-9 the patient scored a 0, at his last visit he scored a 1.  He endorses adequate sleep and appetite.  Today he denies SI/HI/AVH, mania, paranoia.   No medication changes made today. Patient does not have recent labs.  Today provider ordered CBC, CMP, LFT, thyroid panel, lipid panel, Hgb A1c,and prolactin level.  No other concerns at this time.  noted at this time.   Visit Diagnosis:    ICD-10-CM   1. Attention deficit hyperactivity disorder  (ADHD), predominantly inattentive type  F90.0 atomoxetine (STRATTERA) 80 MG capsule    2. Schizoaffective disorder, bipolar type (HCC)  F25.0 traZODone (DESYREL) 100 MG tablet    paliperidone Palmitate ER (INVEGA HAFYERA) 1092 MG/3.5ML SUSY    CBC w/Diff/Platelet    Comprehensive Metabolic Panel (CMET)    Thyroid Panel With TSH    Hepatic function panel    Lipid panel    Prolactin    HgB A1c          Past Psychiatric History:  substance use, tobacco dependence, schizoaffective disorder bipolar type, and anxiety  Past Medical History:  Past Medical History:  Diagnosis Date   Allergic rhinitis    Chronic tonsillar hypertrophy    ENT did tonsillectomy 2020   Elevated blood pressure reading without diagnosis of hypertension    IFG (impaired fasting glucose) 12/2017   Gluc 107, HbA1c 5.5%   Obesity, Class II, BMI 35-39.9    OSA (obstructive sleep apnea) 02/2018   Sleep study recommended by Dr. Ivory Broad considering it.   Pulmonary embolism (HCC) 09/10/2022   Severe persistent asthma    Dr. Sharyn Lull    Past Surgical History:  Procedure Laterality Date   TONSILLECTOMY  06/03/2018    Family Psychiatric History:  Denies  Family History:  Family History  Problem Relation Age of Onset   Heart attack Other    Prostate cancer Other    Hypertension Father    Hypertension Maternal Grandfather    Cancer Maternal Grandfather    Asthma Neg Hx     Social History:  Social History  Socioeconomic History   Marital status: Single    Spouse name: Not on file   Number of children: Not on file   Years of education: Not on file   Highest education level: Not on file  Occupational History   Not on file  Tobacco Use   Smoking status: Every Day    Current packs/day: 0.25    Types: Cigarettes    Passive exposure: Past   Smokeless tobacco: Never  Vaping Use   Vaping status: Never Used  Substance and Sexual Activity   Alcohol use: Yes    Comment: Rare   Drug use: No   Sexual  activity: Yes  Other Topics Concern   Not on file  Social History Narrative   Single, lives with mom in [place].   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   No T/A/Ds.   Social Drivers of Corporate investment banker Strain: Low Risk  (05/03/2023)   Received from [facility]   Overall Financial Resource Strain (CARDIA)    Difficulty of Paying Living Expenses: Not hard at all  Food Insecurity: No Food Insecurity (05/03/2023)   Received from [facility]   Hunger Vital Sign    Worried About Running Out of Food in the Last Year: Never true    Ran Out of Food in the Last Year: Never true  Transportation Needs: No Transportation Needs (05/03/2023)   Received from [facility] - Transportation    Lack of Transportation (Medical): No    Lack of Transportation (Non-Medical): No  Physical Activity: Insufficiently Active (05/03/2023)   Received from [facility]   Exercise Vital Sign    Days of Exercise per Week: 2 days    Minutes of Exercise per Session: 30 min  Stress: No Stress Concern Present (05/03/2023)   Received from [facility] - Occupational Stress Questionnaire    Feeling of Stress : Not at all  Social Connections: Socially Integrated (05/03/2023)   Received from [facility]   Social Network    How would you rate your social network (family, work, friends)?: Good participation with social networks    Allergies:  Allergies  Allergen Reactions   Amoxicillin Shortness Of Breath, Swelling and Other (See Comments)    Metabolic Disorder Labs: Lab Results  Component Value Date   HGBA1C 5.5 03/14/2020   MPG 111.15 03/14/2020   MPG 114 08/27/2014   No results found for: "PROLACTIN" Lab Results  Component Value Date   CHOL 180 03/14/2020   TRIG 136 03/14/2020   HDL 33 (L) 03/14/2020   CHOLHDL 5.5 03/14/2020   VLDL 27 03/14/2020   LDLCALC 120 (H) 03/14/2020   LDLCALC 92  02/28/2019   Lab Results  Component Value Date   TSH 2.519 03/15/2020   TSH 5.616 (H) 03/14/2020    Therapeutic Level Labs: No results found for: "LITHIUM" No results found for: "VALPROATE" No results found for: "CBMZ"  Current Medications: Current Outpatient Medications  Medication Sig Dispense Refill   albuterol (VENTOLIN HFA) 108 (90 Base) MCG/ACT inhaler Inhale 2 puffs into the lungs every 4 (four) hours as needed for wheezing or shortness of breath. (Patient taking differently: Inhale 1-2 puffs into the lungs every 4 (four) hours as needed for wheezing or shortness of breath.) 18 each 1   apixaban (ELIQUIS) 5 MG TABS tablet Take 5 mg by mouth 2 (two) times daily.  atomoxetine (STRATTERA) 80 MG capsule Take 1 capsule (80 mg total) by mouth daily. 90 capsule 3   Budeson-Glycopyrrol-Formoterol (BREZTRI AEROSPHERE) 160-9-4.8 MCG/ACT AERO INHALE 2 PUFFS INTO THE LUNGS TWICE A DAY 10.7 g 1   cetirizine (ZYRTEC) 10 MG tablet Take 1 tablet (10 mg total) by mouth daily. 90 tablet 1   Cholecalciferol (VITAMIN D-3) 125 MCG (5000 UT) TABS Take 5,000 Units by mouth daily.     doxazosin (CARDURA) 1 MG tablet Take 1 mg by mouth daily.     lisinopril (ZESTRIL) 20 MG tablet Take 20 mg by mouth daily.     metoprolol succinate (TOPROL-XL) 50 MG 24 hr tablet Take 100 mg by mouth 2 (two) times daily.     Omega-3 1400 MG CAPS Take 1,400 mg by mouth daily.     paliperidone Palmitate ER (INVEGA HAFYERA) 1092 MG/3.5ML SUSY Inject 3.5 mLs (1,092 mg total) into the muscle every 6 (six) months.     tirzepatide (ZEPBOUND) 5 MG/0.5ML Pen Inject into the skin.     traZODone (DESYREL) 100 MG tablet Take 1 tablet (100 mg total) by mouth at bedtime as needed. for sleep 90 tablet 2   No current facility-administered medications for this visit.     Musculoskeletal: Strength & Muscle Tone: within normal limits and telehealth visit Gait & Station: normal, telehealth visit Patient leans: N/A  Psychiatric  Specialty Exam: Review of Systems  There were no vitals taken for this visit.There is no height or weight on file to calculate BMI.  General Appearance: Well Groomed  Eye Contact:  Good  Speech:  Clear and Coherent and Normal Rate  Volume:  Normal  Mood:  Euthymic  Affect:  Appropriate and Congruent  Thought Process:  Coherent, Goal Directed, and Linear  Orientation:  Full (Time, Place, and Person)  Thought Content: WDL and Logical   Suicidal Thoughts:  No  Homicidal Thoughts:  No  Memory:  Immediate;   Good Recent;   Good Remote;   Good  Judgement:  Good  Insight:  Good  Psychomotor Activity:  Normal  Concentration:  Concentration: Good and Attention Span: Good  Recall:  Good  Fund of Knowledge: Good  Language: Good  Akathisia:  No  Handed:  Right  AIMS (if indicated): not done  Assets:  Communication Skills Desire for Improvement Financial Resources/Insurance Housing Physical Health Social Support  ADL's:  Intact  Cognition: WNL  Sleep:  Good   Screenings: AIMS    Flowsheet Row Office Visit from 08/27/2022 in [facility] Office Visit from 05/28/2022 in [facility] Office Visit from 02/26/2022 in [facility] Admission (Discharged) from [facility]  AIMS Total Score 4 0 1 0      AUDIT    Flowsheet Row Admission (Discharged) from [facility]  Alcohol Use Disorder Identification Test Final Score (AUDIT) 1      GAD-7    Flowsheet Row Video Visit from 07/16/2023 in [facility] Video Visit from 05/14/2023 in [facility] Video Visit from 02/26/2023 in [facility] Video Visit from 01/07/2023 in [facility] Video Visit from 10/22/2022 in [facility]  Total GAD-7 Score 0 0 7 2 5       PHQ2-9    Flowsheet Row Video Visit from 07/16/2023 in  [facility] Video Visit from 05/14/2023 in [facility] Video Visit from 02/26/2023 in [facility] Video Visit from 01/07/2023 in [facility] Video Visit from 10/22/2022 in [facility]  PHQ-2 Total Score 0 0 4 0 1  PHQ-9 Total Score 0 1 11 4 4       Flowsheet Row Video Visit from 10/22/2022 in [facility] Admission (Discharged) from 10/09/2022 in [facility] ED from 10/08/2022 in [facility] Emergency Department at [facility]  C-SSRS RISK CATEGORY Error: Q3, 4, or 5 should not be populated when Q2 is No No Risk Moderate Risk        Assessment and Plan: Patient informed that he is doing well on his current medication regimen. No medication changes made today. Patient does not have recent labs.  Today provider ordered CBC, CMP, LFT, thyroid panel, lipid panel, Hgb A1c,and prolactin level.   1. Attention deficit hyperactivity disorder (ADHD), predominantly inattentive type  Continue- atomoxetine (STRATTERA) 80 MG capsule; Take 1 capsule (80 mg total) by mouth daily.  Dispense: 90 capsule; Refill: 3  2. Schizoaffective disorder, bipolar type (HCC)  Continue- traZODone (DESYREL) 100 MG tablet; Take 1 tablet (100 mg total) by mouth at bedtime as needed. for sleep  Dispense: 90 tablet; Refill: 2 Continue- paliperidone Palmitate ER (INVEGA HAFYERA) 1092 MG/3.5ML SUSY; Inject 3.5 mLs (1,092 mg total) into the muscle every 6 (six) months. - CBC w/Diff/Platelet; Future - Comprehensive Metabolic Panel (CMET); Future - Thyroid Panel With TSH; Future - Hepatic function panel - Lipid panel; Future - Prolactin; Future - HgB A1c; Future     Follow-up in 3 months Follow-up therapy Shanna Cisco, NP 07/16/2023, 10:00 AM

## 2023-07-19 ENCOUNTER — Other Ambulatory Visit (INDEPENDENT_AMBULATORY_CARE_PROVIDER_SITE_OTHER)

## 2023-07-19 DIAGNOSIS — Z79899 Other long term (current) drug therapy: Secondary | ICD-10-CM

## 2023-07-19 DIAGNOSIS — F900 Attention-deficit hyperactivity disorder, predominantly inattentive type: Secondary | ICD-10-CM | POA: Diagnosis not present

## 2023-07-19 NOTE — Progress Notes (Signed)
 Pt tolerated labs well in right arm with no complaints.    JNL CMA

## 2023-07-20 LAB — CBC WITH DIFFERENTIAL/PLATELET
Basophils Absolute: 0.1 10*3/uL (ref 0.0–0.2)
Basos: 1 %
EOS (ABSOLUTE): 0.2 10*3/uL (ref 0.0–0.4)
Eos: 2 %
Hematocrit: 42.2 % (ref 37.5–51.0)
Hemoglobin: 14.5 g/dL (ref 13.0–17.7)
Immature Grans (Abs): 0.1 10*3/uL (ref 0.0–0.1)
Immature Granulocytes: 1 %
Lymphocytes Absolute: 4.6 10*3/uL — ABNORMAL HIGH (ref 0.7–3.1)
Lymphs: 47 %
MCH: 30.6 pg (ref 26.6–33.0)
MCHC: 34.4 g/dL (ref 31.5–35.7)
MCV: 89 fL (ref 79–97)
Monocytes Absolute: 0.6 10*3/uL (ref 0.1–0.9)
Monocytes: 6 %
Neutrophils Absolute: 4.1 10*3/uL (ref 1.4–7.0)
Neutrophils: 43 %
Platelets: 290 10*3/uL (ref 150–450)
RBC: 4.74 x10E6/uL (ref 4.14–5.80)
RDW: 13.2 % (ref 11.6–15.4)
WBC: 9.6 10*3/uL (ref 3.4–10.8)

## 2023-07-20 LAB — COMPREHENSIVE METABOLIC PANEL
ALT: 93 IU/L — ABNORMAL HIGH (ref 0–44)
AST: 42 IU/L — ABNORMAL HIGH (ref 0–40)
Albumin: 4.9 g/dL (ref 4.3–5.2)
Alkaline Phosphatase: 60 IU/L (ref 44–121)
BUN/Creatinine Ratio: 14 (ref 9–20)
BUN: 11 mg/dL (ref 6–20)
Bilirubin Total: 0.3 mg/dL (ref 0.0–1.2)
CO2: 22 mmol/L (ref 20–29)
Calcium: 10 mg/dL (ref 8.7–10.2)
Chloride: 107 mmol/L — ABNORMAL HIGH (ref 96–106)
Creatinine, Ser: 0.8 mg/dL (ref 0.76–1.27)
Globulin, Total: 2.6 g/dL (ref 1.5–4.5)
Glucose: 95 mg/dL (ref 70–99)
Potassium: 4.7 mmol/L (ref 3.5–5.2)
Sodium: 140 mmol/L (ref 134–144)
Total Protein: 7.5 g/dL (ref 6.0–8.5)
eGFR: 127 mL/min/{1.73_m2} (ref >59)

## 2023-07-20 LAB — THYROID PANEL WITH TSH
Free Thyroxine Index: 2.5 (ref 1.2–4.9)
T3 Uptake Ratio: 29 % (ref 24–39)
T4, Total: 8.7 ug/dL (ref 4.5–12.0)
TSH: 1.8 u[IU]/mL (ref 0.450–4.500)

## 2023-07-20 LAB — HEPATIC FUNCTION PANEL: Bilirubin, Direct: 0.13 mg/dL (ref 0.00–0.40)

## 2023-07-20 LAB — PROLACTIN: Prolactin: 34.5 ng/mL — ABNORMAL HIGH (ref 3.6–31.5)

## 2023-07-20 LAB — HEMOGLOBIN A1C
Est. average glucose Bld gHb Est-mCnc: 105 mg/dL
Hgb A1c MFr Bld: 5.3 % (ref 4.8–5.6)

## 2023-07-20 LAB — LIPID PANEL
Chol/HDL Ratio: 5.9 ratio — ABNORMAL HIGH (ref 0.0–5.0)
Cholesterol, Total: 201 mg/dL — ABNORMAL HIGH (ref 100–199)
HDL: 34 mg/dL — ABNORMAL LOW (ref >39)
LDL Chol Calc (NIH): 140 mg/dL — ABNORMAL HIGH (ref 0–99)
Triglycerides: 146 mg/dL (ref 0–149)
VLDL Cholesterol Cal: 27 mg/dL (ref 5–40)

## 2023-07-20 NOTE — Progress Notes (Signed)
 Provider discussed patient current labs.  Patient did not have concerns at this time.  No other concerns at this time.

## 2023-08-24 ENCOUNTER — Ambulatory Visit (INDEPENDENT_AMBULATORY_CARE_PROVIDER_SITE_OTHER): Payer: No Typology Code available for payment source | Admitting: Allergy & Immunology

## 2023-08-24 ENCOUNTER — Other Ambulatory Visit: Payer: Self-pay

## 2023-08-24 VITALS — BP 100/70 | HR 95 | Temp 98.8°F | Resp 20

## 2023-08-24 DIAGNOSIS — J3089 Other allergic rhinitis: Secondary | ICD-10-CM

## 2023-08-24 DIAGNOSIS — J4550 Severe persistent asthma, uncomplicated: Secondary | ICD-10-CM

## 2023-08-24 DIAGNOSIS — F250 Schizoaffective disorder, bipolar type: Secondary | ICD-10-CM | POA: Diagnosis not present

## 2023-08-24 NOTE — Progress Notes (Signed)
 FOLLOW UP  Date of Service/Encounter:  08/24/23   Assessment:   Moderate persistent asthma, uncomplicated - well controlled   Recent PE - on Eliquis     Allergic rhinoconjunctivitis   Complicated past medical history including schizoaffective disorder  Plan/Recommendations:   Assessment and Plan Assessment & Plan      There are no Patient Instructions on file for this visit.   Subjective:   Nathaniel [name] is a 23 y.o. male presenting today for follow up of No chief complaint on file.   Nathaniel [name] has a history of the following: Patient Active Problem List   Diagnosis Date Noted  . Acute pulmonary embolism (HCC) 10/09/2022  . Lower extremity weakness 10/09/2022  . Schizoaffective disorder, depressive type (HCC) 10/08/2022  . Insomnia due to mental disorder 10/08/2022  . Suicidal ideations 10/08/2022  . Attention deficit hyperactivity disorder (ADHD), predominantly inattentive type 02/05/2022  . Schizoaffective disorder, bipolar type (HCC) 11/03/2021  . Generalized anxiety disorder 03/14/2020  . Psychoactive substance-induced psychosis (HCC) 03/13/2020  . Obstructive sleep apnea of adult 03/22/2018  . Hypertrophy, nasal, turbinate 03/22/2018  . Sore throat 07/13/2017  . Hypogonadism male 04/02/2015  . Acquired acanthosis nigricans 11/27/2014  . Pre-diabetes 07/29/2013  . Morbid obesity (HCC) 07/29/2013  . Goiter 07/29/2013  . Dyspepsia 07/29/2013  . Essential hypertension, benign 07/29/2013  . Gynecomastia, male 07/29/2013  . PHARYNGITIS 04/16/2008  . Acute upper respiratory infection 04/16/2008  . INFLUENZA WITH OTHER RESPIRATORY MANIFESTATIONS 02/06/2008  . HERPETIC WHITLOW 12/12/2007  . Herpes simplex virus (HSV) infection 12/12/2007  . MIXED RECEPTIVE-EXPRESSIVE LANGUAGE DISORDER 12/12/2007  . Constipation 12/12/2007  . URTICARIA 12/12/2007  . Fetal and neonatal jaundice 12/12/2007  . CARDIAC FLOW MURMUR 12/12/2007  . Perennial allergic rhinitis  12/09/2007  . Asthma 12/09/2007    History obtained from: chart review and {Persons; PED relatives w/patient:19415::"patient"}.  Discussed the use of AI scribe software for clinical note transcription with the patient and/or guardian, who gave verbal consent to proceed.  Nathaniel [name] is a 23 y.o. male presenting for {Blank single:19197::"a food challenge","a drug challenge","skin testing","a sick visit","an evaluation of ***","a follow up visit"}.  He was last seen in October 2024.  At that time, lung testing looked excellent.  We continue with Breztri  2 puffs twice daily.  For his allergic rhinitis, we will continue with Zyrtec  as well as Flonase .  This schizoaffective disorder was under good control with his new antipsychotic medication.  However, his appetite had increased.  Since last visit, he has done well.  Discussed the use of AI scribe software for clinical note transcription with the patient, who gave verbal consent to proceed.  History of Present Illness     Asthma/Respiratory Symptom History: ***  Allergic Rhinitis Symptom History: ***  Food Allergy Symptom History: ***  Skin Symptom History: ***  GERD Symptom History: ***  Infection Symptom History: ***  Otherwise, there have been no changes to his past medical history, surgical history, family history, or social history.    Review of systems otherwise negative other than that mentioned in the HPI.    Objective:   There were no vitals taken for this visit. There is no height or weight on file to calculate BMI.    Physical Exam   Diagnostic studies:    Spirometry: results normal (FEV1: 3.95/82%, FVC: 4.73/83%, FEV1/FVC: 84%).    Spirometry consistent with normal pattern. {Blank single:19197::"Albuterol /Atrovent  nebulizer","Xopenex/Atrovent  nebulizer","Albuterol  nebulizer","Albuterol  four puffs via MDI","Xopenex four puffs via MDI"} treatment given in clinic with {Blank single:19197::"significant  improvement in  FEV1 per ATS criteria","significant improvement in FVC per ATS criteria","significant improvement in FEV1 and FVC per ATS criteria","improvement in FEV1, but not significant per ATS criteria","improvement in FVC, but not significant per ATS criteria","improvement in FEV1 and FVC, but not significant per ATS criteria","no improvement"}.  Allergy Studies: {Blank single:19197::"none","deferred due to recent antihistamine use","deferred due to insurance stipulations that require a separate visit for testing","labs sent instead"," "}    {Blank single:19197::"Allergy testing results were read and interpreted by myself, documented by clinical staff."," "}      Nathaniel Gentles, MD  [facility] 

## 2023-08-24 NOTE — Patient Instructions (Addendum)
 Moderate persistent asthma, uncomplicated - Lung testing looked excellent today. - I would try to use your Breztri  at least in the morning to keep your symptoms at bay during the day. - Daily controller medication(s): Breztri  two puffs in the morning - Prior to physical activity: albuterol  2 puffs 10-15 minutes before physical activity. - Rescue medications: albuterol  4 puffs every 4-6 hours as needed - Changes during respiratory infections or worsening symptoms: Increase Breztri  to 2 puffs twice daily for TWO WEEKS. - Asthma control goals:  * Full participation in all desired activities (may need albuterol  before activity) * Albuterol  use two time or less a week on average (not counting use with activity) * Cough interfering with sleep two time or less a month * Oral steroids no more than once a year * No hospitalizations  2. Allergic rhinitis - Continue with Zyrtec  once daily.  - Continue with Flonase  one or two sprays per nostril daily.   3. Return in about 6 months (around 02/23/2024). You can have the follow up appointment with Dr. Idolina Maker or a Nurse Practicioner (our Nurse Practitioners are excellent and always have Physician oversight!).    Please inform us  of any Emergency Department visits, hospitalizations, or changes in symptoms. Call us  before going to the ED for breathing or allergy symptoms since we might be able to fit you in for a sick visit. Feel free to contact us  anytime with any questions, problems, or concerns.  It was a pleasure to see you again today!   Websites that have reliable patient information: 1. American Academy of Asthma, Allergy, and Immunology: www.aaaai.org 2. Food Allergy Research and Education (FARE): foodallergy.org 3. Mothers of Asthmatics: http://www.asthmacommunitynetwork.org 4. American College of Allergy, Asthma, and Immunology: www.acaai.org   COVID-19 Vaccine Information can be found at:  [email or web address] For questions related to vaccine distribution or appointments, please email [email or web address] or call [phone].     "Like" us  on Facebook and Instagram for our latest updates!      A healthy democracy works best when [employer] participate! Make sure you are registered to vote! If you have moved or changed any of your contact information, you will need to get this updated before voting! Scan the QR codes below to learn more!

## 2023-08-25 ENCOUNTER — Encounter: Payer: Self-pay | Admitting: Allergy & Immunology

## 2023-08-26 ENCOUNTER — Ambulatory Visit (HOSPITAL_COMMUNITY): Payer: No Typology Code available for payment source

## 2023-08-26 VITALS — BP 140/70 | HR 102 | Ht 72.0 in | Wt 330.0 lb

## 2023-08-26 DIAGNOSIS — F250 Schizoaffective disorder, bipolar type: Secondary | ICD-10-CM | POA: Diagnosis not present

## 2023-08-26 MED ORDER — PALIPERIDONE PALMITATE ER 1560 MG/5ML IM SUSY
1560.0000 mg | PREFILLED_SYRINGE | Freq: Once | INTRAMUSCULAR | Status: AC
Start: 2023-08-26 — End: 2023-08-26
  Administered 2023-08-26: 1560 mg via INTRAMUSCULAR

## 2023-08-26 NOTE — Progress Notes (Cosign Needed)
 Pt presents today for injection of Invega  Hafyera. This injection was given in patients Right upper outer quadrant with no complaints.     JNL, CMA    Patient states that medicine is god for him overall all. Patient denies any AVH, SI, and HI. Pt presents to be well groomed and very conversational. Patient states that at first medicine was not working during the first time of him receiving it, however now this is a Chemical engineer for him now that it has settled in his system over the years. Patient states that he had white coat syndrome for this app since he doesn't come often which concluded his BP and HR.

## 2023-09-17 ENCOUNTER — Telehealth (HOSPITAL_COMMUNITY): Payer: Self-pay | Admitting: Psychiatry

## 2023-09-17 NOTE — Telephone Encounter (Signed)
 Thank you for this update.  Thank you for rescheduling.

## 2023-09-24 ENCOUNTER — Telehealth (HOSPITAL_COMMUNITY): Admitting: Psychiatry

## 2023-11-23 NOTE — Progress Notes (Unsigned)
 BH MD Outpatient Progress Note  11/25/2023 4:39 PM Nathaniel [name]  MRN:  [id number]  Assessment:  Nathaniel [name] presents for follow-up evaluation. Today, 11/25/23, patient reports continued remission of positive symptoms of psychosis (denies AVH, delusional thought content since time of last hospitalization) although reports frustration with side effects attributed to Invega  including weight gain and decreased motivation. Mood remains stable without signs/sx of depression or mania at this time. Reviewed with patient risks/benefits of changing current medication regimen and offered further evaluation and management of noted side effects prior to changing medication. He is currently on Wegovy through PCP which is actively being titrated; discussed option to add metformin  however he elects to defer until he can assess response to [facility] alone. In regards to decreased energy/motivation, reviewed that untreated sleep apnea may be contributing - last seen by sleep medicine May 2023. Recommended referral back to sleep medicine however he declines this as well. He is amenable to continuing medications as rx for today and was provided information about possible more weight-neutral alternatives to Invega . However, would need to carefully monitor risks/benefits of changing current regimen given overall psychiatric stability.  RTC in 2 months by video with Nathaniel Bach, NP.  Identifying Information: Nathaniel [name] is a 23 y.o. male with a history of schizoaffective disorder bipolar type, GAD, HTN, pulmonary emboli, asthma, and OSA who is an established patient with [facility].   Plan:  # Schizoaffective disorder bipolar type Past medication trials:  Status of problem: stable Interventions: -- Continue Invega  Hafyera 1092 mg Q6months; last received 08/26/23  -- Next injection due 02/24/24 -- Continue trazodone  100 mg nightly PRN sleep  # Inattention Past medication trials: Strattera   (ineffective) Status of problem: new problem to this provider Interventions: -- No longer taking Strattera  80 mg daily -- Likely impacted by past cannabis use and untreated sleep apnea; would not classify as ADHD at this time -- Recommended referral to sleep medicine however patient has declined  # Weight gain 2/2 antipsychotic Past medication trials: Zepbound Status of problem: acute Interventions: -- Continue Wegovy through PCP; undergoing active titration -- Discussed start of metformin  however patient defers -- Importance of lifestyle changes discussed -- Patient expresses interest in more weight-neutral medication alternatives to Invega ; provided with medication handout on Abilify however would need to carefully monitor risks/benefits of changing current regimen  # Past cannabis use Status of problem: in remission Interventions: -- Continue to promote ongoing cessation  # Medication monitoring Interventions: -- Invega  Hafyera  -- Lipid profile: revealing for elevated TG, LDL; low HDL 09/06/23 - managed by PCP  -- HgbA1c: 5.6 09/06/23  -- CMP 11/09/23 revealing for slightly elevated ALT otherwise grossly wnl   -- EKG 10/07/22 Qtc [place] 477  Patient was given contact information for behavioral health clinic and was instructed to call 911 for emergencies.   Subjective:  Chief Complaint:  Chief Complaint  Patient presents with   Medication Management    Interval History:   Chart review: -- Last seen by Nathaniel Bach NP 07/16/23. Diagnosis felt c/w schizoaffective disorder bipolar type, ADHD inattentive type, history of substance use. Managed on Invega  Hafyera 1092 Q6months, Strattera  80 mg daily, trazodone  100 mg nightly.   He reports Hafyera initially took time to take effect but as he adjusted to medication helped with disorganized thinking, delusions, and IOR. Reports in the past, would receive messages through technology and misinterpret lyrics to songs. Reports very  infrequent history of AVH - describes episode years ago seeing a  truck driving through the trees and other in which he saw a shadow looking like a man and a cartoon snake. Denies history of AH or CAH. Reports history of paranoia and grandiosity (thought he had special powers) but this has been in remission since being on Invega .   However, has noticed side effects including low energy and appetite increase. Reports he used to go the gym but decreased motivation to do this now. Reports occasional abnormal muscle movements described as muscle jerking. Declines medications at this time as this is manageable. Denies tremor, stiffness, orofacial movements. This Clinical research associate mentioned observation of frequent eye blinking; he denies awareness of this.   Describes general mood as in the middle - denies persistent periods of depression or excessively elevated mood.  Denies substantial irritability. Denies recent AVH or IOR. Reports low motivation and harder to engage in day to day tasks including self-hygiene, going to the gym, schoolwork. Energy is sometimes low.   Denies SI/HI - last during period of acute psychosis.  Sleeping well with about 8-10 hours nightly. Reports he has been told he sometimes snores and that he has mild OSA. Was placed on CPAP but he stopped. PCP is titrating Wegovy; no change in weight thus far.   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 Enrolled in community college and pursuing bachelor's degree.   He inquires into alternatives to Invega  given side effects; risks/benefits of switch to alternative medication reviewed including that patient has experienced remission of positive symptoms of psychosis and it may be favorable to manage current side effects. Reviewed start of metformin  for antipsychotic-associated weight gain; he would like to defer until he sees response from [facility] titration. Importance of lifestyle interventions including daily walks  reviewed.  Discussed that low motivation/energy may be impacted by untreated sleep apnea; recommended referral to sleep medicine (last seen 2023). He declines as he feels sleep has been better compared to prior.   Amenable to continuing as prescribed. For future consideration, he does inquire into Cobenfy - reviewed lack of indication for schizoaffective disorder. Reviewed more weight-neutral options including Abilify and he would appreciate medication handout for further reading.   Visit Diagnosis:    ICD-10-CM   1. Schizoaffective disorder, bipolar type (HCC) [F25.0]  F25.0     2. Generalized anxiety disorder  F41.1       Past Psychiatric History:  Diagnoses: schizoaffective disorder bipolar type, inattention (has not undergone formal neuropsychological testing), past cannabis use, GAD Medication trials: Zyprexa , Risperdal , Buspar , Ingrezza , Atarax , Cogentin  Hospitalizations: last in June 2024 for SI; July 2023 for paranoia and homicidal threats; Nov 2021 for substance induced psychosis Suicide attempts: denies SIB: denies Hx of violence towards others: denies Current access to guns: denies Hx of trauma/abuse: denies Substance use:   -- Cannabis: last used June 2024 (UDS + June 2024)  -- Cocaine x1 at 23 yo  -- LSD x3 last at 23 yo  -- Tobacco: vapes rarely  -- Denies use of other illicit drugs  -- Etoh: 1-2 drinks every few weeks  Past Medical History:  Past Medical History:  Diagnosis Date   Allergic rhinitis    Chronic tonsillar hypertrophy    ENT did tonsillectomy 2020   Elevated blood pressure reading without diagnosis of hypertension    IFG (impaired fasting glucose) 12/2017   Gluc 107, HbA1c 5.5%   Obesity, Class II, BMI 35-39.9    OSA (obstructive sleep apnea) 02/2018   Sleep study recommended by Dr.  Athar;pt considering it.   Pulmonary embolism (HCC) 09/10/2022   Schizoaffective disorder (HCC)    Severe persistent asthma    Dr. Tiana    Past Surgical  History:  Procedure Laterality Date   TONSILLECTOMY  06/03/2018    Family Psychiatric History: denies  Family History:  Family History  Problem Relation Age of Onset   Heart attack Other    Prostate cancer Other    Hypertension Father    Hypertension Maternal Grandfather    Cancer Maternal Grandfather    Asthma Neg Hx     Social History:   Social History   Socioeconomic History   Marital status: Single    Spouse name: Not on file   Number of children: Not on file   Years of education: Not on file   Highest education level: Not on file  Occupational History   Not on file  Tobacco Use   Smoking status: Former    Current packs/day: 0.25    Types: Cigarettes    Passive exposure: Past   Smokeless tobacco: Never  Vaping Use   Vaping status: Some Days   Substances: Nicotine  Substance and Sexual Activity   Alcohol use: Yes    Comment: Rare   Drug use: Not Currently    Types: Marijuana    Comment: last used June 2024   Sexual activity: Yes  Other Topics Concern   Not on file  Social History Narrative   Single, lives with mom in [place].   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   No T/A/Ds.   Social Drivers of Corporate investment banker Strain: Low Risk  (09/03/2023)   Received from [facility]   Overall Financial Resource Strain (CARDIA)    Difficulty of Paying Living Expenses: Not hard at all  Food Insecurity: No Food Insecurity (09/03/2023)   Received from [facility]   Hunger Vital Sign    Within the past 12 months, you worried that your food would run out before you got the money to buy more.: Never true    Within the past 12 months, the food you bought just didn't last and you didn't have money to get more.: Never true  Transportation Needs: No Transportation Needs (09/03/2023)   Received from [facility] - Transportation    Lack of Transportation (Medical): No    Lack of Transportation (Non-Medical): No   Physical Activity: Sufficiently Active (09/03/2023)   Received from [facility]   Exercise Vital Sign    On average, how many days per week do you engage in moderate to strenuous exercise (like a brisk walk)?: 3 days    On average, how many minutes do you engage in exercise at this level?: 60 min  Stress: No Stress Concern Present (09/03/2023)   Received from [facility] of Occupational Health - Occupational Stress Questionnaire    Feeling of Stress : Not at all  Social Connections: Socially Integrated (09/03/2023)   Received from [facility]   Social Network    How would you rate your social network (family, work, friends)?: Good participation with social networks    Allergies:  Allergies  Allergen Reactions   Amoxicillin Shortness Of Breath, Swelling and Other (See Comments)    Current Medications: Current Outpatient Medications  Medication Sig Dispense Refill   albuterol  (VENTOLIN  HFA) 108 (90 Base) MCG/ACT inhaler Inhale 2 puffs into the lungs every 4 (four) hours as  needed for wheezing or shortness of breath. 18 each 1   Budeson-Glycopyrrol-Formoterol  (BREZTRI  AEROSPHERE) 160-9-4.8 MCG/ACT AERO INHALE 2 PUFFS INTO THE LUNGS TWICE A DAY 10.7 g 1   cetirizine  (ZYRTEC ) 10 MG tablet Take 1 tablet (10 mg total) by mouth daily. 90 tablet 1   Cholecalciferol  (VITAMIN D-3) 125 MCG (5000 UT) TABS Take 5,000 Units by mouth daily.     doxazosin  (CARDURA ) 1 MG tablet Take 1 mg by mouth daily.     lisinopril  (ZESTRIL ) 20 MG tablet Take 20 mg by mouth daily.     metoprolol  succinate (TOPROL -XL) 50 MG 24 hr tablet Take 100 mg by mouth 2 (two) times daily.     paliperidone  Palmitate ER (INVEGA  HAFYERA) 1092 MG/3.5ML SUSY Inject 3.5 mLs (1,092 mg total) into the muscle every 6 (six) months.     Semaglutide-Weight Management 1.7 MG/0.75ML SOAJ Inject 1.7 mg into the skin once a week.     traZODone  (DESYREL ) 100 MG tablet Take 1 tablet (100 mg total) by mouth at bedtime as  needed. for sleep 90 tablet 2   Omega-3 1400 MG CAPS Take 1,400 mg by mouth daily.     No current facility-administered medications for this visit.    ROS: See above  Objective:  Psychiatric Specialty Exam: There were no vitals taken for this visit.There is no height or weight on file to calculate BMI.  General Appearance: Casual and Fairly Groomed  Eye Contact:  Good; increased blinking  Speech:  Clear and Coherent and increased rate but not pressured  Volume:  Normal  Mood:  good  Affect:  Euthymic; calm  Thought Content: Denies AVH; IOR; paranoia/grandiosity   Suicidal Thoughts:  No  Homicidal Thoughts:  No  Thought Process:  Goal Directed and Linear  Orientation:  Full (Time, Place, and Person)    Memory:  Grossly intact  Judgment:  Fair  Insight:  Fair  Concentration:  Concentration: Good  Recall:  not formally assessed   Fund of Knowledge: Good  Language: Good  Psychomotor Activity:  Normal  Akathisia:  No  AIMS (if indicated): not done  Assets:  Communication Skills Desire for Improvement Housing Leisure Time Social Support Talents/Skills Transportation Vocational/Educational  ADL's:  Intact  Cognition: WNL  Sleep:  Good   PE: General: sits comfortably in view of camera; no acute distress  Pulm: no increased work of breathing on room air  MSK: all extremity movements appear intact  Neuro: no focal neurological deficits observed  Gait & Station: unable to assess by video    Metabolic Disorder Labs: Lab Results  Component Value Date   HGBA1C 5.3 07/19/2023   MPG 111.15 03/14/2020   MPG 114 08/27/2014   Lab Results  Component Value Date   PROLACTIN 34.5 (H) 07/19/2023   Lab Results  Component Value Date   CHOL 201 (H) 07/19/2023   TRIG 146 07/19/2023   HDL 34 (L) 07/19/2023   CHOLHDL 5.9 (H) 07/19/2023   VLDL 27 03/14/2020   LDLCALC 140 (H) 07/19/2023   LDLCALC 120 (H) 03/14/2020   Lab Results  Component Value Date   TSH 1.800  07/19/2023   TSH 2.519 03/15/2020    Therapeutic Level Labs: No results found for: LITHIUM No results found for: VALPROATE No results found for: CBMZ  Screenings:  AIMS    Flowsheet Row Office Visit from 08/27/2022 in [facility] Office Visit from 05/28/2022 in [facility] Office Visit from 02/26/2022 in [facility] (Dis[facility]  AIMS Total Score 4 0 1 0   AUDIT    Flowsheet Row Admission (Discharged) from OP [facility]  Alcohol Use Disorder Identification Test Final Score (AUDIT) 1   GAD-7    Flowsheet Row Video Visit from 07/16/2023 in [facility] Video Visit from 05/14/2023 in [facility] Video Visit from 02/26/2023 in [facility] Video Visit from 01/07/2023 in [facility] Video Visit from 10/22/2022 in [facility]  Total GAD-7 Score 0 0 7 2 5    PHQ2-9    Flowsheet Row Video Visit from 07/16/2023 in [facility] Video Visit from 05/14/2023 in [facility] Video Visit from 02/26/2023 in [facility] Video Visit from 01/07/2023 in [facility] Video Visit from 10/22/2022 in [facility]  PHQ-2 Total Score 0 0 4 0 1  PHQ-9 Total Score 0 1 11 4 4    Flowsheet Row Video Visit from 10/22/2022 in [facility] Admission (Discharged) from 10/09/2022 in [facility] ED from 10/08/2022 in [facility] Department at [facility]  C-SSRS RISK CATEGORY Error: Q3, 4, or 5 should not be populated when Q2 is No No Risk Moderate Risk     Collaboration of Care: Collaboration of Care: Medication Management AEB active medication management and Psychiatrist AEB established with [facility]  Patient/Guardian was advised Release of Information must be obtained prior to any record release in order to collaborate their care with an outside provider. Patient/Guardian was advised if they have not already done so to contact the registration department to sign all necessary forms in order for us  to release information regarding their care.   Consent: Patient/Guardian gives verbal consent for treatment and assignment of benefits for services provided during this visit. Patient/Guardian expressed understanding and agreed to proceed.   Televisit via video: I connected with patient on 11/25/23 at  2:30 PM EDT by a video enabled telemedicine application and verified that I am speaking with the correct person using two identifiers.  Location: Patient: home address in [place] Provider: remote office in [place]   I discussed the limitations of evaluation and management by telemedicine and the availability of in person appointments. The patient expressed understanding and agreed to proceed.  I discussed the assessment and treatment plan with the patient. The patient was provided an opportunity to ask questions and all were answered. The patient agreed with the plan and demonstrated an understanding of the instructions.   The patient was advised to call back or seek an in-person evaluation if the symptoms worsen or if the condition fails to improve as anticipated.  I provided 60 minutes dedicated to the care of this patient via video on the date of this encounter to include chart review, face-to-face time with the patient, medication management/counseling, documentation.  Nathaniel [name] 11/25/2023, 4:39 PM

## 2023-11-25 ENCOUNTER — Encounter (HOSPITAL_COMMUNITY): Payer: Self-pay | Admitting: Psychiatry

## 2023-11-25 ENCOUNTER — Telehealth (HOSPITAL_COMMUNITY): Admitting: Psychiatry

## 2023-11-25 DIAGNOSIS — F411 Generalized anxiety disorder: Secondary | ICD-10-CM | POA: Diagnosis not present

## 2023-11-25 DIAGNOSIS — F250 Schizoaffective disorder, bipolar type: Secondary | ICD-10-CM

## 2023-11-25 NOTE — Patient Instructions (Addendum)
 Thank you for attending your appointment today.  -- We did not make any medication changes today. Please continue medications as prescribed. -- The medication we discussed for future consideration is called Abilify. I have attached information about this medication below. This medication also comes in injection form.  Please do not make any changes to medications without first discussing with your provider. If you are experiencing a psychiatric emergency, please call 911 or present to your nearest emergency department. Additional crisis, medication management, and therapy resources are included below.  [facility]  [address], [place], [place] [address] [phone] WALK-IN URGENT CARE 24/7 FOR ANYONE [address], [place], [place]  [phone] Fax: [phone] [email or web address] *Interpreters available *Accepts all insurance and uninsured for Urgent Care needs *Accepts Medicaid and uninsured for outpatient treatment (below)      ONLY FOR [facility]  Below:    Outpatient New Patient Assessment/Therapy Walk-ins:        Monday, Wednesday, and Thursday 8am until slots are full (first come, first served)                   New Patient Psychiatry/Medication Management        Monday-Friday 8am-11am (first come, first served)               For all walk-ins we ask that you arrive by 7:15am, because patients will be seen in the order of arrival.

## 2024-01-03 ENCOUNTER — Other Ambulatory Visit (HOSPITAL_COMMUNITY): Payer: Self-pay | Admitting: Psychiatry

## 2024-01-03 DIAGNOSIS — F250 Schizoaffective disorder, bipolar type: Secondary | ICD-10-CM

## 2024-01-06 ENCOUNTER — Telehealth (HOSPITAL_COMMUNITY): Payer: Self-pay | Admitting: *Deleted

## 2024-01-06 NOTE — Telephone Encounter (Signed)
 Fax from [facility] in [place] for an rx for his invega  hafyera. Fax states he is on patient assistance. He has a future appt here in Oct for his next shot. The last note on him did not say anything re him traveling out of state or moving. I consulted my peer and his shot is not here on the premises and I dont know of a way to transfer his patient assitance, if he has rellocated he would need his new agency to make a new application or if he is staying in [place] for a period of time his mom could pick his shot up from this clinic closer to the date he is due as it doesn't arrive much before appt and mail it to him. Responded on the fax received from pharmacy what is in this note.

## 2024-01-07 ENCOUNTER — Telehealth (HOSPITAL_COMMUNITY): Payer: Self-pay

## 2024-01-19 ENCOUNTER — Telehealth (HOSPITAL_COMMUNITY): Admitting: Psychiatry

## 2024-01-19 ENCOUNTER — Encounter (HOSPITAL_COMMUNITY): Payer: Self-pay | Admitting: Psychiatry

## 2024-01-19 DIAGNOSIS — F5105 Insomnia due to other mental disorder: Secondary | ICD-10-CM | POA: Diagnosis not present

## 2024-01-19 DIAGNOSIS — F900 Attention-deficit hyperactivity disorder, predominantly inattentive type: Secondary | ICD-10-CM

## 2024-01-19 DIAGNOSIS — F250 Schizoaffective disorder, bipolar type: Secondary | ICD-10-CM | POA: Diagnosis not present

## 2024-01-19 MED ORDER — BUPROPION HCL ER (XL) 150 MG PO TB24: 150.0000 mg | ORAL_TABLET | ORAL | 3 refills | Status: DC

## 2024-01-19 MED ORDER — INVEGA HAFYERA 1092 MG/3.5ML IM SUSY
1092.0000 mg | PREFILLED_SYRINGE | INTRAMUSCULAR | 11 refills | Status: DC
Start: 2024-01-19 — End: 2024-02-16

## 2024-01-19 MED ORDER — TRAZODONE HCL 150 MG PO TABS: 150.0000 mg | ORAL_TABLET | Freq: Every evening | ORAL | 3 refills | Status: DC | PRN

## 2024-01-19 NOTE — Progress Notes (Signed)
 [facility] MD/PA/NP OP Progress Note Virtual Visit via Video Note  I connected with Nathaniel [name] on 01/19/24 at  3:00 PM EDT by a video enabled telemedicine application and verified that I am speaking with the correct person using two identifiers.  Location: Patient: Home Provider: Clinic   I discussed the limitations of evaluation and management by telemedicine and the availability of in person appointments. The patient expressed understanding and agreed to proceed.  I provided 30 minutes of non-face-to-face time during this encounter.     01/19/2024 11:13 AM Nathaniel [name]  MRN:  [id number]  Chief Complaint: I feel that the medication wears off a few weeks or month before my next dose and I can't sleep  HPI:  23 year old male seen today for follow-up psychiatric evaluation. He has a psychiatric history of substance use, tobacco dependence, schizoaffective disorder bipolar type, and anxiety.  He is currently managed on Invega  Hafyera 1092 every 6 months, Strattera  80 mg daily, and trazodone  100 mg nightly.  He reports that he has not been taking Strattera  and notes thathis other medications are somewhat effective in managing his psychiatric condition.  Today he is well-groomed, pleasant, cooperative, engaged in conversation, maintaining eye contact.  He informed Clinical research associate that a few weeks to a month prior to his next Invega  Halyera he feels that it wears off and his sleep is impacted. He notes that he sleeps approximately 3-5 hours. Provider asked patient if he has been taking his trazodone . He notes that he takes it periodically and finds it ineffective. Provider recommenced increasing trazodone  and he was agreeable.   Patient notes that he graduated from [employer] with an associates in General studies. He notes that's he attempted to get a bachelors but was unable to complete it because of symptoms of ADHD. He described being forgetful, disorganized, and inattentive to mentally taxing task. Patient notes  that he would be interested in starting something to help manage these symptoms as Strattera  has been ineffective. Patient inquired about Adderall. Provider informed patient that sleep can interfere with attention and informed him that Adderall is not the best option at this time. Provider informed patient that Wellbutrin  could be trailed. He was agreeable.   He patient notes that he no longer smokes tobacco but recently tried Marijuana.  Provider informed patient that marijuana can exacerbate his mental health.  He endorsed understanding and notes that he does not wish to continue the substance although he did report that he feels more normal while on marijuana.   Since his last visit he notes that his anxiety continues to be well-managed.  He does note that he has increased symptoms of depression.  Provider conducted a GAD-7 and patient scored a 0, at his last visit he scored a 0.  Provider also conducted PHQ-9 the patient scored a 6, at his last visit he scored a 0.  He endorses adequate appetite.  Today he denies SI/HI/AVH, mania, paranoia.   Today trazodone  100 mg increased to 150 mg.  Patient will start Wellbutrin  XL 150 mg.  Patient will come in a week early to receive his Invega  half year injection.At this time Strattera  not continued.Potential side effects of medication and risks vs benefits of treatment vs non-treatment were explained and discussed. All questions were answered.  No other concerns noted at this time.   Visit Diagnosis:    ICD-10-CM   1. Attention deficit hyperactivity disorder (ADHD), predominantly inattentive type  F90.0 buPROPion  (WELLBUTRIN  XL) 150 MG 24 hr tablet  2. Schizoaffective disorder, bipolar type (HCC)  F25.0 paliperidone  Palmitate ER (INVEGA  HAFYERA) 1092 MG/3.5ML SUSY    traZODone  (DESYREL ) 150 MG tablet    3. Insomnia due to mental disorder  F51.05 traZODone  (DESYREL ) 150 MG tablet           Past Psychiatric History:  substance use, tobacco  dependence, schizoaffective disorder bipolar type, and anxiety  Past Medical History:  Past Medical History:  Diagnosis Date   Allergic rhinitis    Chronic tonsillar hypertrophy    ENT did tonsillectomy 2020   Elevated blood pressure reading without diagnosis of hypertension    IFG (impaired fasting glucose) 12/2017   Gluc 107, HbA1c 5.5%   Obesity, Class II, BMI 35-39.9    OSA (obstructive sleep apnea) 02/2018   Sleep study recommended by Dr. Sylvan considering it.   Pulmonary embolism (HCC) 09/10/2022   Schizoaffective disorder (HCC)    Severe persistent asthma    Dr. Tiana    Past Surgical History:  Procedure Laterality Date   TONSILLECTOMY  06/03/2018    Family Psychiatric History:  Denies  Family History:  Family History  Problem Relation Age of Onset   Heart attack Other    Prostate cancer Other    Hypertension Father    Hypertension Maternal Grandfather    Cancer Maternal Grandfather    Asthma Neg Hx     Social History:  Social History   Socioeconomic History   Marital status: Single    Spouse name: Not on file   Number of children: Not on file   Years of education: Not on file   Highest education level: Not on file  Occupational History   Not on file  Tobacco Use   Smoking status: Former    Current packs/day: 0.25    Types: Cigarettes    Passive exposure: Past   Smokeless tobacco: Never  Vaping Use   Vaping status: Some Days   Substances: Nicotine  Substance and Sexual Activity   Alcohol use: Yes    Comment: Rare   Drug use: Not Currently    Types: Marijuana    Comment: last used June 2024   Sexual activity: Yes  Other Topics Concern   Not on file  Social History Narrative   Single, lives with mom in [place].   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   No T/A/Ds.   Social Drivers of Corporate investment banker Strain: Low Risk  (09/03/2023)   Received from [facility]   Overall Financial Resource  Strain (CARDIA)    Difficulty of Paying Living Expenses: Not hard at all  Food Insecurity: No Food Insecurity (09/03/2023)   Received from [facility]   Hunger Vital Sign    Within the past 12 months, you worried that your food would run out before you got the money to buy more.: Never true    Within the past 12 months, the food you bought just didn't last and you didn't have money to get more.: Never true  Transportation Needs: No Transportation Needs (09/03/2023)   Received from  [facility] - Transportation    Lack of Transportation (Medical): No    Lack of Transportation (Non-Medical): No  Physical Activity: Sufficiently Active (09/03/2023)   Received from [facility]   Exercise Vital Sign    On average, how many days per week do you engage in moderate to strenuous exercise (like a brisk walk)?: 3 days  On average, how many minutes do you engage in exercise at this level?: 60 min  Stress: No Stress Concern Present (09/03/2023)   Received from [facility] - Occupational Stress Questionnaire    Feeling of Stress : Not at all  Social Connections: Socially Integrated (09/03/2023)   Received from [facility]   Social Network    How would you rate your social network (family, work, friends)?: Good participation with social networks    Allergies:  Allergies  Allergen Reactions   Amoxicillin Shortness Of Breath, Swelling and Other (See Comments)    Metabolic Disorder Labs: Lab Results  Component Value Date   HGBA1C 5.3 07/19/2023   MPG 111.15 03/14/2020   MPG 114 08/27/2014   Lab Results  Component Value Date   PROLACTIN 34.5 (H) 07/19/2023   Lab Results  Component Value Date   CHOL 201 (H) 07/19/2023   TRIG 146 07/19/2023   HDL 34 (L) 07/19/2023   CHOLHDL 5.9 (H) 07/19/2023   VLDL 27 03/14/2020   LDLCALC 140 (H) 07/19/2023   LDLCALC 120 (H) 03/14/2020   Lab Results  Component Value Date   TSH 1.800 07/19/2023    TSH 2.519 03/15/2020    Therapeutic Level Labs: No results found for: LITHIUM No results found for: VALPROATE No results found for: CBMZ  Current Medications: Current Outpatient Medications  Medication Sig Dispense Refill   buPROPion  (WELLBUTRIN  XL) 150 MG 24 hr tablet Take 1 tablet (150 mg total) by mouth every morning. 30 tablet 3   albuterol  (VENTOLIN  HFA) 108 (90 Base) MCG/ACT inhaler Inhale 2 puffs into the lungs every 4 (four) hours as needed for wheezing or shortness of breath. 18 each 1   Budeson-Glycopyrrol-Formoterol  (BREZTRI  AEROSPHERE) 160-9-4.8 MCG/ACT AERO INHALE 2 PUFFS INTO THE LUNGS TWICE A DAY 10.7 g 1   cetirizine  (ZYRTEC ) 10 MG tablet Take 1 tablet (10 mg total) by mouth daily. 90 tablet 1   Cholecalciferol  (VITAMIN D-3) 125 MCG (5000 UT) TABS Take 5,000 Units by mouth daily.     doxazosin  (CARDURA ) 1 MG tablet Take 1 mg by mouth daily.     lisinopril  (ZESTRIL ) 20 MG tablet Take 20 mg by mouth daily.     metoprolol  succinate (TOPROL -XL) 50 MG 24 hr tablet Take 100 mg by mouth 2 (two) times daily.     Omega-3 1400 MG CAPS Take 1,400 mg by mouth daily.     paliperidone  Palmitate ER (INVEGA  HAFYERA) 1092 MG/3.5ML SUSY Take 3.5 mLs (1,092 mg total) by mouth every 6 (six) months. 38.4 mL 11   Semaglutide-Weight Management 1.7 MG/0.75ML SOAJ Inject 1.7 mg into the skin once a week.     traZODone  (DESYREL ) 150 MG tablet Take 1 tablet (150 mg total) by mouth at bedtime as needed. for sleep 30 tablet 3   No current facility-administered medications for this visit.     Musculoskeletal: Strength & Muscle Tone: within normal limits and telehealth visit Gait & Station: normal, telehealth visit Patient leans: N/A  Psychiatric Specialty Exam: Review of Systems  There were no vitals taken for this visit.There is no height or weight on file to calculate BMI.  General Appearance: Well Groomed  Eye Contact:  Good  Speech:  Clear and Coherent and Normal Rate  Volume:   Normal  Mood:  Euthymic  Affect:  Appropriate and Congruent  Thought Process:  Coherent, Goal Directed, and Linear  Orientation:  Full (Time, Place, and Person)  Thought Content: WDL  and Logical   Suicidal Thoughts:  No  Homicidal Thoughts:  No  Memory:  Immediate;   Good Recent;   Good Remote;   Good  Judgement:  Good  Insight:  Good  Psychomotor Activity:  Normal  Concentration:  Concentration: Good and Attention Span: Good  Recall:  Good  Fund of Knowledge: Good  Language: Good  Akathisia:  No  Handed:  Right  AIMS (if indicated): not done  Assets:  Communication Skills Desire for Improvement Financial Resources/Insurance Housing Physical Health Social Support  ADL's:  Intact  Cognition: WNL  Sleep:  Good   Screenings: AIMS    Flowsheet Row Office Visit from 08/27/2022 in [facility] Office Visit from 05/28/2022 in [facility] Office Visit from 02/26/2022 in [facility] Admission (Discharged[facility]  AIMS Total Score 4 0 1 0   AUDIT    Flowsheet Row Admission (Discharged) from [facility]  Alcohol Use Disorder Identification Test Final Score (AUDIT) 1   GAD-7    Flowsheet Row Video Visit from 01/19/2024 in [facility] Video Visit from 07/16/2023 in [facility] Video Visit from 05/14/2023 in [facility] Video Visit from 02/26/2023 in [facility] Video Visit from 01/07/2023 in [facility]  Total GAD-7 Score 0 0 0 7 2   PHQ2-9    Flowsheet Row Video Visit from 01/19/2024 in [facility] Video Visit from 07/16/2023 in [facility] Video Visit from 05/14/2023 in  [facility] Video Visit from 02/26/2023 in [facility] Video Visit from 01/07/2023 in [facility]  PHQ-2 Total Score 2 0 0 4 0  PHQ-9 Total Score 6 0 1 11 4    Flowsheet Row Video Visit from 10/22/2022 in [facility] Admission (Discharged) from 10/09/2022 in [facility] ED from 10/08/2022 in [facility] Emergency Department at [facility]  C-SSRS RISK CATEGORY Error: Q3, 4, or 5 should not be populated when Q2 is No No Risk Moderate Risk     Assessment and Plan: Patient informed that a few weeks to a month before he takes his next injection he feels that the medication wears off which impacts his sleep.  He also complains of symptoms of ADHD.Today trazodone  100 mg increased to 150 mg.  Patient will start Wellbutrin  XL 150 mg.  Patient will come in a week early to receive his Invega  half year injection.  At this time Strattera  not continued. 1. Schizoaffective disorder, bipolar type (HCC)  Continue- paliperidone  Palmitate ER (INVEGA  HAFYERA) 1092 MG/3.5ML SUSY; Take 3.5 mLs (1,092 mg total) by mouth every 6 (six) months.  Dispense: 38.4 mL; Refill: 11 Increased- traZODone  (DESYREL ) 150 MG tablet; Take 1 tablet (150 mg total) by mouth at bedtime as needed. for sleep  Dispense: 30 tablet; Refill: 3  2. Attention deficit hyperactivity disorder (ADHD), predominantly inattentive type (Primary)  Start- buPROPion  (WELLBUTRIN  XL) 150 MG 24 hr tablet; Take 1 tablet (150 mg total) by mouth every morning.  Dispense: 30 tablet; Refill: 3  3. Insomnia due to mental disorder  Increased- traZODone  (DESYREL ) 150 MG tablet; Take 1 tablet (150 mg total) by mouth at bedtime as needed. for sleep  Dispense:  30 tablet; Refill: 3   Follow-up in 3 months Follow-up therapy Zane FORBES Bach, NP 01/19/2024, 11:13 AM

## 2024-01-25 ENCOUNTER — Telehealth (HOSPITAL_COMMUNITY): Payer: Self-pay

## 2024-01-25 ENCOUNTER — Other Ambulatory Visit (HOSPITAL_COMMUNITY): Payer: Self-pay | Admitting: Psychiatry

## 2024-01-25 DIAGNOSIS — F5105 Insomnia due to other mental disorder: Secondary | ICD-10-CM

## 2024-01-25 DIAGNOSIS — F250 Schizoaffective disorder, bipolar type: Secondary | ICD-10-CM

## 2024-01-25 MED ORDER — QUETIAPINE FUMARATE 50 MG PO TABS: 50.0000 mg | ORAL_TABLET | Freq: Every evening | ORAL | 3 refills | Status: DC | PRN

## 2024-01-25 MED ORDER — TRAZODONE HCL 100 MG PO TABS: 100.0000 mg | ORAL_TABLET | Freq: Every evening | ORAL | 2 refills | Status: DC | PRN

## 2024-01-25 NOTE — Telephone Encounter (Signed)
 Patient called in requesting communication with provider in regards to difficulty sleeping.Per patient his new medications aren't helping him rest at night. As well as concerns with his Wellbutrin 

## 2024-01-31 ENCOUNTER — Other Ambulatory Visit (HOSPITAL_COMMUNITY): Payer: Self-pay | Admitting: Psychiatry

## 2024-01-31 ENCOUNTER — Telehealth (HOSPITAL_COMMUNITY): Payer: Self-pay

## 2024-01-31 DIAGNOSIS — F5105 Insomnia due to other mental disorder: Secondary | ICD-10-CM

## 2024-01-31 DIAGNOSIS — F250 Schizoaffective disorder, bipolar type: Secondary | ICD-10-CM

## 2024-01-31 MED ORDER — TRAZODONE HCL 100 MG PO TABS: 200.0000 mg | ORAL_TABLET | Freq: Every evening | ORAL | 2 refills | Status: DC | PRN

## 2024-01-31 NOTE — Telephone Encounter (Signed)
 Patient's mother called in stating provider was supposed to send over prescription for trazodone  200 mg at bedtime for patient. Per patient mother patient has been taking 2 pills of 100 mg Trazodone  from previous prescription and have seen improvement in patient's sleep. Per mother they did not pick up recent prescription of Trazodone  100 mg as it was not the correct dosage. Mother is requesting the prescription of 200 mg to be sent over to pharmacy on file.   Per mother, patient has not started the Seroquel  50 mg at this time.

## 2024-01-31 NOTE — Telephone Encounter (Signed)
Order rewritten and sent to preferred pharmacy.

## 2024-02-16 ENCOUNTER — Telehealth (INDEPENDENT_AMBULATORY_CARE_PROVIDER_SITE_OTHER): Admitting: Psychiatry

## 2024-02-16 ENCOUNTER — Encounter (HOSPITAL_COMMUNITY): Payer: Self-pay | Admitting: Psychiatry

## 2024-02-16 ENCOUNTER — Encounter (HOSPITAL_COMMUNITY): Payer: Self-pay

## 2024-02-16 ENCOUNTER — Ambulatory Visit (INDEPENDENT_AMBULATORY_CARE_PROVIDER_SITE_OTHER)

## 2024-02-16 VITALS — BP 140/83 | HR 111 | Temp 98.2°F | Ht 72.0 in | Wt 338.2 lb

## 2024-02-16 DIAGNOSIS — G2401 Drug induced subacute dyskinesia: Secondary | ICD-10-CM

## 2024-02-16 DIAGNOSIS — F5105 Insomnia due to other mental disorder: Secondary | ICD-10-CM | POA: Diagnosis not present

## 2024-02-16 DIAGNOSIS — Z79899 Other long term (current) drug therapy: Secondary | ICD-10-CM

## 2024-02-16 DIAGNOSIS — Z5181 Encounter for therapeutic drug level monitoring: Secondary | ICD-10-CM | POA: Diagnosis not present

## 2024-02-16 DIAGNOSIS — F900 Attention-deficit hyperactivity disorder, predominantly inattentive type: Secondary | ICD-10-CM

## 2024-02-16 DIAGNOSIS — F250 Schizoaffective disorder, bipolar type: Secondary | ICD-10-CM

## 2024-02-16 DIAGNOSIS — F411 Generalized anxiety disorder: Secondary | ICD-10-CM

## 2024-02-16 MED ORDER — PALIPERIDONE PALMITATE ER 1092 MG/3.5ML IM SUSY
1092.0000 mg | PREFILLED_SYRINGE | Freq: Once | INTRAMUSCULAR | Status: AC
  Administered 2024-02-16: 1092 mg via INTRAMUSCULAR

## 2024-02-16 MED ORDER — TRAZODONE HCL 100 MG PO TABS: 200.0000 mg | ORAL_TABLET | Freq: Every evening | ORAL | 2 refills | Status: DC | PRN

## 2024-02-16 MED ORDER — INVEGA HAFYERA 1092 MG/3.5ML IM SUSY: 1092.0000 mg | PREFILLED_SYRINGE | INTRAMUSCULAR | 11 refills | Status: DC

## 2024-02-16 NOTE — Progress Notes (Signed)
 BH MD/PA/NP OP Progress Note Virtual Visit via Video Note  I connected with Nathaniel [name] on 02/16/24 at  2:00 PM EDT by a video enabled telemedicine application and verified that I am speaking with the correct person using two identifiers.  Location: Patient: Home Provider: Clinic   I discussed the limitations of evaluation and management by telemedicine and the availability of in person appointments. The patient expressed understanding and agreed to proceed.  I provided 30 minutes of non-face-to-face time during this encounter.     02/16/2024 3:23 PM Nathaniel [name]  MRN:  [id number]  Chief Complaint: I have  not been responding well to the Wellbutrin  for the last few days    HPI:  23 year old male seen today for follow-up psychiatric evaluation. He has a psychiatric history of substance use, tobacco dependence, schizoaffective disorder bipolar type, and anxiety.  He is currently managed on Invega  Hafyera 1092 every 6 months, Wellbutrin  XL 150 mg, and trazodone  200 mg nightly.  He reports that his medications are somewhat effective in managing his psychiatric condition.  Today he is well-groomed, pleasant, cooperative, engaged in conversation, maintaining eye contact.  He informed Clinical research associate that he has not been responding well to the Wellbutrin  for the last few days. He notes that he has been experiencing some psychosis. He notes that he saw ants on the floor that disappeared and he also notes that has felt bugs on his body for the last few days. Patient notes that he is fearful that his psychosis will worsen.  Patient is due for his next Invega  injection today.  Provider did tell patient that Wellbutrin  could be stopped.  He endorsed understanding and agreed.  Provided for patient that if psychosis persist another antipsychotic might need to be trialed with his current Invega  LAI or he would need to receive his injection a week earlier.  He endorsed understanding.  Patient was prescribed  Seroquel  in the past but had not started it.  Since his last visit he reports that his anxiety and depression continues to be well-managed.  Today provider conducted GAD-7 the patient scored a 8.  Provider also conducted PHQ-9 the patient scored a 0.  He endorses adequate sleep and appetite.  Today he denies SI/HI/AH.   Patient denies alcohol or illegal drug use.  Today Wellbutrin  discontinued.  Patient will continue his other medications as prescribed.  Patient had not had labs drawn in over 6 months.  Today provider ordered CBC, CMP, HgbA1c, lipid panel, thyroid  panel, LFT, and prolactin level.  Provider also ordered an EKG as patient has not had 1 in a year.  No other concerns at this time.   Visit Diagnosis:    ICD-10-CM   1. Schizoaffective disorder, bipolar type (HCC)  F25.0 paliperidone  Palmitate ER (INVEGA  HAFYERA) 1092 MG/3.5ML SUSY    traZODone  (DESYREL ) 100 MG tablet    CBC with Differential/Platelet    Comprehensive Metabolic Panel (CMET)    Lipid Profile    HgB A1c    Thyroid  Panel With TSH    Hepatic function panel    Prolactin    2. Insomnia due to mental disorder  F51.05 traZODone  (DESYREL ) 100 MG tablet            Past Psychiatric History:  substance use, tobacco dependence, schizoaffective disorder bipolar type, and anxiety  Past Medical History:  Past Medical History:  Diagnosis Date   Allergic rhinitis    Chronic tonsillar hypertrophy    ENT did tonsillectomy 2020   Elevated blood  pressure reading without diagnosis of hypertension    IFG (impaired fasting glucose) 12/2017   Gluc 107, HbA1c 5.5%   Obesity, Class II, BMI 35-39.9    OSA (obstructive sleep apnea) 02/2018   Sleep study recommended by Dr. Sylvan considering it.   Pulmonary embolism (HCC) 09/10/2022   Schizoaffective disorder (HCC)    Severe persistent asthma    Dr. Tiana    Past Surgical History:  Procedure Laterality Date   TONSILLECTOMY  06/03/2018    Family Psychiatric  History:  Denies  Family History:  Family History  Problem Relation Age of Onset   Heart attack Other    Prostate cancer Other    Hypertension Father    Hypertension Maternal Grandfather    Cancer Maternal Grandfather    Asthma Neg Hx     Social History:  Social History   Socioeconomic History   Marital status: Single    Spouse name: Not on file   Number of children: Not on file   Years of education: Not on file   Highest education level: Not on file  Occupational History   Not on file  Tobacco Use   Smoking status: Former    Current packs/day: 0.25    Types: Cigarettes    Passive exposure: Past   Smokeless tobacco: Never  Vaping Use   Vaping status: Some Days   Substances: Nicotine  Substance and Sexual Activity   Alcohol use: Yes    Comment: Rare   Drug use: Not Currently    Types: Marijuana    Comment: last used June 2024   Sexual activity: Yes  Other Topics Concern   Not on file  Social History Narrative   Single, lives with mom in [place].   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   No T/A/Ds.   Social Drivers of Corporate investment banker Strain: Low Risk  (09/03/2023)   Received from [facility]   Overall Financial Resource Strain (CARDIA)    Difficulty of Paying Living Expenses: Not hard at all  Food Insecurity: No Food Insecurity (09/03/2023)   Received from [facility]   Hunger Vital Sign    Within the past 12 months, you worried that your food would run out before you got the money to buy more.: Never true    Within the past 12 months, the food you bought just didn't last and you didn't have money to get more.: Never true  Transportation Needs: No Transportation Needs (09/03/2023)   Received from [facility] - Transportation    Lack of Transportation (Medical): No    Lack of Transportation (Non-Medical): No  Physical Activity: Sufficiently Active (09/03/2023)   Received from [facility]   Exercise  Vital Sign    On average, how many days per week do you engage in moderate to strenuous exercise (like a brisk walk)?: 3 days    On average, how many minutes do you engage in exercise at this level?: 60 min  Stress: No Stress Concern Present (09/03/2023)   Received from [facility] - Occupational Stress Questionnaire    Feeling of Stress : Not at all  Social Connections: Socially Integrated (09/03/2023)   Received from [facility]   Social Network    How would you rate your social network (family, work, friends)?: Good participation with social networks    Allergies:  Allergies  Allergen Reactions   Amoxicillin  Shortness Of Breath, Swelling and Other (See Comments)    Metabolic Disorder Labs: Lab Results  Component Value Date   HGBA1C 5.3 07/19/2023   MPG 111.15 03/14/2020   MPG 114 08/27/2014   Lab Results  Component Value Date   PROLACTIN 34.5 (H) 07/19/2023   Lab Results  Component Value Date   CHOL 201 (H) 07/19/2023   TRIG 146 07/19/2023   HDL 34 (L) 07/19/2023   CHOLHDL 5.9 (H) 07/19/2023   VLDL 27 03/14/2020   LDLCALC 140 (H) 07/19/2023   LDLCALC 120 (H) 03/14/2020   Lab Results  Component Value Date   TSH 1.800 07/19/2023   TSH 2.519 03/15/2020    Therapeutic Level Labs: No results found for: LITHIUM No results found for: VALPROATE No results found for: CBMZ  Current Medications: Current Outpatient Medications  Medication Sig Dispense Refill   albuterol  (VENTOLIN  HFA) 108 (90 Base) MCG/ACT inhaler Inhale 2 puffs into the lungs every 4 (four) hours as needed for wheezing or shortness of breath. 18 each 1   Budeson-Glycopyrrol-Formoterol  (BREZTRI  AEROSPHERE) 160-9-4.8 MCG/ACT AERO INHALE 2 PUFFS INTO THE LUNGS TWICE A DAY 10.7 g 1   cetirizine  (ZYRTEC ) 10 MG tablet Take 1 tablet (10 mg total) by mouth daily. 90 tablet 1   Cholecalciferol  (VITAMIN D-3) 125 MCG (5000 UT) TABS Take 5,000 Units by mouth daily.      doxazosin  (CARDURA ) 1 MG tablet Take 1 mg by mouth daily.     lisinopril  (ZESTRIL ) 20 MG tablet Take 20 mg by mouth daily.     metoprolol  succinate (TOPROL -XL) 50 MG 24 hr tablet Take 100 mg by mouth 2 (two) times daily.     Omega-3 1400 MG CAPS Take 1,400 mg by mouth daily.     paliperidone  Palmitate ER (INVEGA  HAFYERA) 1092 MG/3.5ML SUSY Take 3.5 mLs (1,092 mg total) by mouth every 6 (six) months. 38.4 mL 11   QUEtiapine  (SEROQUEL ) 50 MG tablet Take 1 tablet (50 mg total) by mouth at bedtime as needed. 30 tablet 3   Semaglutide-Weight Management 1.7 MG/0.75ML SOAJ Inject 1.7 mg into the skin once a week.     traZODone  (DESYREL ) 100 MG tablet Take 2 tablets (200 mg total) by mouth at bedtime as needed. for sleep 60 tablet 2   No current facility-administered medications for this visit.     Musculoskeletal: Strength & Muscle Tone: within normal limits and telehealth visit Gait & Station: normal, telehealth visit Patient leans: N/A  Psychiatric Specialty Exam: Review of Systems  There were no vitals taken for this visit.There is no height or weight on file to calculate BMI.  General Appearance: Well Groomed  Eye Contact:  Good  Speech:  Clear and Coherent and Normal Rate  Volume:  Normal  Mood:  Euthymic  Affect:  Appropriate and Congruent  Thought Process:  Coherent, Goal Directed, and Linear  Orientation:  Full (Time, Place, and Person)  Thought Content: Logical and Hallucinations: Tactile Visual   Suicidal Thoughts:  No  Homicidal Thoughts:  No  Memory:  Immediate;   Good Recent;   Good Remote;   Good  Judgement:  Good  Insight:  Good  Psychomotor Activity:  Normal  Concentration:  Concentration: Good and Attention Span: Good  Recall:  Good  Fund of Knowledge: Good  Language: Good  Akathisia:  No  Handed:  Right  AIMS (if indicated): not done  Assets:  Communication Skills Desire for Improvement Financial Resources/Insurance Housing Physical Health Social  Support  ADL's:  Intact  Cognition: WNL  Sleep:  Good   Screenings: AIMS    Flowsheet Row Office Visit from 08/27/2022 in [facility] Office Visit from 05/28/2022 in [facility] Office Visit from 02/26/2022 in [facility] Admission (Discharged[facility]  AIMS Total Score 4 0 1 0   AUDIT    Flowsheet Row Admission (Discharged) from [facility]  Alcohol Use Disorder Identification Test Final Score (AUDIT) 1   GAD-7    Flowsheet Row Video Visit from 02/16/2024 in [facility] Video Visit from 01/19/2024 in [facility] Video Visit from 07/16/2023 in [facility] Video Visit from 05/14/2023 in [facility] Video Visit from 02/26/2023 in [facility]  Total GAD-7 Score 8 0 0 0 7   PHQ2-9    Flowsheet Row Video Visit from 02/16/2024 in [facility] Video Visit from 01/19/2024 in [facility] Video Visit from 07/16/2023 in [facility] Video Visit from 05/14/2023 in [facility] Video Visit from 02/26/2023 in [facility]  PHQ-2 Total Score 0 2 0 0 4  PHQ-9 Total Score 0 6 0 1 11   Flowsheet Row Video Visit from 10/22/2022 in [facility] Admission (Discharged) from 10/09/2022 in [facility] ED from 10/08/2022 in [facility] Emergency Department at [facility]  C-SSRS RISK CATEGORY Error: Q3, 4, or 5 should not be populated when Q2 is No No Risk Moderate Risk     Assessment and Plan: Patient informed that he had visual hallucinations over the last few weeks.  He  also notes that he experienced tactile hallucinations a few days ago.  Patient Invega  Halfyera is due today.  Provider informed patient that his psychosis may have occurred because his shot is wearing off but patient notes and feels that the Wellbutrin  may be the cause.  Today Wellbutrin  discontinued.  Provider informed patient that if psychosis does not improve another antipsychotic can be added.  Patient was put on Seroquel  in the past but has not started it. Patient will continue his other medications as prescribed.  Patient had not had labs drawn in over 6 months.  Today provider ordered CBC, CMP, HgbA1c, lipid panel, thyroid  panel, LFT, and prolactin level.  Provider also ordered an EKG as patient has not had 1 in a year.  1. Schizoaffective disorder, bipolar type (HCC)  Continue- paliperidone  Palmitate ER (INVEGA  HAFYERA) 1092 MG/3.5ML SUSY; Take 3.5 mLs (1,092 mg total) by mouth every 6 (six) months.  Dispense: 38.4 mL; Refill: 11 Continue- traZODone  (DESYREL ) 100 MG tablet; Take 2 tablets (200 mg total) by mouth at bedtime as needed. for sleep  Dispense: 60 tablet; Refill: 2 - CBC with Differential/Platelet; Future - Comprehensive Metabolic Panel (CMET); Future - Lipid Profile; Future - HgB A1c; Future - Thyroid  Panel With TSH; Future - Hepatic function panel; Future - Prolactin; Future  2. Insomnia due to mental disorder  Continue- traZODone  (DESYREL ) 100 MG tablet; Take 2 tablets (200 mg total) by mouth at bedtime as needed. for sleep  Dispense: 60 tablet; Refill: 2  Follow-up in months Follow-up therapy Zane FORBES Bach, NP 02/16/2024, 3:23 PM

## 2024-02-16 NOTE — Progress Notes (Cosign Needed)
 Patient came into the office today to get due Invega  Hafyera 1,092mg /3.34ml injection. Patient states that the invega  is working for him.  He state he has no thoughts of hurting himself or other. He was talkative and well dressed.      Pt was given the injection in the Left Upper outer quadrent.  Injection was prepared and given by harlene fridge, cma.   Pt tolerated injection well.   Pt to return in 6 months        [facility] [id number]   Lot # [id number] exp 04-03-2025   SN# [id number] GTTN# [id number]

## 2024-02-16 NOTE — Patient Instructions (Signed)
 Patient came into the office today to get due invega  Hafyera 1,092mg /3.82ml injection. Patient states that the invega  is working for him.  He state he has no thoughts of hurting himself or other. He was talkative and well dressed.      Pt was given the injection in the Left Upper outer quadrent.  Injection was prepared and given by harlene fridge, cma.   Pt tolerated injection well.   Pt to return in 6 months        [facility] [id number]   Lot # [id number] exp 04-03-2025  SN# [id number]  GTTN# [id number]

## 2024-02-17 ENCOUNTER — Other Ambulatory Visit (HOSPITAL_COMMUNITY): Payer: Self-pay | Admitting: Psychiatry

## 2024-02-17 DIAGNOSIS — F250 Schizoaffective disorder, bipolar type: Secondary | ICD-10-CM

## 2024-02-17 MED ORDER — INVEGA HAFYERA 1092 MG/3.5ML IM SUSY: 1092.0000 mg | PREFILLED_SYRINGE | INTRAMUSCULAR | 11 refills | Status: DC

## 2024-02-23 ENCOUNTER — Telehealth (HOSPITAL_COMMUNITY): Payer: Self-pay | Admitting: Psychiatry

## 2024-02-23 NOTE — Telephone Encounter (Signed)
 Patient mother notes that he has some negative symptoms Avolition/Anhedonia and Asociality. She reports that he lacks motivation to do things that he once enjoyed. She reports that he isolates and does not socialize. She notes that she wants him to maybe go back to therapy and request that provider discuss this with him. Provider was agreeable. Patient experienced psychosis with wellburtin. Patients mother asked if other medications to help manage the above can be discussed with her son at his up coming appointment. Provider was agreeable. No other concerns noted at this time.

## 2024-02-24 ENCOUNTER — Other Ambulatory Visit: Payer: Self-pay

## 2024-02-24 ENCOUNTER — Ambulatory Visit (HOSPITAL_COMMUNITY)

## 2024-02-24 ENCOUNTER — Encounter: Payer: Self-pay | Admitting: Allergy & Immunology

## 2024-02-24 ENCOUNTER — Ambulatory Visit (INDEPENDENT_AMBULATORY_CARE_PROVIDER_SITE_OTHER): Admitting: Allergy & Immunology

## 2024-02-24 VITALS — BP 102/68 | HR 116 | Temp 98.6°F | Ht 73.0 in | Wt 339.1 lb

## 2024-02-24 DIAGNOSIS — F250 Schizoaffective disorder, bipolar type: Secondary | ICD-10-CM

## 2024-02-24 DIAGNOSIS — J3089 Other allergic rhinitis: Secondary | ICD-10-CM | POA: Diagnosis not present

## 2024-02-24 DIAGNOSIS — J4550 Severe persistent asthma, uncomplicated: Secondary | ICD-10-CM

## 2024-02-24 MED ORDER — BREZTRI AEROSPHERE 160-9-4.8 MCG/ACT IN AERO: INHALATION_SPRAY | RESPIRATORY_TRACT | 1 refills | Status: AC

## 2024-02-24 NOTE — Progress Notes (Unsigned)
 FOLLOW UP  Date of Service/Encounter:  02/24/24   Assessment:   Moderate persistent asthma, uncomplicated - well controlled   Recent PE - no longer on Eliquis     Allergic rhinoconjunctivitis   Currently on Zepbound   Complicated past medical history including schizoaffective disorder  Plan/Recommendations:   Patient Instructions  Moderate persistent asthma, uncomplicated - Lung testing looked excellent today. - I would try to use your Breztri  at least in the morning to keep your symptoms at bay during the day. - Daily controller medication(s): Breztri  two puffs in the morning - Prior to physical activity: albuterol  2 puffs 10-15 minutes before physical activity. - Rescue medications: albuterol  4 puffs every 4-6 hours as needed - Changes during respiratory infections or worsening symptoms: Increase Breztri  to 2 puffs twice daily for TWO WEEKS. - Asthma control goals:  * Full participation in all desired activities (may need albuterol  before activity) * Albuterol  use two time or less a week on average (not counting use with activity) * Cough interfering with sleep two time or less a month * Oral steroids no more than once a year * No hospitalizations  2. Allergic rhinitis - Continue with Zyrtec  once daily.  - Continue with Flonase  one or two sprays per nostril daily.   3. No follow-ups on file. You can have the follow up appointment with Dr. Iva or a Nurse Practicioner (our Nurse Practitioners are excellent and always have Physician oversight!).    Please inform us  of any Emergency Department visits, hospitalizations, or changes in symptoms. Call us  before going to the ED for breathing or allergy symptoms since we might be able to fit you in for a sick visit. Feel free to contact us  anytime with any questions, problems, or concerns.  It was a pleasure to see you again today!   Websites that have reliable patient information: 1. American Academy of Asthma,  Allergy, and Immunology: www.aaaai.org 2. Food Allergy Research and Education (FARE): foodallergy.org 3. Mothers of Asthmatics: http://www.asthmacommunitynetwork.org 4. American College of Allergy, Asthma, and Immunology: www.acaai.org   COVID-19 Vaccine Information can be found at: [email or web address] For questions related to vaccine distribution or appointments, please email [email or web address] or call [phone].     "Like" us  on Facebook and Instagram for our latest updates!      A healthy democracy works best when [employer] participate! Make sure you are registered to vote! If you have moved or changed any of your contact information, you will need to get this updated before voting! Scan the QR codes below to learn more!           Subjective:   Nathaniel [name] is a 23 y.o. male presenting today for follow up of  Chief Complaint  Patient presents with  . Asthma, severe persistent, well-controlled  . Other allergic rhinitis  . Follow-up    Nathaniel [name] has a history of the following: Patient Active Problem List   Diagnosis Date Noted  . Acute pulmonary embolism (HCC) 10/09/2022  . Lower extremity weakness 10/09/2022  . Schizoaffective disorder, depressive type (HCC) 10/08/2022  . Insomnia due to mental disorder 10/08/2022  . Suicidal ideations 10/08/2022  . Attention deficit hyperactivity disorder (ADHD), predominantly inattentive type 02/05/2022  . Schizoaffective disorder, bipolar type (HCC) 11/03/2021  . Generalized anxiety disorder 03/14/2020  . Psychoactive substance-induced psychosis (HCC) 03/13/2020  . Obstructive sleep apnea of adult 03/22/2018  . Hypertrophy, nasal, turbinate 03/22/2018  . Sore throat 07/13/2017  . Hypogonadism male 04/02/2015  . Acquired acanthosis  nigricans 11/27/2014  . Pre-diabetes 07/29/2013  . Morbid obesity (HCC) 07/29/2013  . Goiter 07/29/2013  . Dyspepsia 07/29/2013   . Essential hypertension, benign 07/29/2013  . Gynecomastia, male 07/29/2013  . PHARYNGITIS 04/16/2008  . Acute upper respiratory infection 04/16/2008  . INFLUENZA WITH OTHER RESPIRATORY MANIFESTATIONS 02/06/2008  . HERPETIC WHITLOW 12/12/2007  . Herpes simplex virus (HSV) infection 12/12/2007  . MIXED RECEPTIVE-EXPRESSIVE LANGUAGE DISORDER 12/12/2007  . Constipation 12/12/2007  . URTICARIA 12/12/2007  . Fetal and neonatal jaundice 12/12/2007  . CARDIAC FLOW MURMUR 12/12/2007  . Perennial allergic rhinitis 12/09/2007  . Asthma 12/09/2007    History obtained from: chart review and {Persons; PED relatives w/patient:19415::patient}.  Discussed the use of AI scribe software for clinical note transcription with the patient and/or guardian, who gave verbal consent to proceed.  Nathaniel [name] is a 23 y.o. male presenting for {Blank single:19197::a food challenge,a drug challenge,skin testing,a sick visit,an evaluation of ***,a follow up visit}.  Asthma/Respiratory Symptom History: ***  Allergic Rhinitis Symptom History: ***  Food Allergy Symptom History: ***  Skin Symptom History: ***  GERD Symptom History: ***  Infection Symptom History: ***  Otherwise, there have been no changes to his past medical history, surgical history, family history, or social history.    Review of systems otherwise negative other than that mentioned in the HPI.    Objective:   Blood pressure 102/68, pulse (!) 116, temperature 98.6 F (37 C), temperature source Temporal, height 6' 1 (1.854 m), weight (!) 339 lb 1.6 oz (153.8 kg), SpO2 97%. Body mass index is 44.74 kg/m.    Physical Exam   Diagnostic studies:    Spirometry: results normal (FEV1: 3.75/78%, FVC: 4.63/81%, FEV1/FVC: 81%).    Spirometry consistent with normal pattern. {Blank single:19197::Albuterol /Atrovent  nebulizer,Xopenex/Atrovent  nebulizer,Albuterol  nebulizer,Albuterol  four puffs via MDI,Xopenex four puffs via  MDI} treatment given in clinic with {Blank single:19197::significant improvement in FEV1 per ATS criteria,significant improvement in FVC per ATS criteria,significant improvement in FEV1 and FVC per ATS criteria,improvement in FEV1, but not significant per ATS criteria,improvement in FVC, but not significant per ATS criteria,improvement in FEV1 and FVC, but not significant per ATS criteria,no improvement}.  Allergy Studies: {Blank single:19197::none,deferred due to recent antihistamine use,deferred due to insurance stipulations that require a separate visit for testing,labs sent instead, }    {Blank single:19197::Allergy testing results were read and interpreted by myself, documented by clinical staff., }      Nathaniel Shaggy, MD  [facility] 

## 2024-02-24 NOTE — Patient Instructions (Addendum)
 Moderate persistent asthma, uncomplicated - Lung testing looked excellent today. - We are not going to make any changes at this point in time. - Be sure to wash out your mouth after using your Breztri . - Daily controller medication(s): Breztri  two puffs twice daily - Prior to physical activity: albuterol  2 puffs 10-15 minutes before physical activity. - Rescue medications: albuterol  4 puffs every 4-6 hours as needed - Changes during respiratory infections or worsening symptoms: Increase Breztri  to 2 puffs twice daily for TWO WEEKS. - Asthma control goals:  * Full participation in all desired activities (may need albuterol  before activity) * Albuterol  use two time or less a week on average (not counting use with activity) * Cough interfering with sleep two time or less a month * Oral steroids no more than once a year * No hospitalizations  2. Allergic rhinitis - Continue with Zyrtec  once daily.  - Continue with Flonase  one or two sprays per nostril daily.   3. Return in about 6 months (around 08/24/2024). You can have the follow up appointment with Dr. Iva or a Nurse Practicioner (our Nurse Practitioners are excellent and always have Physician oversight!).    Please inform us  of any Emergency Department visits, hospitalizations, or changes in symptoms. Call us  before going to the ED for breathing or allergy symptoms since we might be able to fit you in for a sick visit. Feel free to contact us  anytime with any questions, problems, or concerns.  It was a pleasure to see you again today!  Websites that have reliable patient information: 1. American Academy of Asthma, Allergy, and Immunology: www.aaaai.org 2. Food Allergy Research and Education (FARE): foodallergy.org 3. Mothers of Asthmatics: http://www.asthmacommunitynetwork.org 4. American College of Allergy, Asthma, and Immunology: www.acaai.org      "Like" us  on Facebook and Instagram for our latest updates!      A  healthy democracy works best when [employer] participate! Make sure you are registered to vote! If you have moved or changed any of your contact information, you will need to get this updated before voting! Scan the QR codes below to learn more!

## 2024-02-25 ENCOUNTER — Encounter: Payer: Self-pay | Admitting: Allergy & Immunology

## 2024-03-23 ENCOUNTER — Telehealth (HOSPITAL_COMMUNITY): Admitting: Psychiatry

## 2024-03-24 ENCOUNTER — Telehealth (INDEPENDENT_AMBULATORY_CARE_PROVIDER_SITE_OTHER): Admitting: Psychiatry

## 2024-03-24 ENCOUNTER — Encounter (HOSPITAL_COMMUNITY): Payer: Self-pay | Admitting: Psychiatry

## 2024-03-24 DIAGNOSIS — F250 Schizoaffective disorder, bipolar type: Secondary | ICD-10-CM | POA: Diagnosis not present

## 2024-03-24 MED ORDER — INVEGA HAFYERA 1092 MG/3.5ML IM SUSY: 1092.0000 mg | PREFILLED_SYRINGE | INTRAMUSCULAR | 11 refills | Status: DC

## 2024-03-24 NOTE — Progress Notes (Signed)
 BH MD/PA/NP OP Progress Note Virtual Visit via Video Note  I connected with Nathaniel [name] on 03/24/24 at  8:30 AM EST by a video enabled telemedicine application and verified that I am speaking with the correct person using two identifiers.  Location: Patient: Home Provider: Clinic   I discussed the limitations of evaluation and management by telemedicine and the availability of in person appointments. The patient expressed understanding and agreed to proceed.  I provided 30 minutes of non-face-to-face time during this encounter.     03/24/2024 8:36 AM Nathaniel [name]  MRN:  [id number]  Chief Complaint: Sleep is not good sometimes    HPI:  23 year old male seen today for follow-up psychiatric evaluation. He has a psychiatric history of substance use, tobacco dependence, schizoaffective disorder bipolar type, and anxiety.  He is currently managed on Invega  Hafyera 1092 every 6 months, Seroquel  50 mg nightly as needed, and trazodone  200 mg nightly.  He reports that he stopped taking trazodone . He reports that he also has stopped Seroquel . He informed clinical research associate that Invega  is effective in managing his psychiatric condition.  Today he is well-groomed, pleasant, cooperative, engaged in conversation, and maintaining eye contact.  He informed clinical research associate that sleep is not good sometimes.  He informed clinical research associate that he discontinued Seroquel  as it caused him to have difficulties waking up in the morning.  He also notes that he no longer finds trazodone  effective.  Patient notes that most days he sleeps well but occasionally has poor sleep.    Since discontinuing Wellbutrin  patient notes that he has not experienced hallucinations.  Today he denies SI/HI/AVH, mania, paranoia.  He also notes that his mood is stable and informed writer that he has minimal anxiety and depression.  Today provider conducted GAD-7 and patient scored a 0, at his last visit he scored an 8.  Provider also conducted a PHQ-9 and patient  scored a 3, at his last visit he scored 0.  He endorses adequate appetite.    Patient reports that he lacks motivation to do things that he once enjoyed.  He does note that he tries to walk occasionally with his mother and alone.  He reports that he has suffered from anhedonia since starting Invega .  Provider informed patient that anhedonia was one of the negative effects of schizophrenia.  Provider asked patient if he would be interested in counseling to help cope and encouraged him to do recreational activities.  At this time he is not interested.  Today Seroquel  and trazodone  discontinued as patient finds them ineffective.  Patient informed that this medication could be restarted in the future if needed to help manage sleep.  Provider also informed patient that hydroxyzine  could also be trialed to help manage sleep in the future if needed.  He will continue Invega  as prescribed.  No other concerns noted.   Visit Diagnosis:    ICD-10-CM   1. Schizoaffective disorder, bipolar type (HCC)  F25.0 paliperidone  Palmitate ER (INVEGA  HAFYERA) 1092 MG/3.5ML SUSY             Past Psychiatric History:  substance use, tobacco dependence, schizoaffective disorder bipolar type, and anxiety  Past Medical History:  Past Medical History:  Diagnosis Date   Allergic rhinitis    Chronic tonsillar hypertrophy    ENT did tonsillectomy 2020   Elevated blood pressure reading without diagnosis of hypertension    IFG (impaired fasting glucose) 12/2017   Gluc 107, HbA1c 5.5%   Obesity, Class II, BMI 35-39.9    OSA (  obstructive sleep apnea) 02/2018   Sleep study recommended by Dr. Sylvan considering it.   Pulmonary embolism (HCC) 09/10/2022   Schizoaffective disorder (HCC)    Severe persistent asthma (HCC)    Dr. Tiana    Past Surgical History:  Procedure Laterality Date   TONSILLECTOMY  06/03/2018    Family Psychiatric History:  Denies  Family History:  Family History  Problem Relation Age  of Onset   Heart attack Other    Prostate cancer Other    Hypertension Father    Hypertension Maternal Grandfather    Cancer Maternal Grandfather    Asthma Neg Hx     Social History:  Social History   Socioeconomic History   Marital status: Single    Spouse name: Not on file   Number of children: Not on file   Years of education: Not on file   Highest education level: Not on file  Occupational History   Not on file  Tobacco Use   Smoking status: Former    Current packs/day: 0.25    Types: Cigarettes    Passive exposure: Past   Smokeless tobacco: Never  Vaping Use   Vaping status: Some Days   Substances: Nicotine  Substance and Sexual Activity   Alcohol use: Yes    Comment: Rare   Drug use: Not Currently    Types: Marijuana    Comment: last used June 2024   Sexual activity: Yes  Other Topics Concern   Not on file  Social History Narrative   Single, lives with mom in [place].   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   No T/A/Ds.   Social Drivers of Corporate Investment Banker Strain: Low Risk  (09/03/2023)   Received from [facility]   Overall Financial Resource Strain (CARDIA)    Difficulty of Paying Living Expenses: Not hard at all  Food Insecurity: No Food Insecurity (09/03/2023)   Received from [facility]   Hunger Vital Sign    Within the past 12 months, you worried that your food would run out before you got the money to buy more.: Never true    Within the past 12 months, the food you bought just didn't last and you didn't have money to get more.: Never true  Transportation Needs: No Transportation Needs (09/03/2023)   Received from [facility] - Transportation    Lack of Transportation (Medical): No    Lack of Transportation (Non-Medical): No  Physical Activity: Sufficiently Active (09/03/2023)   Received from [facility]   Exercise Vital Sign    On average, how many days per week do you engage in moderate  to strenuous exercise (like a brisk walk)?: 3 days    On average, how many minutes do you engage in exercise at this level?: 60 min  Stress: No Stress Concern Present (09/03/2023)   Received from [facility] - Occupational Stress Questionnaire    Feeling of Stress : Not at all  Social Connections: Socially Integrated (09/03/2023)   Received from [facility]   Social Network    How would you rate your social network (family, work, friends)?: Good participation with social networks    Allergies:  Allergies  Allergen Reactions   Amoxicillin Shortness Of Breath, Swelling and Other (See Comments)    Metabolic Disorder Labs: Lab Results  Component Value Date   HGBA1C 5.3 07/19/2023   MPG 111.15 03/14/2020  MPG 114 08/27/2014   Lab Results  Component Value Date   PROLACTIN 34.5 (H) 07/19/2023   Lab Results  Component Value Date   CHOL 201 (H) 07/19/2023   TRIG 146 07/19/2023   HDL 34 (L) 07/19/2023   CHOLHDL 5.9 (H) 07/19/2023   VLDL 27 03/14/2020   LDLCALC 140 (H) 07/19/2023   LDLCALC 120 (H) 03/14/2020   Lab Results  Component Value Date   TSH 1.800 07/19/2023   TSH 2.519 03/15/2020    Therapeutic Level Labs: No results found for: LITHIUM No results found for: VALPROATE No results found for: CBMZ  Current Medications: Current Outpatient Medications  Medication Sig Dispense Refill   albuterol  (VENTOLIN  HFA) 108 (90 Base) MCG/ACT inhaler Inhale 2 puffs into the lungs every 4 (four) hours as needed for wheezing or shortness of breath. 18 each 1   budesonide -glycopyrrolate-formoterol  (BREZTRI  AEROSPHERE) 160-9-4.8 MCG/ACT AERO inhaler INHALE 2 PUFFS INTO THE LUNGS TWICE A DAY 3 each 1   cetirizine  (ZYRTEC ) 10 MG tablet Take 1 tablet (10 mg total) by mouth daily. 90 tablet 1   Cholecalciferol  (VITAMIN D-3) 125 MCG (5000 UT) TABS Take 5,000 Units by mouth daily.     doxazosin  (CARDURA ) 1 MG tablet Take 1 mg by mouth  daily.     lisinopril  (ZESTRIL ) 20 MG tablet Take 20 mg by mouth daily.     metoprolol  succinate (TOPROL -XL) 50 MG 24 hr tablet Take 100 mg by mouth 2 (two) times daily.     Omega-3 1400 MG CAPS Take 1,400 mg by mouth daily.     paliperidone  Palmitate ER (INVEGA  HAFYERA) 1092 MG/3.5ML SUSY Inject 3.5 mLs (1,092 mg total) into the muscle every 6 (six) months. 38.4 mL 11   pantoprazole (PROTONIX) 40 MG tablet Take 40 mg by mouth daily.     Semaglutide-Weight Management 1.7 MG/0.75ML SOAJ Inject 1.7 mg into the skin once a week.     No current facility-administered medications for this visit.     Musculoskeletal: Strength & Muscle Tone: within normal limits and telehealth visit Gait & Station: normal, telehealth visit Patient leans: N/A  Psychiatric Specialty Exam: Review of Systems  There were no vitals taken for this visit.There is no height or weight on file to calculate BMI.  General Appearance: Well Groomed  Eye Contact:  Good  Speech:  Clear and Coherent and Normal Rate  Volume:  Normal  Mood:  Euthymic  Affect:  Appropriate and Congruent  Thought Process:  Coherent, Goal Directed, and Linear  Orientation:  Full (Time, Place, and Person)  Thought Content: WDL and Logical   Suicidal Thoughts:  No  Homicidal Thoughts:  No  Memory:  Immediate;   Good Recent;   Good Remote;   Good  Judgement:  Good  Insight:  Good  Psychomotor Activity:  Normal  Concentration:  Concentration: Good and Attention Span: Good  Recall:  Good  Fund of Knowledge: Good  Language: Good  Akathisia:  No  Handed:  Right  AIMS (if indicated): not done  Assets:  Communication Skills Desire for Improvement Financial Resources/Insurance Housing Physical Health Social Support  ADL's:  Intact  Cognition: WNL  Sleep:  Fair   Screenings: Geneticist, Molecular Office Visit from 08/27/2022 in [facility] Office Visit from 05/28/2022 in [facility] Office Visit from 02/26/2022 in [facility] Admission (Discharged) [facility]  AIMS  Total Score 4 0 1 0   AUDIT    Flowsheet Row Admission (Discharged) from [facility]  Alcohol Use Disorder Identification Test Final Score (AUDIT) 1   GAD-7    Flowsheet Row Video Visit from 03/24/2024 in [facility] Video Visit from 02/16/2024 in [facility] Video Visit from 01/19/2024 in [facility] Video Visit from 07/16/2023 in [facility] Video Visit from 05/14/2023 in [facility]  Total GAD-7 Score 0 8 0 0 0   PHQ2-9    Flowsheet Row Video Visit from 03/24/2024 in [facility] Video Visit from 02/16/2024 in [facility] Video Visit from 01/19/2024 in [facility] Video Visit from 07/16/2023 in [facility] Video Visit from 05/14/2023 in [facility]  PHQ-2 Total Score 0 0 2 0 0  PHQ-9 Total Score 3 0 6 0 1   Flowsheet Row Video Visit from 10/22/2022 in [facility] Admission (Discharged) from 10/09/2022 in [facility] from 10/08/2022 in [facility] Emergency Department at [facility]  C-SSRS RISK CATEGORY Error: Q3, 4, or 5 should not be populated when Q2 is No No Risk Moderate Risk     Assessment and Plan: Patient informed writer that at times sleep is problematic.  He however finds trazodone  and Seroquel  ineffective. Today Seroquel  and trazodone  discontinued as patient finds them ineffective.  Patient informed that this medication could be restarted in the future if needed to help manage sleep.  Provider also  informed patient that hydroxyzine  could also be trialed to help manage sleep in the future if needed.  He will continue Invega  as prescribed.  Patient also informed writer that he suffers from anhedonia and notes that he has been experiencing this since being on Invega .  Provider informed patient that anhedonia is a negative symptom of schizophrenia and encouraged recreational activities and counseling.  At this time patient is not interested.  1. Schizoaffective disorder, bipolar type (HCC)  Continue- paliperidone  Palmitate ER (INVEGA  HAFYERA) 1092 MG/3.5ML SUSY; Inject 3.5 mLs (1,092 mg total) into the muscle every 6 (six) months.  Dispense: 38.4 mL; Refill: 11  Follow-up in 2 months  Nathaniel FORBES Bach, NP 03/24/2024, 8:36 AM

## 2024-03-29 ENCOUNTER — Telehealth (HOSPITAL_COMMUNITY): Payer: Self-pay

## 2024-03-29 NOTE — Telephone Encounter (Signed)
 Prior Authorization for Invega  Hafyera 1092mg  was completed. Patient insurance reports  Drug is not covered by plan.   Will follow up at [phone]

## 2024-04-04 NOTE — Telephone Encounter (Signed)
 Thank you for the update.  Nursing staff has started the process of patient care assistance for the medication.

## 2024-04-17 ENCOUNTER — Encounter (HOSPITAL_COMMUNITY): Payer: Self-pay

## 2024-04-17 ENCOUNTER — Ambulatory Visit (HOSPITAL_COMMUNITY): Payer: Self-pay | Admitting: Psychiatry

## 2024-04-17 ENCOUNTER — Ambulatory Visit (INDEPENDENT_AMBULATORY_CARE_PROVIDER_SITE_OTHER)

## 2024-04-17 ENCOUNTER — Other Ambulatory Visit (INDEPENDENT_AMBULATORY_CARE_PROVIDER_SITE_OTHER)

## 2024-04-17 DIAGNOSIS — Z79899 Other long term (current) drug therapy: Secondary | ICD-10-CM | POA: Diagnosis not present

## 2024-04-17 DIAGNOSIS — F250 Schizoaffective disorder, bipolar type: Secondary | ICD-10-CM | POA: Diagnosis not present

## 2024-04-17 DIAGNOSIS — F19959 Other psychoactive substance use, unspecified with psychoactive substance-induced psychotic disorder, unspecified: Secondary | ICD-10-CM

## 2024-04-17 NOTE — Progress Notes (Signed)
 Patient presented to the office for labs, labs were drawn from right [facility] with no issue or complaints . Pt left office alert and ambulatory.

## 2024-04-17 NOTE — Progress Notes (Signed)
 Provider attempted to call patient without success.  Provider left voicemail informing patient that EKG results were within normal limits.  Provider instructed patient to call with further questions or concerns.

## 2024-04-17 NOTE — Progress Notes (Signed)
 Patient presented to office for EKG. Patient tolerated procedure well. No complaints noted. Patient left alert and ambulatory.

## 2024-04-18 ENCOUNTER — Other Ambulatory Visit (HOSPITAL_COMMUNITY): Payer: Self-pay

## 2024-04-18 LAB — THYROID PANEL WITH TSH
Free Thyroxine Index: 2.4 (ref 1.2–4.9)
T3 Uptake Ratio: 28 % (ref 24–39)
T4, Total: 8.4 ug/dL (ref 4.5–12.0)
TSH: 2.32 u[IU]/mL (ref 0.450–4.500)

## 2024-04-18 LAB — CBC WITH DIFFERENTIAL/PLATELET
Basophils Absolute: 0.1 x10E3/uL (ref 0.0–0.2)
Basos: 1 %
EOS (ABSOLUTE): 0.2 x10E3/uL (ref 0.0–0.4)
Eos: 1 %
Hematocrit: 44.1 % (ref 37.5–51.0)
Hemoglobin: 14.4 g/dL (ref 13.0–17.7)
Immature Grans (Abs): 0.1 x10E3/uL (ref 0.0–0.1)
Immature Granulocytes: 1 %
Lymphocytes Absolute: 4.7 x10E3/uL — ABNORMAL HIGH (ref 0.7–3.1)
Lymphs: 43 %
MCH: 29.2 pg (ref 26.6–33.0)
MCHC: 32.7 g/dL (ref 31.5–35.7)
MCV: 90 fL (ref 79–97)
Monocytes Absolute: 0.7 x10E3/uL (ref 0.1–0.9)
Monocytes: 6 %
Neutrophils Absolute: 5.3 x10E3/uL (ref 1.4–7.0)
Neutrophils: 48 %
Platelets: 308 x10E3/uL (ref 150–450)
RBC: 4.93 x10E6/uL (ref 4.14–5.80)
RDW: 13 % (ref 11.6–15.4)
WBC: 11 x10E3/uL — ABNORMAL HIGH (ref 3.4–10.8)

## 2024-04-18 LAB — LIPID PANEL
Chol/HDL Ratio: 6.1 ratio — ABNORMAL HIGH (ref 0.0–5.0)
Cholesterol, Total: 200 mg/dL — ABNORMAL HIGH (ref 100–199)
HDL: 33 mg/dL — ABNORMAL LOW (ref >39)
LDL Chol Calc (NIH): 146 mg/dL — ABNORMAL HIGH (ref 0–99)
Triglycerides: 116 mg/dL (ref 0–149)
VLDL Cholesterol Cal: 21 mg/dL (ref 5–40)

## 2024-04-18 LAB — COMPREHENSIVE METABOLIC PANEL WITH GFR
ALT: 43 IU/L (ref 0–44)
AST: 30 IU/L (ref 0–40)
Albumin: 4.5 g/dL (ref 4.3–5.2)
Alkaline Phosphatase: 52 IU/L (ref 47–123)
BUN/Creatinine Ratio: 14 (ref 9–20)
BUN: 11 mg/dL (ref 6–20)
Bilirubin Total: 0.3 mg/dL (ref 0.0–1.2)
CO2: 19 mmol/L — ABNORMAL LOW (ref 20–29)
Calcium: 9.9 mg/dL (ref 8.7–10.2)
Chloride: 103 mmol/L (ref 96–106)
Creatinine, Ser: 0.76 mg/dL (ref 0.76–1.27)
Globulin, Total: 2.9 g/dL (ref 1.5–4.5)
Glucose: 44 mg/dL — ABNORMAL LOW (ref 70–99)
Potassium: 5.5 mmol/L — ABNORMAL HIGH (ref 3.5–5.2)
Sodium: 139 mmol/L (ref 134–144)
Total Protein: 7.4 g/dL (ref 6.0–8.5)
eGFR: 128 mL/min/1.73 (ref >59)

## 2024-04-18 LAB — PROLACTIN: Prolactin: 43.4 ng/mL — ABNORMAL HIGH (ref 3.6–31.5)

## 2024-04-18 LAB — HEPATIC FUNCTION PANEL: Bilirubin, Direct: <0.08 mg/dL (ref 0.00–0.40)

## 2024-04-18 LAB — HEMOGLOBIN A1C
Est. average glucose Bld gHb Est-mCnc: 108 mg/dL
Hgb A1c MFr Bld: 5.4 % (ref 4.8–5.6)

## 2024-04-19 ENCOUNTER — Telehealth (HOSPITAL_COMMUNITY): Admitting: Psychiatry

## 2024-04-20 ENCOUNTER — Telehealth (HOSPITAL_COMMUNITY): Payer: Self-pay

## 2024-04-20 NOTE — Telephone Encounter (Signed)
 Please call patient back about lab results when time allows.

## 2024-04-25 ENCOUNTER — Ambulatory Visit (HOSPITAL_COMMUNITY): Payer: Self-pay | Admitting: Psychiatry

## 2024-04-25 NOTE — Progress Notes (Signed)
 Provider attempted to call patient three times without success. Voicemail left regarding labs. Patient instructed to call the clinic with further concerns.

## 2024-04-25 NOTE — Telephone Encounter (Signed)
 Provider attempted to call patient three times without success. Voicemail left regarding labs. Patient instructed to call the clinic with further concerns.

## 2024-05-19 ENCOUNTER — Encounter (HOSPITAL_COMMUNITY): Payer: Self-pay | Admitting: Psychiatry

## 2024-05-19 ENCOUNTER — Telehealth (INDEPENDENT_AMBULATORY_CARE_PROVIDER_SITE_OTHER): Payer: Self-pay | Admitting: Psychiatry

## 2024-05-19 DIAGNOSIS — F250 Schizoaffective disorder, bipolar type: Secondary | ICD-10-CM | POA: Diagnosis not present

## 2024-05-19 MED ORDER — INVEGA HAFYERA 1092 MG/3.5ML IM SUSY: 1092.0000 mg | PREFILLED_SYRINGE | INTRAMUSCULAR | 11 refills | Status: AC

## 2024-05-19 NOTE — Progress Notes (Signed)
 BH MD/PA/NP OP Progress Note Virtual Visit via Video Note  I connected with Nathaniel [name] on 05/19/24 at  8:30 AM EST by a video enabled telemedicine application and verified that I am speaking with the correct person using two identifiers.  Location: Patient: Home Provider: Clinic   I discussed the limitations of evaluation and management by telemedicine and the availability of in person appointments. The patient expressed understanding and agreed to proceed.  I provided 30 minutes of non-face-to-face time during this encounter.     05/19/2024 9:24 AM Nathaniel [name]  MRN:  [id number]  Chief Complaint: I lack motivation    HPI:  24 year old male seen today for follow-up psychiatric evaluation. He has a psychiatric history of substance use, tobacco dependence, schizoaffective disorder bipolar type, and anxiety.  He is currently managed on Invega  Hafyera 1092 every 6 months.  He informed clinical research associate that Invega  is somewhat effective in managing his psychiatric condition.  Today he is well-groomed, pleasant, cooperative, engaged in conversation, and maintaining eye contact.  He informed clinical research associate that he lacks motivation. He reports that he has been trying to push himself and has been walking more. He also notes that he has been inquiring about different jobs. At times he notes that he feels depressed. He reports that he feels flat and has more downs than up. He also notes that his appetite has increased and feels that its caused by the Invega .  Provider informed patient that he is describing emotional blunting which can be caused by Invega .  Provider informed patient that generally lowering the dose of Invega , adding an antidepressant, or therapy may assist with his flat feeling.  Patient reports that he does not wish to restart antidepressant.  He notes that therapy was effective in the past but at this time does not wish to start it.  Patient notes that he does not wish to lower his Invega  dose either.   Patient asked clinical research associate about Cobenfi.  Provider informed patient that Nancyann is a medication that is FDA approved for schizophrenia and informed him that it might alleviate his symptoms but also informed him that he has trialed other antipsychotics (Zyprexa , Seroquel , Risperdal , and Haldol )  in the past without success and is uncertain if Cobenfi will improve his motivation.  Patient endorsed understanding and reports that Invega  has kept his mood stable, and him out of the hospital, and notes that he has been able to maintain a job.    Patient reports that h his anxiety and depression are well-managed although notes at times he becomes depressed.   Today provider conducted GAD-7 and patient scored a 0, at his last visit he scored an 0.  Provider also conducted a PHQ-9 and patient scored a 2, at his last visit he scored 3.  He endorses increased appetite despite being on a GLP-1.  He also notes that his sleep is adequate.  Patient informed writer that when he is in the park walking he believes he sees hallucinations on the ground.  He reports that when he looks up he sees feathers.   Patient denies having these hallucinations in other settings.  Today he denies SI/HI/AH, mania, or paranoia.  Patient reports that he lacks motivation to do things that he once enjoyed.  He describes feeling flat.  Provider informed patient that Invega  can cause emotional blunting.  Provider recommended lowering Invega  dose, trialing therapy, or trialing an antidepressant.  At this time he is not interested in any of those options.  Patient informed  writer that he may be interested in trialing Cobenfi in the future.  Provider endorse understanding.   He will continue Invega  as prescribed.  No medication changes made today.  Patient agreeable to continue medication as prescribed.  No other concerns noted.   Visit Diagnosis:    ICD-10-CM   1. Schizoaffective disorder, bipolar type (HCC)  F25.0 paliperidone  Palmitate ER (INVEGA   HAFYERA) 1092 MG/3.5ML SUSY              Past Psychiatric History:  substance use, tobacco dependence, schizoaffective disorder bipolar type, and anxiety  Past Medical History:  Past Medical History:  Diagnosis Date   Allergic rhinitis    Chronic tonsillar hypertrophy    ENT did tonsillectomy 2020   Elevated blood pressure reading without diagnosis of hypertension    IFG (impaired fasting glucose) 12/2017   Gluc 107, HbA1c 5.5%   Obesity, Class II, BMI 35-39.9    OSA (obstructive sleep apnea) 02/2018   Sleep study recommended by Dr. Sylvan considering it.   Pulmonary embolism (HCC) 09/10/2022   Schizoaffective disorder (HCC)    Severe persistent asthma (HCC)    Dr. Tiana    Past Surgical History:  Procedure Laterality Date   TONSILLECTOMY  06/03/2018    Family Psychiatric History:  Denies  Family History:  Family History  Problem Relation Age of Onset   Heart attack Other    Prostate cancer Other    Hypertension Father    Hypertension Maternal Grandfather    Cancer Maternal Grandfather    Asthma Neg Hx     Social History:  Social History   Socioeconomic History   Marital status: Single    Spouse name: Not on file   Number of children: Not on file   Years of education: Not on file   Highest education level: Not on file  Occupational History   Not on file  Tobacco Use   Smoking status: Former    Current packs/day: 0.25    Types: Cigarettes    Passive exposure: Past   Smokeless tobacco: Never  Vaping Use   Vaping status: Some Days   Substances: Nicotine  Substance and Sexual Activity   Alcohol use: Yes    Comment: Rare   Drug use: Not Currently    Types: Marijuana    Comment: last used June 2024   Sexual activity: Yes  Other Topics Concern   Not on file  Social History Narrative   Single, lives with mom in [place].   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   No T/A/Ds.   Social Drivers of Health    Tobacco Use: Medium Risk (04/17/2024)   Patient History    Smoking Tobacco Use: Former    Smokeless Tobacco Use: Never    Passive Exposure: Past  Physicist, Medical Strain: Low Risk (09/03/2023)   Received from [facility]   Overall Financial Resource Strain (CARDIA)    Difficulty of Paying Living Expenses: Not hard at all  Food Insecurity: No Food Insecurity (09/03/2023)   Received from [facility]   Epic    Within the past 12 months, you worried that your food would run out before you got the money to buy more.: Never true    Within the past 12 months, the food you bought just didn't last and you didn't have money to get more.: Never true  Transportation Needs: No Transportation Needs (09/03/2023)   Received from [facility]   PRAPARE -  Administrator, Civil Service (Medical): No    Lack of Transportation (Non-Medical): No  Physical Activity: Sufficiently Active (09/03/2023)   Received from [facility]   Exercise Vital Sign    On average, how many days per week do you engage in moderate to strenuous exercise (like a brisk walk)?: 3 days    On average, how many minutes do you engage in exercise at this level?: 60 min  Stress: No Stress Concern Present (09/03/2023)   Received from [facility] - Occupational Stress Questionnaire    Feeling of Stress : Not at all  Social Connections: Socially Integrated (09/03/2023)   Received from [facility]   Social Network    How would you rate your social network (family, work, friends)?: Good participation with social networks  Depression (PHQ2-9): Low Risk (05/19/2024)   Depression (PHQ2-9)    PHQ-2 Score: 2  Alcohol Screen: Low Risk (10/08/2022)   Alcohol Screen    Last Alcohol Screening Score (AUDIT): 0  Housing: Low Risk (09/03/2023)   Received from [facility]    In the last 12 months, was there a time when you were not able to pay the mortgage or rent on time?: No    In  the past 12 months, how many times have you moved where you were living?: 0    At any time in the past 12 months, were you homeless or living in a shelter (including now)?: No  Utilities: Not At Risk (09/03/2023)   Received from [facility] Utilities    Threatened with loss of utilities: No  Health Literacy: Not on file    Allergies:  Allergies  Allergen Reactions   Amoxicillin Shortness Of Breath, Swelling and Other (See Comments)    Metabolic Disorder Labs: Lab Results  Component Value Date   HGBA1C 5.4 04/17/2024   MPG 111.15 03/14/2020   MPG 114 08/27/2014   Lab Results  Component Value Date   PROLACTIN 43.4 (H) 04/17/2024   PROLACTIN 34.5 (H) 07/19/2023   Lab Results  Component Value Date   CHOL 200 (H) 04/17/2024   TRIG 116 04/17/2024   HDL 33 (L) 04/17/2024   CHOLHDL 6.1 (H) 04/17/2024   VLDL 27 03/14/2020   LDLCALC 146 (H) 04/17/2024   LDLCALC 140 (H) 07/19/2023   Lab Results  Component Value Date   TSH 2.320 04/17/2024   TSH 1.800 07/19/2023    Therapeutic Level Labs: No results found for: LITHIUM No results found for: VALPROATE No results found for: CBMZ  Current Medications: Current Outpatient Medications  Medication Sig Dispense Refill   albuterol  (VENTOLIN  HFA) 108 (90 Base) MCG/ACT inhaler Inhale 2 puffs into the lungs every 4 (four) hours as needed for wheezing or shortness of breath. 18 each 1   budesonide -glycopyrrolate-formoterol  (BREZTRI  AEROSPHERE) 160-9-4.8 MCG/ACT AERO inhaler INHALE 2 PUFFS INTO THE LUNGS TWICE A DAY 3 each 1   cetirizine  (ZYRTEC ) 10 MG tablet Take 1 tablet (10 mg total) by mouth daily. 90 tablet 1   Cholecalciferol  (VITAMIN D-3) 125 MCG (5000 UT) TABS Take 5,000 Units by mouth daily.     doxazosin  (CARDURA ) 1 MG tablet Take 1 mg by mouth daily.     lisinopril  (ZESTRIL ) 20 MG tablet Take 20 mg by mouth daily.     metoprolol  succinate (TOPROL -XL) 50 MG 24 hr tablet Take 100 mg by mouth 2 (two) times daily.  Omega-3 1400 MG CAPS Take 1,400 mg by mouth daily.     paliperidone  Palmitate ER (INVEGA  HAFYERA) 1092 MG/3.5ML SUSY Inject 3.5 mLs (1,092 mg total) into the muscle every 6 (six) months. 38.4 mL 11   pantoprazole (PROTONIX) 40 MG tablet Take 40 mg by mouth daily.     Semaglutide-Weight Management 1.7 MG/0.75ML SOAJ Inject 1.7 mg into the skin once a week.     No current facility-administered medications for this visit.     Musculoskeletal: Strength & Muscle Tone: within normal limits and telehealth visit Gait & Station: normal, telehealth visit Patient leans: N/A  Psychiatric Specialty Exam: Review of Systems  There were no vitals taken for this visit.There is no height or weight on file to calculate BMI.  General Appearance: Well Groomed  Eye Contact:  Good  Speech:  Clear and Coherent and Normal Rate  Volume:  Normal  Mood:  Euthymic  Affect:  Appropriate and Congruent  Thought Process:  Coherent, Goal Directed, and Linear  Orientation:  Full (Time, Place, and Person)  Thought Content: WDL and Logical   Suicidal Thoughts:  No  Homicidal Thoughts:  No  Memory:  Immediate;   Good Recent;   Good Remote;   Good  Judgement:  Good  Insight:  Good  Psychomotor Activity:  Normal  Concentration:  Concentration: Good and Attention Span: Good  Recall:  Good  Fund of Knowledge: Good  Language: Good  Akathisia:  No  Handed:  Right  AIMS (if indicated): not done  Assets:  Communication Skills Desire for Improvement Financial Resources/Insurance Housing Physical Health Social Support  ADL's:  Intact  Cognition: WNL  Sleep:  Fair   Screenings: Geneticist, Molecular Office Visit from 08/27/2022 in [facility] Office Visit from 05/28/2022 in [facility] Office Visit from 02/26/2022 in [facility] Admission (Discharged) [facility]  AIMS Total Score 4 0 1 0   AUDIT    Flowsheet Row Admission (Discharged) from [facility]  Alcohol Use Disorder Identification Test Final Score (AUDIT) 1   GAD-7    Flowsheet Row Video Visit from 05/19/2024 in [facility] Video Visit from 03/24/2024 in [facility] Video Visit from 02/16/2024 in [facility] Video Visit from 01/19/2024 in [facility] Video Visit from 07/16/2023 in [facility]  Total GAD-7 Score 0 0 8 0 0   PHQ2-9    Flowsheet Row Video Visit from 05/19/2024 in [facility] Video Visit from 03/24/2024 in [facility] Video Visit from 02/16/2024 in [facility] Video Visit from 01/19/2024 in [facility] Video Visit from 07/16/2023 in [facility]  PHQ-2 Total Score 2 0 0 2 0  PHQ-9 Total Score 2 3 0 6 0   Flowsheet Row Video Visit from 10/22/2022 in [facility] Admission (Discharged) from 10/09/2022 in [facility] from 10/08/2022 in [facility] Emergency Department at [facility]  C-SSRS RISK CATEGORY Error: Q3, 4, or 5 should not be populated when Q2 is No No Risk Moderate Risk     Assessment and Plan: Patient reports that he lacks motivation to do things that he once enjoyed.  He describes feeling flat.  Provider informed patient that Invega  can cause emotional blunting.  Provider recommended lowering Invega  dose, trialing therapy, or trialing an antidepressant.  At this time he is not interested in any of those options.  Patient informed writer that he may be interested in trialing Cobenfi in the future.  Provider endorse understanding.   He will continue Invega  as prescribed.  No  medication changes made today.  Patient agreeable to continue medication as prescribed.    1. Schizoaffective disorder, bipolar type (HCC)  Continue- paliperidone  Palmitate ER (INVEGA  HAFYERA) 1092 MG/3.5ML SUSY; Inject 3.5 mLs (1,092 mg total) into the muscle every 6 (six) months.  Dispense: 38.4 mL; Refill: 11  Follow-up in 2 months  Zane FORBES Bach, NP 05/19/2024, 9:24 AM

## 2024-07-20 ENCOUNTER — Telehealth (HOSPITAL_COMMUNITY): Payer: Self-pay | Admitting: Psychiatry

## 2024-08-16 ENCOUNTER — Ambulatory Visit (HOSPITAL_COMMUNITY): Payer: Self-pay

## 2024-08-24 ENCOUNTER — Ambulatory Visit: Admitting: Allergy & Immunology
